# Patient Record
Sex: Male | Born: 1955 | Race: Black or African American | Hispanic: No | Marital: Single | Smoking: Current some day smoker
Health system: Southern US, Community
[De-identification: ages and names within clinical notes are randomized; demographics above are authoritative.]

## PROBLEM LIST (undated history)

## (undated) DIAGNOSIS — R51 Headache: Principal | ICD-10-CM

## (undated) DIAGNOSIS — F1010 Alcohol abuse, uncomplicated: Secondary | ICD-10-CM

## (undated) DIAGNOSIS — R519 Headache, unspecified: Secondary | ICD-10-CM

## (undated) DIAGNOSIS — I10 Essential (primary) hypertension: Secondary | ICD-10-CM

## (undated) DIAGNOSIS — F1410 Cocaine abuse, uncomplicated: Secondary | ICD-10-CM

## (undated) DIAGNOSIS — I639 Cerebral infarction, unspecified: Secondary | ICD-10-CM

## (undated) DIAGNOSIS — Z8679 Personal history of other diseases of the circulatory system: Secondary | ICD-10-CM

## (undated) DIAGNOSIS — Z789 Other specified health status: Secondary | ICD-10-CM

## (undated) HISTORY — DX: Essential (primary) hypertension: I10

## (undated) HISTORY — DX: Cerebral infarction, unspecified: I63.9

## (undated) HISTORY — PX: VENTRICULOPERITONEAL SHUNT: SHX204

## (undated) HISTORY — PX: OTHER SURGICAL HISTORY: SHX169

## (undated) HISTORY — DX: Personal history of other diseases of the circulatory system: Z86.79

## (undated) HISTORY — PX: CRANIOTOMY: SHX93

## (undated) HISTORY — DX: Headache, unspecified: R51.9

## (undated) HISTORY — DX: Headache: R51

## (undated) HISTORY — PX: CAROTID ENDARTERECTOMY: SUR193

---

## 2010-11-05 ENCOUNTER — Emergency Department (HOSPITAL_COMMUNITY): Payer: BC Managed Care – PPO

## 2010-11-05 ENCOUNTER — Emergency Department (HOSPITAL_COMMUNITY)
Admission: EM | Admit: 2010-11-05 | Discharge: 2010-11-06 | Disposition: A | Discharge status: Other Acute Inpatient Hospital | Payer: BC Managed Care – PPO | Attending: Emergency Medicine | Admitting: Emergency Medicine

## 2010-11-05 DIAGNOSIS — IMO0002 Reserved for concepts with insufficient information to code with codable children: Secondary | ICD-10-CM | POA: Insufficient documentation

## 2010-11-05 DIAGNOSIS — F1010 Alcohol abuse, uncomplicated: Secondary | ICD-10-CM | POA: Insufficient documentation

## 2010-11-05 DIAGNOSIS — S02609B Fracture of mandible, unspecified, initial encounter for open fracture: Secondary | ICD-10-CM | POA: Insufficient documentation

## 2010-11-05 DIAGNOSIS — F29 Unspecified psychosis not due to a substance or known physiological condition: Secondary | ICD-10-CM | POA: Insufficient documentation

## 2010-11-05 DIAGNOSIS — I671 Cerebral aneurysm, nonruptured: Secondary | ICD-10-CM | POA: Insufficient documentation

## 2010-11-05 DIAGNOSIS — R4182 Altered mental status, unspecified: Secondary | ICD-10-CM | POA: Insufficient documentation

## 2010-11-05 DIAGNOSIS — I609 Nontraumatic subarachnoid hemorrhage, unspecified: Secondary | ICD-10-CM | POA: Insufficient documentation

## 2010-11-05 DIAGNOSIS — R296 Repeated falls: Secondary | ICD-10-CM | POA: Insufficient documentation

## 2010-11-05 DIAGNOSIS — S0180XA Unspecified open wound of other part of head, initial encounter: Secondary | ICD-10-CM | POA: Insufficient documentation

## 2010-11-05 DIAGNOSIS — R6884 Jaw pain: Secondary | ICD-10-CM | POA: Insufficient documentation

## 2010-11-05 DIAGNOSIS — R51 Headache: Secondary | ICD-10-CM | POA: Insufficient documentation

## 2010-11-05 LAB — URINALYSIS, ROUTINE W REFLEX MICROSCOPIC
Bilirubin Urine: NEGATIVE
Ketones, ur: NEGATIVE mg/dL
Leukocytes, UA: NEGATIVE
Nitrite: NEGATIVE
Protein, ur: NEGATIVE mg/dL
Urobilinogen, UA: 0.2 mg/dL (ref 0.0–1.0)
pH: 5.5 (ref 5.0–8.0)

## 2010-11-05 LAB — RAPID URINE DRUG SCREEN, HOSP PERFORMED
Amphetamines: NOT DETECTED
Barbiturates: NOT DETECTED
Tetrahydrocannabinol: NOT DETECTED

## 2010-11-06 ENCOUNTER — Emergency Department (HOSPITAL_COMMUNITY): Payer: BC Managed Care – PPO

## 2010-11-06 ENCOUNTER — Encounter (HOSPITAL_COMMUNITY): Payer: Self-pay

## 2010-11-06 LAB — DIFFERENTIAL
Basophils Absolute: 0 10*3/uL (ref 0.0–0.1)
Basophils Relative: 0 % (ref 0–1)
Lymphocytes Relative: 13 % (ref 12–46)
Monocytes Absolute: 1.3 10*3/uL — ABNORMAL HIGH (ref 0.1–1.0)
Neutro Abs: 14.3 10*3/uL — ABNORMAL HIGH (ref 1.7–7.7)
Neutrophils Relative %: 80 % — ABNORMAL HIGH (ref 43–77)

## 2010-11-06 LAB — COMPREHENSIVE METABOLIC PANEL
ALT: 20 U/L (ref 0–53)
Albumin: 3.7 g/dL (ref 3.5–5.2)
Alkaline Phosphatase: 93 U/L (ref 39–117)
BUN: 20 mg/dL (ref 6–23)
Chloride: 97 mEq/L (ref 96–112)
GFR calc Af Amer: >60 mL/min (ref >60)
Glucose, Bld: 127 mg/dL — ABNORMAL HIGH (ref 70–99)
Potassium: 4.1 mEq/L (ref 3.5–5.1)
Sodium: 137 mEq/L (ref 135–145)
Total Bilirubin: 0.2 mg/dL — ABNORMAL LOW (ref 0.3–1.2)
Total Protein: 8.4 g/dL — ABNORMAL HIGH (ref 6.0–8.3)

## 2010-11-06 LAB — CBC
HCT: 41.4 % (ref 39.0–52.0)
Hemoglobin: 14.5 g/dL (ref 13.0–17.0)
MCHC: 35 g/dL (ref 30.0–36.0)
WBC: 17.9 10*3/uL — ABNORMAL HIGH (ref 4.0–10.5)

## 2010-11-06 LAB — BLOOD GAS, ARTERIAL
Bicarbonate: 23.1 mEq/L (ref 20.0–24.0)
FIO2: 1 %
Patient temperature: 98.6
TCO2: 20.5 mmol/L (ref 0–100)
pCO2 arterial: 44.4 mmHg (ref 35.0–45.0)
pCO2 arterial: 36.3 mmHg (ref 35.0–45.0)
pH, Arterial: 7.404 (ref 7.350–7.450)
pH, Arterial: 7.42 (ref 7.350–7.450)
pO2, Arterial: 526 mmHg — ABNORMAL HIGH (ref 80.0–100.0)

## 2010-11-06 LAB — APTT: aPTT: 31 seconds (ref 24–37)

## 2010-11-06 LAB — PROTIME-INR: INR: 0.93 (ref 0.00–1.49)

## 2010-12-31 ENCOUNTER — Emergency Department (HOSPITAL_COMMUNITY)
Admission: EM | Admit: 2010-12-31 | Discharge: 2011-01-01 | Disposition: A | Discharge status: Home / Self Care | Payer: BC Managed Care – PPO | Attending: Emergency Medicine | Admitting: Emergency Medicine

## 2010-12-31 DIAGNOSIS — I10 Essential (primary) hypertension: Secondary | ICD-10-CM | POA: Insufficient documentation

## 2010-12-31 DIAGNOSIS — T46905A Adverse effect of unspecified agents primarily affecting the cardiovascular system, initial encounter: Secondary | ICD-10-CM | POA: Insufficient documentation

## 2010-12-31 DIAGNOSIS — T783XXA Angioneurotic edema, initial encounter: Secondary | ICD-10-CM | POA: Insufficient documentation

## 2010-12-31 DIAGNOSIS — Z9889 Other specified postprocedural states: Secondary | ICD-10-CM | POA: Insufficient documentation

## 2013-02-18 ENCOUNTER — Ambulatory Visit: Payer: BC Managed Care – PPO

## 2013-03-23 ENCOUNTER — Ambulatory Visit: Payer: Self-pay

## 2014-06-19 ENCOUNTER — Telehealth (HOSPITAL_COMMUNITY): Payer: Self-pay | Admitting: Internal Medicine

## 2014-06-19 NOTE — Telephone Encounter (Signed)
Received records from [facility] for appointment on 07/07/14 with Dr Rennis GoldenHilty.  [facility] for Dr Blanchie DessertHilty's schedule on 07/07/14.  lp

## 2014-06-21 ENCOUNTER — Encounter: Payer: Self-pay | Admitting: Neurology

## 2014-06-22 ENCOUNTER — Encounter: Payer: Self-pay | Admitting: Neurology

## 2014-06-22 ENCOUNTER — Ambulatory Visit (INDEPENDENT_AMBULATORY_CARE_PROVIDER_SITE_OTHER): Payer: PRIVATE HEALTH INSURANCE | Admitting: Neurology

## 2014-06-22 VITALS — BP 171/101 | HR 61 | Ht 69.0 in | Wt 144.0 lb

## 2014-06-22 DIAGNOSIS — R51 Headache: Secondary | ICD-10-CM

## 2014-06-22 DIAGNOSIS — R519 Headache, unspecified: Secondary | ICD-10-CM

## 2014-06-22 DIAGNOSIS — Z8679 Personal history of other diseases of the circulatory system: Secondary | ICD-10-CM | POA: Diagnosis not present

## 2014-06-22 HISTORY — DX: Personal history of other diseases of the circulatory system: Z86.79

## 2014-06-22 HISTORY — DX: Headache, unspecified: R51.9

## 2014-06-22 MED ORDER — NORTRIPTYLINE HCL 10 MG PO CAPS: ORAL_CAPSULE | ORAL | Status: DC

## 2014-06-22 NOTE — Progress Notes (Signed)
Reason for visit: Headache  Referring physician: Vocational rehabilitation  Leonard Forbes is a 59 y.o. 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 The patient was transferred to [facility] for surgical therapy, he required a craniotomy with an aneurysm clipping procedure. At the time of the intracranial hemorrhage, the patient fell and fractured his left mandible requiring surgery. The patient also eventually required a VP shunt placement for obstructive hydrocephalus. Since the event, the patient apparently sustained a stroke following the aneurysm rupture. The patient has had minimal residual with numbness or weakness on the extremities, but he has had some cognitive impairment following the subarachnoid bleed. The patient also reports headaches that are frequent, he indicates that previously the headaches were occurring 4 or 5 times a week, but more recently over the last 1 or 2 months, the headaches are occurring 3 times a week. The headaches at times may last 4-5 days. He may have 8-10 headache free days a month at this point. He indicates that the headaches are usually in the left occipital area and spreading down to the left shoulder and neck. He describes a dull achy sensation, no sharp shooting pains. He does not have nausea or vomiting, he may have some slight dizziness, and he has some blurred vision. At times, the headaches are severe enough to keep him from functioning well. He takes ibuprofen for the headache. He does not have a primary care physician, and he is referred by vocational rehabilitation. 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 The patient denies any balance issues or difficulty controlling the bowels or the bladder.  Past Medical History  Diagnosis Date  . Headache   . Hypertension   . Stroke   . Headache  disorder 06/22/2014  . History of ruptured arterial aneurysm 06/22/2014    Past Surgical History  Procedure Laterality Date  . Craniotomy      with clipping  . Ventriculoperitoneal shunt    . Carotid endarterectomy    . Orif mandible Left     Family History  Problem Relation Age of Onset  . Multiple sclerosis Mother   . Dementia Father   . Stroke Sister     Social history:  reports that he has been smoking.  He has never used smokeless tobacco. He reports that he drinks alcohol. He reports that he does not use illicit drugs.  Medications:  Prior to Admission medications   Medication Sig Start Date End Date Taking? Authorizing Provider  ibuprofen (ADVIL,MOTRIN) 200 MG tablet Take 200 mg by mouth every 6 (six) hours as needed.    Historical Provider, MD      Allergies  Allergen Reactions  . Aspirin Other (See Comments)    Stomach ulcer    ROS:  Out of a complete 14 system review of symptoms, the patient complains only of the following symptoms, and all other reviewed systems are negative.  Headache Memory problems Shoulder pain  Blood pressure 171/101, pulse 61, height  (1.753 m), weight 144 lb (65.318 kg).  Physical Exam  General: The patient is alert and cooperative at the time of the examination.  Eyes: Pupils are equal, round, and reactive to light. Discs are flat bilaterally.  Neck: The neck is supple, no carotid bruits are noted.  Respiratory: The respiratory examination is clear.  Cardiovascular: The cardiovascular  examination reveals a regular rate and rhythm, no obvious murmurs or rubs are noted.  Neuromuscular: Range of movement of the cervical spine is full.  Skin: Extremities are without significant edema.  Neurologic Exam  Mental status: The patient is alert and oriented x 3 at the time of the examination. The patient has apparent normal recent and remote memory, with an apparently normal attention span and concentration ability.  Mini-Mental Status Examination done today shows a total score of 30/30.  Cranial nerves: Facial symmetry is present. There is good sensation of the face to pinprick and soft touch bilaterally. The strength of the facial muscles and the muscles to head turning and shoulder shrug are normal bilaterally. Speech is well enunciated, no aphasia or dysarthria is noted. Extraocular movements are full. Visual fields are full. The tongue is midline, and the patient has symmetric elevation of the soft palate. No obvious hearing deficits are noted.  Motor: The motor testing reveals 5 over 5 strength of all 4 extremities. Good symmetric motor tone is noted throughout.  Sensory: Sensory testing is intact to pinprick, soft touch, vibration sensation, and position sense on all 4 extremities. No evidence of extinction is noted.  Coordination: Cerebellar testing reveals good finger-nose-finger and heel-to-shin bilaterally.  Gait and station: Gait is normal. Tandem gait is minimally unsteady. Romberg is negative. No drift is seen.  Reflexes: Deep tendon reflexes are symmetric and normal bilaterally. Toes are downgoing bilaterally.   CT head 11/06/10:  IMPRESSION: Large amount of subarachnoid hemorrhage. This is relatively symmetric. There is intraventricular hemorrhage with hydrocephalus.  This pattern could be seen with ruptured cerebral aneurysm. This could also be traumatic however would be somewhat unusual without subdural hemorrhage or external head trauma. Further evaluation with CTA head is suggested to evaluate for aneurysm.  * CT scan images were reviewed online. I agree with the written report.    Assessment/Plan:  1. History of subarachnoid hemorrhage, right PCOM aneurysm  2. Headaches, left shoulder pain  3. Cognitive impairment following subarachnoid average  The patient has minimal physical deficits, but he likely has sustained some cognitive changes following the subarachnoid  hemorrhage. He also reports problems with frequent headaches that are impairing his ability to function. The patient is taking ibuprofen if needed for the headache. He will be placed on nortriptyline in low dose, gradually increasing the dosing. If the headaches do not improve, a repeat CT scan of the brain may be done. The patient will follow-up in 3 months.  Marlan Palau. Keith Willis MD 06/22/2014 9:56 PM  [facility] [address] [place] [id number]  Phone [phone] Fax [phone]

## 2014-06-22 NOTE — Patient Instructions (Signed)
Pamelor (nortriptyline) is an antidepressant medication that has many uses that may include headache, whiplash injuries, or for peripheral neuropathy pain. Side effects may include drowsiness, dry mouth, blurred vision, or constipation. As with any antidepressant medication, worsening depression may occur. If you had any significant side effects, please call our office. The full effects of this medication may take 7-10 days after starting the drug, or going up on the dose.    Headaches, Frequently Asked Questions MIGRAINE HEADACHES Q: What is migraine? What causes it? How can I treat it? A: Generally, migraine headaches begin as a dull ache. Then they develop into a constant, throbbing, and pulsating pain. You may experience pain at the temples. You may experience pain at the front or back of one or both sides of the head. The pain is usually accompanied by a combination of:  Nausea.  Vomiting.  Sensitivity to light and noise. Some people (about 15%) experience an aura (see below) before an attack. The cause of migraine is believed to be chemical reactions in the brain. Treatment for migraine may include over-the-counter or prescription medications. It may also include self-help techniques. These include relaxation training and biofeedback.  Q: What is an aura? A: About 15% of people with migraine get an "aura". This is a sign of neurological symptoms that occur before a migraine headache. You may see wavy or jagged lines, dots, or flashing lights. You might experience tunnel vision or blind spots in one or both eyes. The aura can include visual or auditory hallucinations (something imagined). It may include disruptions in smell (such as strange odors), taste or touch. Other symptoms include:  Numbness.  A "pins and needles" sensation.  Difficulty in recalling or speaking the correct word. These neurological events may last as long as 60 minutes. These symptoms will fade as the headache  begins. Q: What is a trigger? A: Certain physical or environmental factors can lead to or "trigger" a migraine. These include:  Foods.  Hormonal changes.  Weather.  Stress. It is important to remember that triggers are different for everyone. To help prevent migraine attacks, you need to figure out which triggers affect you. Keep a headache diary. This is a good way to track triggers. The diary will help you talk to your healthcare professional about your condition. Q: Does weather affect migraines? A: Bright sunshine, hot, humid conditions, and drastic changes in barometric pressure may lead to, or "trigger," a migraine attack in some people. But studies have shown that weather does not act as a trigger for everyone with migraines. Q: What is the link between migraine and hormones? A: Hormones start and regulate many of your body's functions. Hormones keep your body in balance within a constantly changing environment. The levels of hormones in your body are unbalanced at times. Examples are during menstruation, pregnancy, or menopause. That can lead to a migraine attack. In fact, about three quarters of all women with migraine report that their attacks are related to the menstrual cycle.  Q: Is there an increased risk of stroke for migraine sufferers? A: The likelihood of a migraine attack causing a stroke is very remote. That is not to say that migraine sufferers cannot have a stroke associated with their migraines. In persons under age 40, the most common associated factor for stroke is migraine headache. But over the course of a person's normal life span, the occurrence of migraine headache may actually be associated with a reduced risk of dying from cerebrovascular disease due to   stroke.  Q: What are acute medications for migraine? A: Acute medications are used to treat the pain of the headache after it has started. Examples over-the-counter medications, NSAIDs, ergots, and triptans.  Q:  What are the triptans? A: Triptans are the newest class of abortive medications. They are specifically targeted to treat migraine. Triptans are vasoconstrictors. They moderate some chemical reactions in the brain. The triptans work on receptors in your brain. Triptans help to restore the balance of a neurotransmitter called serotonin. Fluctuations in levels of serotonin are thought to be a main cause of migraine.  Q: Are over-the-counter medications for migraine effective? A: Over-the-counter, or "OTC," medications may be effective in relieving mild to moderate pain and associated symptoms of migraine. But you should see your caregiver before beginning any treatment regimen for migraine.  Q: What are preventive medications for migraine? A: Preventive medications for migraine are sometimes referred to as "prophylactic" treatments. They are used to reduce the frequency, severity, and length of migraine attacks. Examples of preventive medications include antiepileptic medications, antidepressants, beta-blockers, calcium channel blockers, and NSAIDs (nonsteroidal anti-inflammatory drugs). Q: Why are anticonvulsants used to treat migraine? A: During the past few years, there has been an increased interest in antiepileptic drugs for the prevention of migraine. They are sometimes referred to as "anticonvulsants". Both epilepsy and migraine may be caused by similar reactions in the brain.  Q: Why are antidepressants used to treat migraine? A: Antidepressants are typically used to treat people with depression. They may reduce migraine frequency by regulating chemical levels, such as serotonin, in the brain.  Q: What alternative therapies are used to treat migraine? A: The term "alternative therapies" is often used to describe treatments considered outside the scope of conventional Western medicine. Examples of alternative therapy include acupuncture, acupressure, and yoga. Another common alternative treatment is  herbal therapy. Some herbs are believed to relieve headache pain. Always discuss alternative therapies with your caregiver before proceeding. Some herbal products contain arsenic and other toxins. TENSION HEADACHES Q: What is a tension-type headache? What causes it? How can I treat it? A: Tension-type headaches occur randomly. They are often the result of temporary stress, anxiety, fatigue, or anger. Symptoms include soreness in your temples, a tightening band-like sensation around your head (a "vice-like" ache). Symptoms can also include a pulling feeling, pressure sensations, and contracting head and neck muscles. The headache begins in your forehead, temples, or the back of your head and neck. Treatment for tension-type headache may include over-the-counter or prescription medications. Treatment may also include self-help techniques such as relaxation training and biofeedback. CLUSTER HEADACHES Q: What is a cluster headache? What causes it? How can I treat it? A: Cluster headache gets its name because the attacks come in groups. The pain arrives with little, if any, warning. It is usually on one side of the head. A tearing or bloodshot eye and a runny nose on the same side of the headache may also accompany the pain. Cluster headaches are believed to be caused by chemical reactions in the brain. They have been described as the most severe and intense of any headache type. Treatment for cluster headache includes prescription medication and oxygen. SINUS HEADACHES Q: What is a sinus headache? What causes it? How can I treat it? A: When a cavity in the bones of the face and skull (a sinus) becomes inflamed, the inflammation will cause localized pain. This condition is usually the result of an allergic reaction, a tumor, or an infection. If your   headache is caused by a sinus blockage, such as an infection, you will probably have a fever. An x-ray will confirm a sinus blockage. Your caregiver's treatment  might include antibiotics for the infection, as well as antihistamines or decongestants.  REBOUND HEADACHES Q: What is a rebound headache? What causes it? How can I treat it? A: A pattern of taking acute headache medications too often can lead to a condition known as "rebound headache." A pattern of taking too much headache medication includes taking it more than 2 days per week or in excessive amounts. That means more than the label or a caregiver advises. With rebound headaches, your medications not only stop relieving pain, they actually begin to cause headaches. Doctors treat rebound headache by tapering the medication that is being overused. Sometimes your caregiver will gradually substitute a different type of treatment or medication. Stopping may be a challenge. Regularly overusing a medication increases the potential for serious side effects. Consult a caregiver if you regularly use headache medications more than 2 days per week or more than the label advises. ADDITIONAL QUESTIONS AND ANSWERS Q: What is biofeedback? A: Biofeedback is a self-help treatment. Biofeedback uses special equipment to monitor your body's involuntary physical responses. Biofeedback monitors:  Breathing.  Pulse.  Heart rate.  Temperature.  Muscle tension.  Brain activity. Biofeedback helps you refine and perfect your relaxation exercises. You learn to control the physical responses that are related to stress. Once the technique has been mastered, you do not need the equipment any more. Q: Are headaches hereditary? A: Four out of five (80%) of people that suffer report a family history of migraine. Scientists are not sure if this is genetic or a family predisposition. Despite the uncertainty, a child has a 50% chance of having migraine if one parent suffers. The child has a 75% chance if both parents suffer.  Q: Can children get headaches? A: By the time they reach high school, most young people have experienced  some type of headache. Many safe and effective approaches or medications can prevent a headache from occurring or stop it after it has begun.  Q: What type of doctor should I see to diagnose and treat my headache? A: Start with your primary caregiver. Discuss his or her experience and approach to headaches. Discuss methods of classification, diagnosis, and treatment. Your caregiver may decide to recommend you to a headache specialist, depending upon your symptoms or other physical conditions. Having diabetes, allergies, etc., may require a more comprehensive and inclusive approach to your headache. The National Headache Foundation will provide, upon request, a list of NHF physician members in your state. Document Released: 06/14/2003 Document Revised: 06/16/2011 Document Reviewed: 11/22/2007 ExitCare Patient Information 2015 [employer]. This information is not intended to replace advice given to you by your health care provider. Make sure you discuss any questions you have with your health care provider.  

## 2014-06-23 ENCOUNTER — Telehealth: Payer: Self-pay | Admitting: Neurology

## 2014-06-23 NOTE — Telephone Encounter (Signed)
Lupita LeashDonna can you send the new patient office visit to vocational rehabilitation.  You may need to call patient to get their phone and fax numbers.  Thank you.

## 2014-06-23 NOTE — Telephone Encounter (Signed)
-----   Message from York Spanielharles K Willis, MD sent at 06/22/2014  9:59 PM EDT ----- This new patient report needs to be sent to vocational rehabilitation. I do not know the fax number.

## 2014-06-27 NOTE — Telephone Encounter (Signed)
Records faxed to vocational rehab 06-26-14.

## 2014-07-07 ENCOUNTER — Ambulatory Visit (INDEPENDENT_AMBULATORY_CARE_PROVIDER_SITE_OTHER): Payer: Self-pay | Admitting: Internal Medicine

## 2014-07-07 ENCOUNTER — Encounter: Payer: Self-pay | Admitting: Internal Medicine

## 2014-07-07 VITALS — BP 140/96 | HR 66 | Ht 69.75 in | Wt 143.6 lb

## 2014-07-07 DIAGNOSIS — R590 Localized enlarged lymph nodes: Secondary | ICD-10-CM

## 2014-07-07 DIAGNOSIS — I10 Essential (primary) hypertension: Secondary | ICD-10-CM

## 2014-07-07 DIAGNOSIS — R599 Enlarged lymph nodes, unspecified: Secondary | ICD-10-CM

## 2014-07-07 DIAGNOSIS — R591 Generalized enlarged lymph nodes: Secondary | ICD-10-CM

## 2014-07-07 MED ORDER — LISINOPRIL 20 MG PO TABS: 20.0000 mg | ORAL_TABLET | Freq: Every day | ORAL | Status: DC

## 2014-07-07 NOTE — Progress Notes (Signed)
OFFICE NOTE  Chief Complaint:  Headaches  Primary Care Physician: No PCP Per Patient  HPI:  Leonard Forbes is a 59 year old male who was referred from vocational rehabilitation for evaluation. It is not exactly clear why he is here. He does have a history of hypertension and was followed by Dr. Concepcion Elk. He takes several antihypertensives but claims he has no money, no health insurance and had his house taken away from him. He is currently living with a friend and has no income. He says he can't afford to take any medications. He was seen by Dr. Anne Hahn in neurology and prescribed amitriptyline for headaches, but he never got the prescription filled due to cost. He is noted now to have elevated blood pressure, which was previously seen. In addition he has a complaint today about an enlarged mass in the left neck under the angle of the jaw. He denies any weight loss, night sweats, fevers or chills. He's had no dental pain and has had some tooth extractions in the past.  PMHx:  Past Medical History  Diagnosis Date  . Headache   . Hypertension   . Stroke   . Headache disorder 06/22/2014  . History of ruptured arterial aneurysm 06/22/2014    Past Surgical History  Procedure Laterality Date  . Craniotomy      with clipping  . Ventriculoperitoneal shunt    . Carotid endarterectomy    . Orif mandible Left     FAMHx:  Family History  Problem Relation Age of Onset  . Multiple sclerosis Mother   . Dementia Father   . Stroke Sister     SOCHx:   reports that he has been smoking.  He has never used smokeless tobacco. He reports that he drinks alcohol. He reports that he does not use illicit drugs.  ALLERGIES:  Allergies  Allergen Reactions  . Aspirin Other (See Comments)    Stomach ulcer    ROS: A comprehensive review of systems was negative except for: Ears, nose, mouth, throat, and face: positive for Neck mass Neurological: positive for headaches  HOME MEDS: Current  Outpatient Prescriptions  Medication Sig Dispense Refill  . cyclobenzaprine (FLEXERIL) 10 MG tablet Take 10 mg by mouth 3 (three) times daily as needed for muscle spasms.    Marland Kitchen ibuprofen (ADVIL,MOTRIN) 200 MG tablet Take 200 mg by mouth every 6 (six) hours as needed.    Marland Kitchen lisinopril (PRINIVIL,ZESTRIL) 20 MG tablet Take 1 tablet (20 mg total) by mouth daily. 30 tablet 6   No current facility-administered medications for this visit.    LABS/IMAGING: No results found for this or any previous visit (from the past 48 hour(s)). No results found.  WEIGHTS: Wt Readings from Last 3 Encounters:  07/07/14 143 lb 9.6 oz (65.137 kg)  06/22/14 144 lb (65.318 kg)    VITALS: BP 140/96 mmHg  Pulse 66  Ht 5' 9.75" (1.772 m)  Wt 143 lb 9.6 oz (65.137 kg)  BMI 20.74 kg/m2  EXAM: General appearance: alert and no distress Neck: marked anterior cervical adenopathy, no carotid bruit and no JVD Lungs: clear to auscultation bilaterally Heart: regular rate and rhythm, S1, S2 normal, no murmur, click, rub or gallop Abdomen: soft, non-tender; bowel sounds normal; no masses,  no organomegaly and scaphoid Extremities: extremities normal, atraumatic, no cyanosis or edema Pulses: 2+ and symmetric Skin: Skin color, texture, turgor normal. No rashes or lesions Neurologic: Grossly normal Psych: Normal  EKG: Normal sinus rhythm at 66, nonspecific T wave  changes, more inferiorly  ASSESSMENT: 1. Hypertension-uncontrolled 2. Headache secondary to prior ruptured cerebral aneurysm 3. Enlarged left submandibular lymph node  PLAN: 1.   Leonard Forbes presents for what he says is a checkup at the request of vocational rehabilitation. He's previously been referred to neurology before and was prescribed amitriptyline, but has not taken the medicine due to lack of financial resources. He says that he previously applied for Medicaid but was denied. It's difficult to understand how his blood pressure could be treated or his  headaches could be managed without being able to afford the medicines he needs to take it. I'm again recommending that he go back on blood pressure medication, namely lisinopril 20 mg daily. In addition he pointed out an enlarged mass on his left neck. Examination reveals what I believe to be a firm, enlarged lymph node. It also could be an abscess. I would recommend an ultrasound of the head and neck, specifically in this area, to further evaluate. Based on the findings he may need to be referred to an ear nose and throat specialist.  He clearly needs referral to social services to establish some type of health insurance or coverage so that he can take medications. I think he can follow-up with his primary care provider at the [facility] for his hypertension.  Chrystie NoseKenneth C. Antwaun Buth, MD, [facility] Attending Cardiologist [facility]  Madisynn Plair C 07/07/2014, 4:51 PM

## 2014-07-07 NOTE — Patient Instructions (Addendum)
Dr. Rennis GoldenHilty has ordered a ultrasound of your head/neck to be done at [facility].   Please contact [phone] for financial assistance There will be some prompted questions for you to go through/answer.    Please starts lisinopril 20mg  once daily for blood pressure This prescription has been sent to the [facility] on [address] It costs $4 for a 30 day supply  Please follow up with Dr. Rennis GoldenHilty as needed.

## 2014-07-14 ENCOUNTER — Ambulatory Visit (HOSPITAL_COMMUNITY): Payer: Self-pay

## 2014-07-18 ENCOUNTER — Ambulatory Visit (HOSPITAL_COMMUNITY)
Admission: RE | Admit: 2014-07-18 | Discharge: 2014-07-18 | Disposition: A | Discharge status: Home / Self Care | Payer: Self-pay | Source: Ambulatory Visit | Attending: Internal Medicine | Admitting: Internal Medicine

## 2014-07-18 DIAGNOSIS — R599 Enlarged lymph nodes, unspecified: Secondary | ICD-10-CM | POA: Insufficient documentation

## 2014-07-25 ENCOUNTER — Other Ambulatory Visit: Payer: Self-pay | Admitting: *Deleted

## 2014-07-25 ENCOUNTER — Telehealth: Payer: Self-pay | Admitting: Internal Medicine

## 2014-07-25 ENCOUNTER — Telehealth: Payer: Self-pay | Admitting: *Deleted

## 2014-07-25 DIAGNOSIS — R221 Localized swelling, mass and lump, neck: Secondary | ICD-10-CM

## 2014-07-25 DIAGNOSIS — Z01812 Encounter for preprocedural laboratory examination: Secondary | ICD-10-CM

## 2014-07-25 NOTE — Telephone Encounter (Signed)
Pt. Informed about his most recent ultrasound and that Dr. Rennis GoldenHilty wants him to have a CT of his neck to evaluate the mass that was identified , pt. Also informed that he will need to get blood work before the CT is done. Pt. Stated understanding of these instructions.  I will put the order in for the CT and the bun/creat

## 2014-07-25 NOTE — Telephone Encounter (Signed)
Pt. Called , [facility]

## 2014-07-25 NOTE — Telephone Encounter (Signed)
[name] is calling to get his results from his ultasound he had 2wks ago . Please call    Thanks

## 2014-07-31 ENCOUNTER — Encounter (HOSPITAL_COMMUNITY): Payer: Self-pay

## 2014-07-31 ENCOUNTER — Ambulatory Visit (HOSPITAL_COMMUNITY)
Admission: RE | Admit: 2014-07-31 | Discharge: 2014-07-31 | Disposition: A | Discharge status: Home / Self Care | Payer: Self-pay | Source: Ambulatory Visit | Attending: Internal Medicine | Admitting: Internal Medicine

## 2014-07-31 DIAGNOSIS — M5032 Other cervical disc degeneration, mid-cervical region: Secondary | ICD-10-CM | POA: Insufficient documentation

## 2014-07-31 DIAGNOSIS — Z982 Presence of cerebrospinal fluid drainage device: Secondary | ICD-10-CM | POA: Insufficient documentation

## 2014-07-31 DIAGNOSIS — R221 Localized swelling, mass and lump, neck: Secondary | ICD-10-CM | POA: Insufficient documentation

## 2014-07-31 LAB — POCT ACTIVATED CLOTTING TIME: ACTIVATED CLOTTING TIME: 122 s

## 2014-07-31 LAB — POCT I-STAT CREATININE: CREATININE: 1.4 mg/dL — AB (ref 0.50–1.35)

## 2014-07-31 MED ORDER — IOHEXOL 300 MG/ML  SOLN
75.0000 mL | Freq: Once | INTRAMUSCULAR | Status: AC | PRN
  Administered 2014-07-31: 75 mL via INTRAVENOUS

## 2014-08-02 ENCOUNTER — Other Ambulatory Visit: Payer: Self-pay | Admitting: *Deleted

## 2014-08-02 DIAGNOSIS — R221 Localized swelling, mass and lump, neck: Secondary | ICD-10-CM

## 2014-09-22 ENCOUNTER — Ambulatory Visit: Payer: PRIVATE HEALTH INSURANCE | Admitting: Nurse Practitioner

## 2014-09-25 ENCOUNTER — Encounter: Payer: Self-pay | Admitting: Nurse Practitioner

## 2014-09-27 ENCOUNTER — Telehealth: Payer: Self-pay | Admitting: *Deleted

## 2014-09-27 NOTE — Telephone Encounter (Signed)
LMVM for pt or wife to return call to reschedule missed appt (09-22-14).

## 2016-03-25 ENCOUNTER — Encounter (HOSPITAL_COMMUNITY): Payer: Self-pay | Admitting: *Deleted

## 2016-03-25 ENCOUNTER — Emergency Department (HOSPITAL_COMMUNITY): Payer: Medicare Other

## 2016-03-25 ENCOUNTER — Ambulatory Visit (INDEPENDENT_AMBULATORY_CARE_PROVIDER_SITE_OTHER)
Admission: EM | Admit: 2016-03-25 | Discharge: 2016-03-25 | Disposition: A | Discharge status: Nursing Home (Includes ICF) | Payer: Medicare Other | Source: Home / Self Care | Attending: Family Medicine | Admitting: Family Medicine

## 2016-03-25 ENCOUNTER — Encounter (HOSPITAL_COMMUNITY): Payer: Self-pay | Admitting: Emergency Medicine

## 2016-03-25 ENCOUNTER — Inpatient Hospital Stay (HOSPITAL_COMMUNITY)
Admission: EM | Admit: 2016-03-25 | Discharge: 2016-04-09 | DRG: 896 | Disposition: A | Discharge status: Skilled Nursing Facility | Payer: Medicare Other | Attending: Internal Medicine | Admitting: Internal Medicine

## 2016-03-25 DIAGNOSIS — K0889 Other specified disorders of teeth and supporting structures: Secondary | ICD-10-CM | POA: Diagnosis present

## 2016-03-25 DIAGNOSIS — G9341 Metabolic encephalopathy: Secondary | ICD-10-CM | POA: Diagnosis present

## 2016-03-25 DIAGNOSIS — I959 Hypotension, unspecified: Secondary | ICD-10-CM | POA: Diagnosis present

## 2016-03-25 DIAGNOSIS — F17200 Nicotine dependence, unspecified, uncomplicated: Secondary | ICD-10-CM | POA: Diagnosis present

## 2016-03-25 DIAGNOSIS — K1379 Other lesions of oral mucosa: Secondary | ICD-10-CM | POA: Diagnosis not present

## 2016-03-25 DIAGNOSIS — E876 Hypokalemia: Secondary | ICD-10-CM | POA: Diagnosis not present

## 2016-03-25 DIAGNOSIS — F1423 Cocaine dependence with withdrawal: Secondary | ICD-10-CM | POA: Diagnosis present

## 2016-03-25 DIAGNOSIS — Y92230 Patient room in hospital as the place of occurrence of the external cause: Secondary | ICD-10-CM | POA: Diagnosis not present

## 2016-03-25 DIAGNOSIS — E440 Moderate protein-calorie malnutrition: Secondary | ICD-10-CM | POA: Insufficient documentation

## 2016-03-25 DIAGNOSIS — F10239 Alcohol dependence with withdrawal, unspecified: Secondary | ICD-10-CM | POA: Diagnosis not present

## 2016-03-25 DIAGNOSIS — R55 Syncope and collapse: Secondary | ICD-10-CM

## 2016-03-25 DIAGNOSIS — F10939 Alcohol use, unspecified with withdrawal, unspecified: Secondary | ICD-10-CM | POA: Diagnosis present

## 2016-03-25 DIAGNOSIS — R4182 Altered mental status, unspecified: Secondary | ICD-10-CM | POA: Diagnosis not present

## 2016-03-25 DIAGNOSIS — Z8673 Personal history of transient ischemic attack (TIA), and cerebral infarction without residual deficits: Secondary | ICD-10-CM

## 2016-03-25 DIAGNOSIS — E860 Dehydration: Secondary | ICD-10-CM | POA: Diagnosis present

## 2016-03-25 DIAGNOSIS — Z820 Family history of epilepsy and other diseases of the nervous system: Secondary | ICD-10-CM

## 2016-03-25 DIAGNOSIS — F10231 Alcohol dependence with withdrawal delirium: Secondary | ICD-10-CM | POA: Diagnosis present

## 2016-03-25 DIAGNOSIS — R64 Cachexia: Secondary | ICD-10-CM | POA: Diagnosis present

## 2016-03-25 DIAGNOSIS — N179 Acute kidney failure, unspecified: Secondary | ICD-10-CM | POA: Diagnosis present

## 2016-03-25 DIAGNOSIS — F10230 Alcohol dependence with withdrawal, uncomplicated: Secondary | ICD-10-CM | POA: Diagnosis not present

## 2016-03-25 DIAGNOSIS — G9340 Encephalopathy, unspecified: Secondary | ICD-10-CM | POA: Diagnosis not present

## 2016-03-25 DIAGNOSIS — R609 Edema, unspecified: Secondary | ICD-10-CM | POA: Diagnosis not present

## 2016-03-25 DIAGNOSIS — Z982 Presence of cerebrospinal fluid drainage device: Secondary | ICD-10-CM | POA: Diagnosis not present

## 2016-03-25 DIAGNOSIS — I493 Ventricular premature depolarization: Secondary | ICD-10-CM | POA: Diagnosis present

## 2016-03-25 DIAGNOSIS — Y900 Blood alcohol level of less than 20 mg/100 ml: Secondary | ICD-10-CM | POA: Diagnosis present

## 2016-03-25 DIAGNOSIS — K1370 Unspecified lesions of oral mucosa: Secondary | ICD-10-CM

## 2016-03-25 DIAGNOSIS — Z886 Allergy status to analgesic agent status: Secondary | ICD-10-CM

## 2016-03-25 DIAGNOSIS — E872 Acidosis, unspecified: Secondary | ICD-10-CM | POA: Diagnosis present

## 2016-03-25 DIAGNOSIS — I472 Ventricular tachycardia: Secondary | ICD-10-CM | POA: Diagnosis present

## 2016-03-25 DIAGNOSIS — I10 Essential (primary) hypertension: Secondary | ICD-10-CM | POA: Diagnosis not present

## 2016-03-25 DIAGNOSIS — R7989 Other specified abnormal findings of blood chemistry: Secondary | ICD-10-CM | POA: Diagnosis present

## 2016-03-25 DIAGNOSIS — Z8679 Personal history of other diseases of the circulatory system: Secondary | ICD-10-CM

## 2016-03-25 DIAGNOSIS — I480 Paroxysmal atrial fibrillation: Secondary | ICD-10-CM | POA: Diagnosis not present

## 2016-03-25 DIAGNOSIS — W06XXXA Fall from bed, initial encounter: Secondary | ICD-10-CM | POA: Diagnosis not present

## 2016-03-25 DIAGNOSIS — T50901A Poisoning by unspecified drugs, medicaments and biological substances, accidental (unintentional), initial encounter: Secondary | ICD-10-CM | POA: Diagnosis not present

## 2016-03-25 DIAGNOSIS — I6203 Nontraumatic chronic subdural hemorrhage: Secondary | ICD-10-CM | POA: Diagnosis present

## 2016-03-25 DIAGNOSIS — R0600 Dyspnea, unspecified: Secondary | ICD-10-CM

## 2016-03-25 DIAGNOSIS — Z681 Body mass index (BMI) 19 or less, adult: Secondary | ICD-10-CM

## 2016-03-25 DIAGNOSIS — F1410 Cocaine abuse, uncomplicated: Secondary | ICD-10-CM

## 2016-03-25 DIAGNOSIS — R05 Cough: Secondary | ICD-10-CM

## 2016-03-25 DIAGNOSIS — G3184 Mild cognitive impairment, so stated: Secondary | ICD-10-CM | POA: Diagnosis present

## 2016-03-25 DIAGNOSIS — Z823 Family history of stroke: Secondary | ICD-10-CM

## 2016-03-25 DIAGNOSIS — R059 Cough, unspecified: Secondary | ICD-10-CM

## 2016-03-25 DIAGNOSIS — F1490 Cocaine use, unspecified, uncomplicated: Secondary | ICD-10-CM | POA: Diagnosis present

## 2016-03-25 DIAGNOSIS — I6200 Nontraumatic subdural hemorrhage, unspecified: Secondary | ICD-10-CM | POA: Diagnosis not present

## 2016-03-25 DIAGNOSIS — T39311A Poisoning by propionic acid derivatives, accidental (unintentional), initial encounter: Secondary | ICD-10-CM | POA: Diagnosis present

## 2016-03-25 DIAGNOSIS — E875 Hyperkalemia: Secondary | ICD-10-CM | POA: Diagnosis present

## 2016-03-25 LAB — MAGNESIUM: Magnesium: 1.9 mg/dL (ref 1.7–2.4)

## 2016-03-25 LAB — I-STAT TROPONIN, ED: TROPONIN I, POC: 0 ng/mL (ref 0.00–0.08)

## 2016-03-25 LAB — URINALYSIS, ROUTINE W REFLEX MICROSCOPIC
BILIRUBIN URINE: NEGATIVE
Bacteria, UA: NONE SEEN
Glucose, UA: NEGATIVE mg/dL
Hgb urine dipstick: NEGATIVE
Ketones, ur: 20 mg/dL — AB
LEUKOCYTES UA: NEGATIVE
NITRITE: NEGATIVE
PH: 5 (ref 5.0–8.0)
Protein, ur: 30 mg/dL — AB
SPECIFIC GRAVITY, URINE: 1.014 (ref 1.005–1.030)
Squamous Epithelial / LPF: NONE SEEN

## 2016-03-25 LAB — COMPREHENSIVE METABOLIC PANEL
ALT: 13 U/L — AB (ref 17–63)
AST: 19 U/L (ref 15–41)
Albumin: 4.3 g/dL (ref 3.5–5.0)
Alkaline Phosphatase: 95 U/L (ref 38–126)
Anion gap: 12 (ref 5–15)
BUN: 12 mg/dL (ref 6–20)
CALCIUM: 9.9 mg/dL (ref 8.9–10.3)
CO2: 23 mmol/L (ref 22–32)
CREATININE: 1.47 mg/dL — AB (ref 0.61–1.24)
Chloride: 108 mmol/L (ref 101–111)
GFR calc Af Amer: 58 mL/min — ABNORMAL LOW (ref >60)
GFR, EST NON AFRICAN AMERICAN: 50 mL/min — AB (ref >60)
Glucose, Bld: 112 mg/dL — ABNORMAL HIGH (ref 65–99)
POTASSIUM: 5.5 mmol/L — AB (ref 3.5–5.1)
Sodium: 143 mmol/L (ref 135–145)
Total Bilirubin: 1 mg/dL (ref 0.3–1.2)
Total Protein: 8.5 g/dL — ABNORMAL HIGH (ref 6.5–8.1)

## 2016-03-25 LAB — CBC WITH DIFFERENTIAL/PLATELET
BASOS ABS: 0 10*3/uL (ref 0.0–0.1)
Basophils Relative: 0 %
EOS ABS: 0 10*3/uL (ref 0.0–0.7)
Eosinophils Relative: 0 %
HCT: 48.6 % (ref 39.0–52.0)
Hemoglobin: 16.3 g/dL (ref 13.0–17.0)
LYMPHS ABS: 1.4 10*3/uL (ref 0.7–4.0)
Lymphocytes Relative: 9 %
MCH: 30 pg (ref 26.0–34.0)
MCHC: 33.5 g/dL (ref 30.0–36.0)
MCV: 89.5 fL (ref 78.0–100.0)
MONO ABS: 0.8 10*3/uL (ref 0.1–1.0)
Monocytes Relative: 5 %
Neutro Abs: 13 10*3/uL — ABNORMAL HIGH (ref 1.7–7.7)
Neutrophils Relative %: 86 %
PLATELETS: 355 10*3/uL (ref 150–400)
RBC: 5.43 MIL/uL (ref 4.22–5.81)
RDW: 16.2 % — AB (ref 11.5–15.5)
WBC: 15.2 10*3/uL — AB (ref 4.0–10.5)

## 2016-03-25 LAB — I-STAT CG4 LACTIC ACID, ED: LACTIC ACID, VENOUS: 3.26 mmol/L — AB (ref 0.5–1.9)

## 2016-03-25 LAB — ETHANOL

## 2016-03-25 LAB — ACETAMINOPHEN LEVEL: Acetaminophen (Tylenol), Serum: <10 ug/mL — ABNORMAL LOW (ref 10–30)

## 2016-03-25 LAB — RAPID URINE DRUG SCREEN, HOSP PERFORMED
AMPHETAMINES: NOT DETECTED
BENZODIAZEPINES: NOT DETECTED
Barbiturates: NOT DETECTED
COCAINE: POSITIVE — AB
Opiates: NOT DETECTED
Tetrahydrocannabinol: NOT DETECTED

## 2016-03-25 LAB — POTASSIUM: POTASSIUM: 4.7 mmol/L (ref 3.5–5.1)

## 2016-03-25 LAB — SALICYLATE LEVEL: Salicylate Lvl: <7 mg/dL (ref 2.8–30.0)

## 2016-03-25 MED ORDER — ONDANSETRON HCL 4 MG/2ML IJ SOLN: 4.0000 mg | Freq: Four times a day (QID) | INTRAMUSCULAR | Status: DC | PRN

## 2016-03-25 MED ORDER — SODIUM CHLORIDE 0.9 % IV BOLUS (SEPSIS)
1000.0000 mL | Freq: Once | INTRAVENOUS | Status: AC
  Administered 2016-03-25: 1000 mL via INTRAVENOUS

## 2016-03-25 MED ORDER — LORAZEPAM 2 MG/ML IJ SOLN
1.0000 mg | Freq: Once | INTRAMUSCULAR | Status: AC
  Administered 2016-03-25: 1 mg via INTRAVENOUS
  Filled 2016-03-25: qty 1

## 2016-03-25 MED ORDER — SODIUM CHLORIDE 0.9% FLUSH
3.0000 mL | Freq: Two times a day (BID) | INTRAVENOUS | Status: DC
  Administered 2016-03-26 – 2016-03-29 (×4): 3 mL via INTRAVENOUS

## 2016-03-25 MED ORDER — HALOPERIDOL LACTATE 5 MG/ML IJ SOLN
5.0000 mg | Freq: Once | INTRAMUSCULAR | Status: AC
  Administered 2016-03-25: 5 mg via INTRAVENOUS
  Filled 2016-03-25: qty 1

## 2016-03-25 MED ORDER — SODIUM CHLORIDE 0.9% FLUSH
3.0000 mL | Freq: Two times a day (BID) | INTRAVENOUS | Status: DC
  Administered 2016-03-26 – 2016-04-04 (×15): 3 mL via INTRAVENOUS

## 2016-03-25 MED ORDER — DIPHENHYDRAMINE HCL 50 MG/ML IJ SOLN
25.0000 mg | Freq: Once | INTRAMUSCULAR | Status: AC
  Administered 2016-03-25: 25 mg via INTRAVENOUS
  Filled 2016-03-25: qty 1

## 2016-03-25 MED ORDER — ENOXAPARIN SODIUM 40 MG/0.4ML ~~LOC~~ SOLN
40.0000 mg | SUBCUTANEOUS | Status: DC
  Administered 2016-03-26 – 2016-03-27 (×2): 40 mg via SUBCUTANEOUS
  Filled 2016-03-25 (×2): qty 0.4

## 2016-03-25 MED ORDER — LABETALOL HCL 5 MG/ML IV SOLN
10.0000 mg | Freq: Once | INTRAVENOUS | Status: AC
  Administered 2016-03-25: 10 mg via INTRAVENOUS
  Filled 2016-03-25: qty 4

## 2016-03-25 MED ORDER — LORAZEPAM 2 MG/ML IJ SOLN
INTRAMUSCULAR | Status: AC
  Filled 2016-03-25: qty 1

## 2016-03-25 MED ORDER — LORAZEPAM 2 MG/ML IJ SOLN: 1.0000 mg | Freq: Once | INTRAMUSCULAR | Status: DC

## 2016-03-25 MED ORDER — ONDANSETRON HCL 4 MG PO TABS: 4.0000 mg | ORAL_TABLET | Freq: Four times a day (QID) | ORAL | Status: DC | PRN

## 2016-03-25 NOTE — ED Notes (Signed)
Nurse will get labs 

## 2016-03-25 NOTE — ED Notes (Signed)
Upon shift change, this RN entered room to find pt agitated with staff members at bedside. Pt was kicking and throwing arms around, making so sense, disoriented, seems to be actively hallucinating responding to internal stimuli. Dansie PA and Schlossman MD also at bedside.

## 2016-03-25 NOTE — ED Notes (Signed)
Robb Matarrtiz MD (admitting) at bedside

## 2016-03-25 NOTE — ED Provider Notes (Signed)
CSN: [id number]     Arrival date & time 03/25/16  1351 History   First MD Initiated Contact with Patient 03/25/16 1405     Chief Complaint  Patient presents with  . Dental Pain   (Consider location/radiation/quality/duration/timing/severity/associated sxs/prior Treatment) 60 year old male presented to the urgent care complaining of mouth pain. He states in the last 3 hours or so he had taken 16 ibuprofen tablets. He had only been here a few minutes just long enough to get vital signs and as he stood up he dropped to the floor in a stuporous condition. He was placed on the exam table. Vital signs retaken. Vital signs stable. He quickly regained consciousness. IV started, O2 and monitor.      Past Medical History:  Diagnosis Date  . Headache   . Headache disorder 06/22/2014  . 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 Hypertension   . Stroke    Past Surgical History:  Procedure Laterality Date  . CAROTID ENDARTERECTOMY    . CRANIOTOMY     with clipping  . orif mandible Left   . VENTRICULOPERITONEAL SHUNT     Family History  Problem Relation Age of Onset  . Multiple sclerosis Mother   . Dementia Father   . Stroke Sister    Social History  Substance Use Topics  . Smoking status: Current Every Day Smoker  . Smokeless tobacco: Never Used  . Alcohol use 0.0 oz/week     Comment: drinks 3-4 oz of whiskey 6x a week and 3 12oz beers a week    Review of Systems  Unable to perform ROS: Acuity of condition  Constitutional: Positive for activity change and fatigue.  HENT:       Complaints of oral pain.  Respiratory: Negative.   Gastrointestinal: Negative.   Neurological: Negative.     Allergies  Aspirin  Home Medications   Prior to Admission medications   Medication Sig Start Date End Date Taking? Authorizing Provider  cyclobenzaprine (FLEXERIL) 10 MG tablet Take 10 mg by mouth 3 (three) times daily as needed for muscle spasms.    Historical Provider, MD   ibuprofen (ADVIL,MOTRIN) 200 MG tablet Take 200 mg by mouth every 6 (six) hours as needed.    Historical Provider, MD  lisinopril (PRINIVIL,ZESTRIL) 20 MG tablet Take 1 tablet (20 mg total) by mouth daily. 07/07/14   Chrystie NoseKenneth C Hilty, MD   Meds Ordered and Administered this Visit  Medications - No data to display  BP 144/96 Comment: post syncope  Pulse 66   Temp 97.9 F (36.6 C) (Oral)   Resp 16   SpO2 100%  No data found.   Physical Exam  Constitutional: He appears well-developed and well-nourished.  Eyes: EOM are normal.  Neck: Neck supple.  Cardiovascular: Normal rate.   Pulmonary/Chest: Effort normal.  Musculoskeletal: He exhibits no edema.  Neurological:  After initial apparent vasovagal reaction and temporary stupor the patient regain consciousness was able to answer questions appropriately and become oriented.  Skin: Skin is warm and dry.  Vitals reviewed.   Urgent Care Course   Clinical Course     Procedures (including critical care time)  Labs Review Labs Reviewed - No data to display  Imaging Review No results found.   Visual Acuity Review  Right Eye Distance:   Left Eye Distance:   Bilateral Distance:    Right Eye Near:   Left Eye Near:    Bilateral Near:         MDM  1. Mouth pain   2. Syncope and collapse   3. Drug overdose, accidental or unintentional, initial encounter    Patient being discharged via care  to  for overdose of ibuprofen and syncope and collapse in the urgent care. IV of normal saline, monitor and oxygen via nasal cannula.    Hayden Rasmussenavid Kieley Akter, NP 03/25/16 1429

## 2016-03-25 NOTE — ED Notes (Signed)
Poison control called to get update, informed them of recent VS and labs. No new orders at this time. They suggested we continue to monitor pt.

## 2016-03-25 NOTE — ED Notes (Signed)
Robb Matarrtiz MD has been paged about pt BP

## 2016-03-25 NOTE — ED Provider Notes (Signed)
[facility]Y DEPT Provider Note   CSN: [id number] Arrival date & time: 03/25/16  1455     History   Chief Complaint Chief Complaint  Patient presents with  . Mouth Injury    HPI Leonard Forbes is a 60 y.o. male.  Leonard Forbes is a 60 y.o. Male who presents to the ED via EMS from urgent care complaining of mouth pain for the past three days. Patient reports he was eating at all so hard shell when he feels like a piece of it hit the roof of his mouth causing lots of pain. He then reports last night he took 12-16 ibuprofen between 5pm-5am due to pain. He went to urgent care to get his mouth looked at and while there he passed out according to records. The patient reports he does not remember passing out. According to [facility] records the patient briefly lost consciousness after standing up. According to note patient stood up and became lightheaded and passed out.  Patient's only current complaint is mouth pain. He denies headache. He denies abdominal pain, nausea or vomiting. No hematemesis or hematochezia. Patient does have a history of hypertension and reports he has not taken his blood pressure medicine since 2013. He also tells me he is a smoker. He also drinks alcohol regularly. He denies SI.  Patient denies fevers, headache, numbness, tingling, weakness, abdominal pain, nausea, vomiting, diarrhea, chest pain, shortness of breath, hematemesis, hematochezia, or urinary symptoms.   The history is provided by the patient. No language interpreter was used.  Mouth Injury  Pertinent negatives include no chest pain, no abdominal pain, no headaches and no shortness of breath.    Past Medical History:  Diagnosis Date  . Headache   . Headache disorder 06/22/2014  . 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 Hypertension   . Stroke [facility])     Patient Active Problem List   Diagnosis Date Noted  . Alcohol withdrawal (HCC) 03/26/2016  . Cocaine use 03/26/2016  . Lactic acidosis 03/26/2016   . Hyperkalemia 03/26/2016  . Altered mental status 03/25/2016  . HTN (hypertension) 07/07/2014  . Localized enlarged lymph nodes 07/07/2014  . Headache disorder 06/22/2014  . 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 CAROTID ENDARTERECTOMY    . CRANIOTOMY     with clipping  . orif mandible Left   . VENTRICULOPERITONEAL SHUNT         Home Medications    Prior to Admission medications   Medication Sig Start Date End Date Taking? Authorizing Provider  lisinopril (PRINIVIL,ZESTRIL) 20 MG tablet Take 1 tablet (20 mg total) by mouth daily. Patient not taking: Reported on 03/25/2016 07/07/14   Chrystie Nose, MD    Family History Family History  Problem Relation Age of Onset  . Multiple sclerosis Mother   . Dementia Father   . Stroke Sister     Social History Social History  Substance Use Topics  . Smoking status: Current Every Day Smoker  . Smokeless tobacco: Never Used  . Alcohol use 0.0 oz/week     Comment: drinks 3-4 oz of whiskey 6x a week and 3 12oz beers a week     Allergies   Aspirin   Review of Systems Review of Systems  Constitutional: Negative for chills and fever.  HENT: Positive for dental problem and mouth sores. Negative for congestion, sore throat and trouble swallowing.   Eyes: Negative for pain and visual disturbance.  Respiratory: Negative for cough and shortness of breath.   Cardiovascular: Negative for chest pain and palpitations.  Gastrointestinal: Negative for abdominal pain, diarrhea, nausea and vomiting.  Genitourinary: Negative for dysuria.  Musculoskeletal: Negative for back pain and neck pain.  Skin: Negative for rash.  Neurological: Positive for syncope and light-headedness. Negative for dizziness, speech difficulty, weakness, numbness and headaches.     Physical Exam Updated Vital Signs BP 143/98 (BP Location: Left Arm)   Pulse 78   Temp 97.5 F (36.4 C) (Oral)    Resp 15   Ht 5\' 9"  (1.753 m)   Wt 63.5 kg   SpO2 99%   BMI 20.67 kg/m   Physical Exam  Constitutional: He is oriented to person, place, and time. He appears well-developed and well-nourished. No distress.  Nontoxic appearing.  HENT:  Head: Normocephalic and atraumatic.  Right Ear: External ear normal.  Left Ear: External ear normal.  Mouth/Throat: Oropharynx is clear and moist.  Patient has a large quarter sized lesion noted to his right upper roof of his mouth that is hard and slightly tender to palpation. It is the same color as the rest of his mouth. No fluctuance. No discharge. No other lesions noted. Throat is clear.  Bilateral tympanic membranes are pearly-gray without erythema or loss of landmarks.    Eyes: Conjunctivae and EOM are normal. Pupils are equal, round, and reactive to light. Right eye exhibits no discharge. Left eye exhibits no discharge.  Neck: Normal range of motion. Neck supple. No JVD present. No tracheal deviation present.  Cardiovascular: Normal rate, regular rhythm, normal heart sounds and intact distal pulses.  Exam reveals no gallop and no friction rub.   No murmur heard. Bilateral radial pulses are intact.  Pulmonary/Chest: Effort normal and breath sounds normal. No stridor. No respiratory distress. He has no wheezes. He has no rales.  Lungs are clear to auscultation bilaterally.  Abdominal: Soft. He exhibits no distension. There is no tenderness. There is no guarding.  Abdomen is soft and nontender to palpation.  Musculoskeletal: Normal range of motion. He exhibits no edema or tenderness.  No lower extremity edema or tenderness. Patient is spontaneously moving all extremities in a coordinated fashion exhibiting good strength.   Lymphadenopathy:    He has no cervical adenopathy.  Neurological: He is alert and oriented to person, place, and time. No cranial nerve deficit or sensory deficit. Coordination normal.  Patient is alert and oriented 3. Cranial  nerves are intact. Speech is clear and coherent. Finger to nose intact. EOMs are intact. Vision is grossly intact. Sensation is intact in his bilateral upper and lower extremities.  Skin: Skin is warm and dry. Capillary refill takes less than 2 seconds. No rash noted. He is not diaphoretic. No erythema. No pallor.  Psychiatric: He has a normal mood and affect.  Irritable.   Nursing note and vitals reviewed.    ED Treatments / Results  Labs (all labs ordered are listed, but only abnormal results are displayed) Labs Reviewed  COMPREHENSIVE METABOLIC PANEL - Abnormal; Notable for the following:       Result Value   Potassium 5.5 (*)    Glucose, Bld 112 (*)    Creatinine, Ser 1.47 (*)    Total Protein 8.5 (*)    ALT 13 (*)    GFR calc non Af Amer 50 (*)    GFR calc Af Amer 58 (*)    All other components within normal limits  CBC WITH DIFFERENTIAL/PLATELET - Abnormal;  Notable for the following:    WBC 15.2 (*)    RDW 16.2 (*)    Neutro Abs 13.0 (*)    All other components within normal limits  URINALYSIS, ROUTINE W REFLEX MICROSCOPIC - Abnormal; Notable for the following:    Ketones, ur 20 (*)    Protein, ur 30 (*)    All other components within normal limits  RAPID URINE DRUG SCREEN, HOSP PERFORMED - Abnormal; Notable for the following:    Cocaine POSITIVE (*)    All other components within normal limits  ACETAMINOPHEN LEVEL - Abnormal; Notable for the following:    Acetaminophen (Tylenol), Serum <10 (*)    All other components within normal limits  CBC - Abnormal; Notable for the following:    WBC 15.6 (*)    RDW 15.6 (*)    All other components within normal limits  CREATININE, SERUM - Abnormal; Notable for the following:    Creatinine, Ser 1.44 (*)    GFR calc non Af Amer 51 (*)    GFR calc Af Amer 59 (*)    All other components within normal limits  I-STAT CG4 LACTIC ACID, ED - Abnormal; Notable for the following:    Lactic Acid, Venous 3.26 (*)    All other components  within normal limits  ETHANOL  SALICYLATE LEVEL  POTASSIUM  MAGNESIUM  AMMONIA  CBC WITH DIFFERENTIAL/PLATELET  COMPREHENSIVE METABOLIC PANEL  LACTIC ACID, PLASMA  I-STAT TROPOININ, ED    EKG  EKG Interpretation  Date/Time:  Tuesday March 25 2016 15:03:16 EST Ventricular Rate:  59 PR Interval:    QRS Duration: 76 QT Interval:  449 QTC Calculation: 445 R Axis:   -34 Text Interpretation:  Sinus rhythm Inferior infarct, old Probable anteroseptal infarct, old Artifact limits interpretation Possible Uwaves versus artifact Otherwise no significant change Confirmed by Geisinger Community Medical CenterCHLOSSMAN MD, ERIN ([id number]) on 03/25/2016 3:14:23 PM       Radiology Dg Chest 2 View  Result Date: 03/25/2016 CLINICAL DATA:  Syncope.  Ibuprofen overdose. EXAM: CHEST  2 VIEW COMPARISON:  11/06/2010. FINDINGS: Normal sized heart. Tortuous aorta. Clear lungs with normal vascularity. Right ventriculoperitoneal shunt tube. Unremarkable bones. IMPRESSION: No acute abnormality. Electronically Signed   By: Beckie SaltsSteven  Reid M.D.   On: 03/25/2016 16:28   Ct Head Wo Contrast  Result Date: 03/25/2016 CLINICAL DATA:  60 year old male with a history of altered mental status EXAM: CT HEAD WITHOUT CONTRAST TECHNIQUE: Contiguous axial images were obtained from the base of the skull through the vertex without intravenous contrast. COMPARISON:  07/31/2014, 11/06/2010 FINDINGS: Brain: No acute intracranial hemorrhage. No midline shift or mass effect. Unremarkable appearance of the ventricular system. 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 Skull: Surgical changes of right pterional craniotomy for surgical clipping of aneurysm. No acute bony abnormality. Right posterior parietal approach ventriculostomy terminates to the left of midline. Sinuses/Orbits: No significant sinus disease. Other: None IMPRESSION: No CT evidence of acute intracranial abnormality. 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 Signed, Yvone NeuJaime S. Loreta AveWagner, DO [facility] Electronically Signed   By: Gilmer MorJaime  Wagner D.O.   On: 03/25/2016 20:30    Procedures Procedures (including critical care time)  Medications Ordered in ED Medications  sodium chloride flush (NS) 0.9 % injection 3 mL (3 mLs Intravenous Not Given 03/25/16 2220)  sodium chloride flush (NS) 0.9 %  injection 3 mL (3 mLs Intravenous Not Given 03/25/16 2221)  enoxaparin (LOVENOX) injection 40 mg (not administered)  ondansetron (ZOFRAN) tablet 4 mg (not administered)    Or  ondansetron (ZOFRAN) injection 4 mg (not administered)  LORazepam (ATIVAN) 2 MG/ML injection (1 mg  Not Given 03/26/16 0046)  magnesium sulfate IVPB 2 g 50 mL (not administered)  LORazepam (ATIVAN) injection 1 mg (1 mg Intravenous Given 03/25/16 1659)  sodium chloride 0.9 % bolus 1,000 mL (0 mLs Intravenous Stopped 03/25/16 2123)  labetalol (NORMODYNE,TRANDATE) injection 10 mg (10 mg Intravenous Given 03/25/16 1936)  LORazepam (ATIVAN) injection 1 mg (1 mg Intravenous Given 03/25/16 1826)  haloperidol lactate (HALDOL) injection 5 mg (5 mg Intravenous Given 03/25/16 1925)  diphenhydrAMINE (BENADRYL) injection 25 mg (25 mg Intravenous Given 03/25/16 1936)  labetalol (NORMODYNE,TRANDATE) injection 10 mg (10 mg Intravenous Given 03/25/16 2125)  LORazepam (ATIVAN) injection 1 mg (1 mg Intravenous Given 03/25/16 2124)  sodium chloride 0.9 % bolus 1,000 mL (0 mLs Intravenous Stopped 03/26/16 0048)  haloperidol lactate (HALDOL) 5 MG/ML injection (5 mg  Given 03/26/16 0054)     Initial Impression / Assessment and Plan / ED Course  I have reviewed the triage vital signs and the nursing notes.  Pertinent labs & imaging results that were available during my care of the patient were  reviewed by me and considered in my medical decision making (see chart for details).  Clinical Course    This  is a 60 y.o. Male who presents to the ED via EMS from urgent care complaining of mouth pain for the past three days. Patient reports he was eating at all so hard shell when he feels like a piece of it hit the roof of his mouth causing lots of pain. He then reports last night he took 12-16 ibuprofen between 5pm-5am due to pain. He went to urgent care to get his mouth looked at and while there he passed out according to records. The patient reports he does not remember passing out. [facility] briefly lost consciousness after standing up. According to note patient stood up and became lightheaded and passed out.  Patient's only current complaint is mouth pain. He denies headache. He denies abdominal pain, nausea or vomiting. No hematemesis or hematochezia. Patient does have a history of hypertension and reports he has not taken his blood pressure medicine since 2013. He also tells me he is a smoker. He also drinks alcohol regularly. He denies SI. Later, he tells me he tries to drink alcohol daily, but does not.   On initial exam patient is afebrile nontoxic appearing. He is alert and oriented. He is irritable and seems frustrated. He is one point refuses to have any blood drawn. Today going to explain to him again why this was important. The lesion is on the roof of his mouth and is flesh-colored. It appears to be chronic and not new. It is not fluctuant. It is hard. It seems to be part of his soft palate. No other mouth lesions noted.  CMP returned with a potassium of 5.5. No evidence of hyperkalemia on EKG. Will repeat potassium. Liver enzymes are unremarkable. Creatinine is elevated 1.47. Normal anion gap. CBC is remarkable for a white count of 15,000. No fevers. Urinalysis without evidence of infection. Troponin is not elevated. Chest x-ray is unremarkable. Urine drug  screen is positive for cocaine. Salicylate and acetaminophen levels are unremarkable. Alcohol level is undetectable.  Patient still hypertensive and there is  some concern for alcohol withdrawal. Patient provided with a milligram of Ativan and patient became more irritable and agitated.  Girlfriend tells me she thinks he last used cocaine three days ago, but she is not sure. She left soon after I spoke with her briefly. She does agree that he is not acting himself.   A few minutes later I found him standing up in the room where he had pulled out his IV and urinated on the floor. He tells me he doesn't feel like we are doing anything and does not seem to be acting himself. No tremors. No tachycardia. He is swinging at the nurses and makes statements that do not make since. Like he wants a knife to cut the vines. Patient still needs CT scan of his head. Will give haldol so we can get head CT.  CT head without evidence of acute intracranial abnormality. Patient still altered. He is possibly having visual hallucinations. Question if this is from his hypertension related to cocaine use or from alcohol withdrawal. Will provide with additional ativan. Will admit to medicine.   I consulted with Dr. Robb Matar who accepted the patient for admission for step down.   After admission lactic acid return elevated at 3.26. I contacted Dr. Robb Matar to let him know and he is ordering more fluids. I doubt this is related to infection and more likely from the patient being combative and fighting staff. Additional fluids ordered. Repeat potassium is normal.   This patient was discussed with and evaluated by Dr. Dalene Seltzer who agrees with assessment and plan.    CRITICAL CARE Performed by: Lawana Chambers   Total critical care time: 55 minutes  Critical care time was exclusive of separately billable procedures and treating other patients.  Critical care was necessary to treat or prevent imminent or life-threatening  deterioration.  Critical care was time spent personally by me on the following activities: development of treatment plan with patient and/or surrogate as well as nursing, discussions with consultants, evaluation of patient's response to treatment, examination of patient, obtaining history from patient or surrogate, ordering and performing treatments and interventions, ordering and review of laboratory studies, ordering and review of radiographic studies, pulse oximetry and re-evaluation of patient's condition.  Final Clinical Impressions(s) / ED Diagnoses   Final diagnoses:  Altered mental status, unspecified altered mental status type  Cocaine abuse  Lesion of mouth  Syncope and collapse    New Prescriptions New Prescriptions   No medications on file     Everlene Farrier, PA-C 03/26/16 0123    Alvira Monday, MD 03/27/16 [phone]

## 2016-03-25 NOTE — ED Notes (Signed)
Called carelink °

## 2016-03-25 NOTE — ED Notes (Addendum)
Notified poison control. Since the patient is not have n/v/d or abdominal pain, poison control states other than poss kidney injury, patient is not a concern.

## 2016-03-25 NOTE — ED Notes (Signed)
02 at 2 lpm via N/C

## 2016-03-25 NOTE — ED Notes (Signed)
Patient report called to KulmJessica.

## 2016-03-25 NOTE — ED Triage Notes (Signed)
[facility] a [facility] mouth pain x 3 days. The patient reported that he cut the roof of his mouth with a taco shell. During the assessment the patient became nauseous and vomited. The patient stated that he had taken 16 PO Ibuprofen between 2 am and 5 am today in an attempt to reduce the pain. The patient was being assisted to the lobby for diversion to the ED when he became light headed and had a syncope episode. The patient was placed supine on the table. Physician at bedside advised IV, 02 and cardiac bedside monitor. [facility]  [facility]

## 2016-03-25 NOTE — ED Notes (Signed)
Pt had episode of incontinence. Sheets changed, pt cleaned. Condom cath applied.

## 2016-03-25 NOTE — ED Notes (Signed)
Pt transported to CT with this RN 

## 2016-03-25 NOTE — ED Notes (Signed)
EDP aware of pt BP still being elevated, verbal order for another 10mg  Labetalol

## 2016-03-25 NOTE — ED Notes (Signed)
Family at bedside. 

## 2016-03-25 NOTE — ED Notes (Signed)
2 RN's attempted to start IV.  

## 2016-03-25 NOTE — ED Triage Notes (Addendum)
Patient comes in from Urgent Care after biting down on hard taco shell and hurting the roof of his mouth. Patient then took 16 ibuprofen tablets for the pain from 5pm-5am. Patient was transferring to here when he stood up and had a syncopal episode. EMS vital signs 194/130, 62, 99% RA. Hx of HTN. Allergy to ASA. EMS got 18 LH. 1 liter IV fluid started.

## 2016-03-26 ENCOUNTER — Inpatient Hospital Stay (HOSPITAL_COMMUNITY): Payer: Medicare Other

## 2016-03-26 ENCOUNTER — Encounter (HOSPITAL_COMMUNITY): Payer: Medicare Other

## 2016-03-26 ENCOUNTER — Encounter (HOSPITAL_COMMUNITY): Payer: Self-pay | Admitting: Internal Medicine

## 2016-03-26 DIAGNOSIS — R55 Syncope and collapse: Secondary | ICD-10-CM

## 2016-03-26 DIAGNOSIS — F1490 Cocaine use, unspecified, uncomplicated: Secondary | ICD-10-CM | POA: Diagnosis present

## 2016-03-26 DIAGNOSIS — K1379 Other lesions of oral mucosa: Secondary | ICD-10-CM | POA: Diagnosis present

## 2016-03-26 DIAGNOSIS — F10239 Alcohol dependence with withdrawal, unspecified: Secondary | ICD-10-CM

## 2016-03-26 DIAGNOSIS — E872 Acidosis, unspecified: Secondary | ICD-10-CM | POA: Diagnosis present

## 2016-03-26 DIAGNOSIS — E875 Hyperkalemia: Secondary | ICD-10-CM | POA: Diagnosis present

## 2016-03-26 DIAGNOSIS — F10939 Alcohol use, unspecified with withdrawal, unspecified: Secondary | ICD-10-CM | POA: Diagnosis present

## 2016-03-26 LAB — VAS US CAROTID
LCCADDIAS: -14 cm/s
LCCAPDIAS: 24 cm/s
LCCAPSYS: 119 cm/s
LEFT ECA DIAS: -23 cm/s
LEFT VERTEBRAL DIAS: 14 cm/s
LICADDIAS: 21 cm/s
LICADSYS: 109 cm/s
LICAPSYS: -95 cm/s
Left CCA dist sys: -81 cm/s
Left ICA prox dias: -27 cm/s
RIGHT ECA DIAS: -17 cm/s
RIGHT VERTEBRAL DIAS: -16 cm/s
Right CCA prox dias: 16 cm/s
Right CCA prox sys: 92 cm/s
Right cca dist sys: -73 cm/s

## 2016-03-26 LAB — CBC WITH DIFFERENTIAL/PLATELET
Basophils Absolute: 0 10*3/uL (ref 0.0–0.1)
Basophils Relative: 0 %
Eosinophils Absolute: 0 10*3/uL (ref 0.0–0.7)
Eosinophils Relative: 0 %
HEMATOCRIT: 47 % (ref 39.0–52.0)
HEMOGLOBIN: 15.7 g/dL (ref 13.0–17.0)
LYMPHS ABS: 2.3 10*3/uL (ref 0.7–4.0)
Lymphocytes Relative: 15 %
MCH: 29.7 pg (ref 26.0–34.0)
MCHC: 33.4 g/dL (ref 30.0–36.0)
MCV: 88.8 fL (ref 78.0–100.0)
MONO ABS: 1.2 10*3/uL — AB (ref 0.1–1.0)
MONOS PCT: 8 %
NEUTROS ABS: 11.9 10*3/uL — AB (ref 1.7–7.7)
Neutrophils Relative %: 77 %
Platelets: 326 10*3/uL (ref 150–400)
RBC: 5.29 MIL/uL (ref 4.22–5.81)
RDW: 15.8 % — AB (ref 11.5–15.5)
WBC: 15.5 10*3/uL — ABNORMAL HIGH (ref 4.0–10.5)

## 2016-03-26 LAB — LACTIC ACID, PLASMA
LACTIC ACID, VENOUS: 2.3 mmol/L — AB (ref 0.5–1.9)
Lactic Acid, Venous: 3.6 mmol/L (ref 0.5–1.9)
Lactic Acid, Venous: 3.6 mmol/L (ref 0.5–1.9)
Lactic Acid, Venous: 1.9 mmol/L (ref 0.5–1.9)

## 2016-03-26 LAB — CBC
HCT: 48.8 % (ref 39.0–52.0)
HEMOGLOBIN: 16.7 g/dL (ref 13.0–17.0)
MCH: 30.3 pg (ref 26.0–34.0)
MCHC: 34.2 g/dL (ref 30.0–36.0)
MCV: 88.6 fL (ref 78.0–100.0)
Platelets: 293 10*3/uL (ref 150–400)
RBC: 5.51 MIL/uL (ref 4.22–5.81)
RDW: 15.6 % — ABNORMAL HIGH (ref 11.5–15.5)
WBC: 15.6 10*3/uL — AB (ref 4.0–10.5)

## 2016-03-26 LAB — CREATININE, SERUM
CREATININE: 1.44 mg/dL — AB (ref 0.61–1.24)
GFR, EST AFRICAN AMERICAN: 59 mL/min — AB (ref >60)
GFR, EST NON AFRICAN AMERICAN: 51 mL/min — AB (ref >60)

## 2016-03-26 LAB — ECHOCARDIOGRAM COMPLETE
Height: 69 in
Weight: 1992 oz

## 2016-03-26 LAB — COMPREHENSIVE METABOLIC PANEL
ALK PHOS: 95 U/L (ref 38–126)
ALT: 14 U/L — ABNORMAL LOW (ref 17–63)
ANION GAP: 8 (ref 5–15)
AST: 28 U/L (ref 15–41)
Albumin: 3.8 g/dL (ref 3.5–5.0)
BILIRUBIN TOTAL: 1.2 mg/dL (ref 0.3–1.2)
BUN: 10 mg/dL (ref 6–20)
CALCIUM: 9 mg/dL (ref 8.9–10.3)
CO2: 25 mmol/L (ref 22–32)
Chloride: 107 mmol/L (ref 101–111)
Creatinine, Ser: 1.28 mg/dL — ABNORMAL HIGH (ref 0.61–1.24)
GFR, EST NON AFRICAN AMERICAN: 59 mL/min — AB (ref >60)
GLUCOSE: 98 mg/dL (ref 65–99)
POTASSIUM: 3.9 mmol/L (ref 3.5–5.1)
Sodium: 140 mmol/L (ref 135–145)
TOTAL PROTEIN: 7.8 g/dL (ref 6.5–8.1)

## 2016-03-26 LAB — AMMONIA: AMMONIA: 31 umol/L (ref 9–35)

## 2016-03-26 LAB — PROCALCITONIN: Procalcitonin: <0.1 ng/mL

## 2016-03-26 LAB — MRSA PCR SCREENING: MRSA BY PCR: NEGATIVE

## 2016-03-26 LAB — TROPONIN I: Troponin I: <0.03 ng/mL (ref <0.03)

## 2016-03-26 MED ORDER — MAGNESIUM SULFATE 2 GM/50ML IV SOLN
2.0000 g | Freq: Once | INTRAVENOUS | Status: AC
  Administered 2016-03-26: 2 g via INTRAVENOUS
  Filled 2016-03-26: qty 50

## 2016-03-26 MED ORDER — LORAZEPAM 2 MG/ML IJ SOLN
2.0000 mg | INTRAMUSCULAR | Status: DC | PRN
  Administered 2016-03-26 (×2): 3 mg via INTRAVENOUS
  Administered 2016-03-26: 2 mg via INTRAVENOUS
  Administered 2016-03-26 (×2): 3 mg via INTRAVENOUS
  Administered 2016-03-27: 2 mg via INTRAVENOUS
  Administered 2016-03-27: 3 mg via INTRAVENOUS
  Administered 2016-03-27 (×3): 2 mg via INTRAVENOUS
  Administered 2016-03-28: 3 mg via INTRAVENOUS
  Administered 2016-03-28: 2 mg via INTRAVENOUS
  Administered 2016-03-28 (×6): 3 mg via INTRAVENOUS
  Administered 2016-03-29 – 2016-03-31 (×5): 2 mg via INTRAVENOUS
  Filled 2016-03-26 (×3): qty 2
  Filled 2016-03-26 (×3): qty 1
  Filled 2016-03-26 (×3): qty 2
  Filled 2016-03-26 (×2): qty 1
  Filled 2016-03-26: qty 2
  Filled 2016-03-26: qty 1
  Filled 2016-03-26: qty 2
  Filled 2016-03-26: qty 1
  Filled 2016-03-26: qty 2
  Filled 2016-03-26 (×3): qty 1
  Filled 2016-03-26 (×3): qty 2
  Filled 2016-03-26: qty 1
  Filled 2016-03-26: qty 2

## 2016-03-26 MED ORDER — LISINOPRIL 20 MG PO TABS
20.0000 mg | ORAL_TABLET | Freq: Every day | ORAL | Status: DC
  Administered 2016-03-27 – 2016-04-03 (×7): 20 mg via ORAL
  Filled 2016-03-26 (×9): qty 1

## 2016-03-26 MED ORDER — HALOPERIDOL LACTATE 5 MG/ML IJ SOLN
INTRAMUSCULAR | Status: AC
  Administered 2016-03-26: 5 mg
  Filled 2016-03-26: qty 1

## 2016-03-26 MED ORDER — SODIUM CHLORIDE 0.9 % IV SOLN
INTRAVENOUS | Status: DC
  Administered 2016-03-26 – 2016-03-27 (×4): via INTRAVENOUS
  Administered 2016-03-28: 100 mL via INTRAVENOUS
  Administered 2016-03-29 – 2016-03-30 (×3): via INTRAVENOUS

## 2016-03-26 MED ORDER — HYDRALAZINE HCL 20 MG/ML IJ SOLN
10.0000 mg | Freq: Four times a day (QID) | INTRAMUSCULAR | Status: DC | PRN
  Administered 2016-03-26 – 2016-03-28 (×6): 10 mg via INTRAVENOUS
  Filled 2016-03-26 (×6): qty 1

## 2016-03-26 MED ORDER — SODIUM CHLORIDE 0.9 % IV BOLUS (SEPSIS)
1000.0000 mL | Freq: Once | INTRAVENOUS | Status: AC
  Administered 2016-03-26: 1000 mL via INTRAVENOUS

## 2016-03-26 MED ORDER — ENSURE ENLIVE PO LIQD
237.0000 mL | Freq: Two times a day (BID) | ORAL | Status: DC
  Administered 2016-03-27 – 2016-04-09 (×15): 237 mL via ORAL

## 2016-03-26 NOTE — ED Notes (Addendum)
Pt was given 1mg  ativan, after admin noted that IV was infiltrated. IV was removed

## 2016-03-26 NOTE — Plan of Care (Signed)
Problem: Safety: Goal: Ability to remain free from injury will improve Outcome: Progressing Patient unable to respond to anything but pain at this time; bed alarm on for safety with no teach back displayed.

## 2016-03-26 NOTE — Progress Notes (Signed)
VASCULAR LAB PRELIMINARY  PRELIMINARY  PRELIMINARY  PRELIMINARY  Carotid duplex completed.    Preliminary report:  Right - No obvious evidence of extracranial carodtid stenosis. Left - 1% to 39% ICA stenosis.  Nira Visscher, RVS 03/26/2016, 3:14 PM

## 2016-03-26 NOTE — Plan of Care (Signed)
Problem: Safety: Goal: Ability to remain free from injury will improve Outcome: Not Progressing Patient remains under CIWA protocol and not using rational judgement regarding his safety

## 2016-03-26 NOTE — Progress Notes (Signed)
Initial Nutrition Assessment  DOCUMENTATION CODES:   Underweight  INTERVENTION:    Ensure Enlive po BID, each supplement provides 350 kcal and 20 grams of protein  NUTRITION DIAGNOSIS:   Increased nutrient needs related to chronic illness as evidenced by estimated needs  GOAL:   Patient will meet greater than or equal to 90% of their needs  MONITOR:   PO intake, Supplement acceptance, Labs, Weight trends, I & O's  REASON FOR ASSESSMENT:   Consult Assessment of nutrition requirement/status  ASSESSMENT:   60 y.o. Male with known hx of hypertension, stroke, alcohol abuse who was brought to the emergency department after having a syncopal episode at the urgent care center earlier in the day prior to this admission. [facility] sided hard palate lesion. Pt reported taking large amount of Ibuprofen. Girlfriend who [facility], reported pt used cocaine 3 days piror to this admission.   Pt currently not in room >> in VASCULAR LAB. Dx with acute metabolic encephalopathy, ? sepsis. Nutrient needs increased >> will order nutrition supplements. Labs and medications reviewed. On CIWA Protocol.  RD unable to complete Nutrition Focused Physical Exam at this time.  Diet Order:  Diet Heart Room service appropriate? Yes; Fluid consistency: Thin  Skin:  Reviewed, no issues  Last BM:  12/20  Height:   Ht Readings from Last 1 Encounters:  03/26/16 5\' 9"  (1.753 m)    Weight:   Wt Readings from Last 1 Encounters:  03/26/16 124 lb 8 oz (56.5 kg)    Ideal Body Weight:  73 kg  BMI:  Body mass index is 18.39 kg/m.  Estimated Nutritional Needs:   Kcal:  1700-1900  Protein:  80-90 gm  Fluid:  1.7-1.9 L  EDUCATION NEEDS:   No education needs identified at this time  Maureen ChattersKatie Jobie Popp, RD, LDN Pager #: [phone] After-Hours Pager #: [phone]

## 2016-03-26 NOTE — Progress Notes (Signed)
Patient had a critical lactic acid 3.6 (previous 3.6) on call md paged

## 2016-03-26 NOTE — Progress Notes (Signed)
CRITICAL VALUE ALERT  Critical value received:  Lactic Acic 2.3  Date of notification:  03/26/2016  Time of notification:  1443  Critical value read back:Yes.    Nurse who received alert:  Beckey Downingedelda Chyler Creely  MD notified (1st page):  Izola PriceMyers, I  Time of first page:  1445  MD notified (2nd page):  Time of second page:  Responding MD:  Denna HaggardMyers, I  Time MD responded:  [phone]

## 2016-03-26 NOTE — ED Notes (Signed)
Repeat E[facility] obtained and [facility]

## 2016-03-26 NOTE — Progress Notes (Signed)
  Echocardiogram 2D Echocardiogram has been performed.  Leonard Forbes 03/26/2016, 9:50 AM

## 2016-03-26 NOTE — H&P (Signed)
History and Physical    Leonard Forbes ZOX:[id number] DOB: 1955-11-10 DOA: 03/25/2016  PCP: No PCP Per Patient   Patient coming from: Urgent care center.  Chief Complaint: Syncope.  HPI: Leonard Forbes is a 60 y.o. male with medical history significant of headaches, Richard L Tyrrell aneurysm, hypertension, stroke, alcohol abuse who was brought to the emergency department after having a syncopal episode at the [facility] care area.  Initially the patient went to the urgent care center to get evaluated a right-sided hard palate lesion. He is stated that he took a large amount of ibuprofen yesterday in 12 hours due to pain. While in the [facility] area, he had a syncopal episode. He did not remember what happened, did not provide much history earlier and his girlfriend added that he used cocaine 3 days ago.   ED Course: The patient was given Ativan due to suspicion of alcohol withdrawal. However, this seemed to make the patient restless and he was given Haldol. He is currently sedated. He was also given 2 doses of labetalol 10 mg IVP.  His workup showed WBC of 15.2, hemoglobin is 16.3, platelets 355. Alcohol was less than 5, acetaminophen less than 10, salicylate less than 7, BUN 12, creatinine 1.47 and glucose 112 mg/dL. First troponin was negative. His UDS was positive for cocaine.  Imaging: Two-view chest radiograph and CT scan of the brain did not show any acute abnormalities.  Review of Systems: As per HPI otherwise 10 point review of systems negative.    Past Medical History:  Diagnosis Date  . Headache   . Headache disorder 06/22/2014  . 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 Hypertension   . Stroke [facility])     Past Surgical History:  Procedure Laterality Date  . CAROTID ENDARTERECTOMY    . CRANIOTOMY     with clipping  . orif mandible Left   . VENTRICULOPERITONEAL SHUNT       reports that he has been smoking.  He has never used smokeless tobacco. He reports that he  drinks alcohol. He reports that he does not use drugs.  Allergies  Allergen Reactions  . Aspirin Other (See Comments)    Stomach ulcer    Family History  Problem Relation Age of Onset  . Multiple sclerosis Mother   . Dementia Father   . Stroke Sister     Prior to Admission medications   Medication Sig Start Date End Date Taking? Authorizing Provider  lisinopril (PRINIVIL,ZESTRIL) 20 MG tablet Take 1 tablet (20 mg total) by mouth daily. Patient not taking: Reported on 03/25/2016 07/07/14   Chrystie Nose, MD    Physical Exam:  Constitutional: NAD, calm, comfortable Vitals:   03/25/16 2207 03/25/16 2230 03/25/16 2246 03/25/16 2312  BP: 156/87 (!) 171/109 (!) 168/107 (!) 185/116  Pulse: 69 71 78 71  Resp: 25 17 14 16   Temp:      TempSrc:      SpO2: 97% 100% 99% 99%  Weight:      Height:       Eyes: PERRL, lids and conjunctivae normal ENMT: Mucous membranes are moist. Right-sided 2.5 x 1.5 cm right-sided hard palate protruding lesion. Posterior pharynx clear of any exudate or lesions. Neck: normal, supple, no masses, no thyromegaly Respiratory: clear to auscultation bilaterally, no wheezing, no crackles. Normal respiratory effort. No accessory muscle use.  Cardiovascular: Regular rate and rhythm, no murmurs / rubs / gallops. No extremity edema. 2+ pedal pulses. No carotid bruits.  Abdomen:  no tenderness, no masses palpated. No hepatosplenomegaly. Bowel sounds positive.  Musculoskeletal: no clubbing / cyanosis. Good ROM, no contractures. Relaxed muscle tone.  Skin: Skin looks dry with area of hyperkeratosis on lower extremities. Neurologic: Currently sedated and unable to provide history. Psychiatric: Currently sedated.   Labs on Admission: I have personally reviewed following labs and imaging studies  CBC:  Recent Labs Lab 03/25/16 1639  WBC 15.2*  NEUTROABS 13.0*  HGB 16.3  HCT 48.6  MCV 89.5  PLT 355   Basic Metabolic Panel:  Recent Labs Lab 03/25/16 1639  03/25/16 2059 03/25/16 2104  NA 143  --   --   K 5.5* 4.7  --   CL 108  --   --   CO2 23  --   --   GLUCOSE 112*  --   --   BUN 12  --   --   CREATININE 1.47*  --   --   CALCIUM 9.9  --   --   MG  --   --  1.9   GFR: Estimated Creatinine Clearance: 47.4 mL/min (by C-G formula based on SCr of 1.47 mg/dL (H)). Liver Function Tests:  Recent Labs Lab 03/25/16 1639  AST 19  ALT 13*  ALKPHOS 95  BILITOT 1.0  PROT 8.5*  ALBUMIN 4.3   No results for input(s): LIPASE, AMYLASE in the last 168 hours. No results for input(s): AMMONIA in the last 168 hours. Coagulation Profile: No results for input(s): INR, PROTIME in the last 168 hours. Cardiac Enzymes: No results for input(s): CKTOTAL, CKMB, CKMBINDEX, TROPONINI in the last 168 hours. BNP (last 3 results) No results for input(s): PROBNP in the last 8760 hours. HbA1C: No results for input(s): HGBA1C in the last 72 hours. CBG: No results for input(s): GLUCAP in the last 168 hours. Lipid Profile: No results for input(s): CHOL, HDL, LDLCALC, TRIG, CHOLHDL, LDLDIRECT in the last 72 hours. Thyroid Function Tests: No results for input(s): TSH, T4TOTAL, FREET4, T3FREE, THYROIDAB in the last 72 hours. Anemia Panel: No results for input(s): VITAMINB12, FOLATE, FERRITIN, TIBC, IRON, RETICCTPCT in the last 72 hours. Urine analysis:    Component Value Date/Time   COLORURINE YELLOW 03/25/2016 1555   APPEARANCEUR CLEAR 03/25/2016 1555   LABSPEC 1.014 03/25/2016 1555   PHURINE 5.0 03/25/2016 1555   GLUCOSEU NEGATIVE 03/25/2016 1555   HGBUR NEGATIVE 03/25/2016 1555   BILIRUBINUR NEGATIVE 03/25/2016 1555   KETONESUR 20 (A) 03/25/2016 1555   PROTEINUR 30 (A) 03/25/2016 1555   UROBILINOGEN 0.2 11/05/2010 2248   NITRITE NEGATIVE 03/25/2016 1555   LEUKOCYTESUR NEGATIVE 03/25/2016 1555    Radiological Exams on Admission: Dg Chest 2 View  Result Date: 03/25/2016 CLINICAL DATA:  Syncope.  Ibuprofen overdose. EXAM: CHEST  2 VIEW  COMPARISON:  11/06/2010. FINDINGS: Normal sized heart. Tortuous aorta. Clear lungs with normal vascularity. Right ventriculoperitoneal shunt tube. Unremarkable bones. IMPRESSION: No acute abnormality. Electronically Signed   By: Beckie SaltsSteven  Reid M.D.   On: 03/25/2016 16:28   Ct Head Wo Contrast  Result Date: 03/25/2016 CLINICAL DATA:  60 year old male with a history of altered mental status EXAM: CT HEAD WITHOUT CONTRAST TECHNIQUE: Contiguous axial images were obtained from the base of the skull through the vertex without intravenous contrast. COMPARISON:  07/31/2014, 11/06/2010 FINDINGS: Brain: No acute intracranial hemorrhage. No midline shift or mass effect. Unremarkable appearance of the ventricular system. 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 Skull: Surgical changes of right pterional craniotomy for surgical clipping of  aneurysm. No acute bony abnormality. Right posterior parietal approach ventriculostomy terminates to the left of midline. Sinuses/Orbits: No significant sinus disease. Other: None IMPRESSION: No CT evidence of acute intracranial abnormality. 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 Signed, Yvone NeuJaime S. Loreta AveWagner, DO [facility] Electronically Signed   By: Gilmer MorJaime  Wagner D.O.   On: 03/25/2016 20:30    EKG: Independently reviewed. Vent. rate 59 BPM PR interval * ms QRS duration 76 ms QT/QTc 449/445 ms P-R-T axes 75 -34 -26 Sinus rhythm Inferior infarct, old Probable anteroseptal infarct, old Artifact limits interpretation Possible Uwaves versus artifact No significant changes from previous tracing.  Assessment/Plan Principal Problem:   Altered mental status Secondary to withdrawal or medications or both? Admit to  stepdown/inpatient. Continue alcohol withdrawal protocol. Continue IV hydration. Ammonia level was not drawn yesterday evening, will recheck with next draw.  Active Problems:   Syncope Continue cardiac telemetry. Trend troponin level. Check echocardiogram in a.m. Check carotid Doppler in a.m.    HTN (hypertension) Continue lisinopril. We will start when necessary hydralazine IVP    Alcohol withdrawal (HCC) Start CiWA protocol.  Thiamine, folic acid, MVI and magnesium supplementation.    Leukocytosis No fever or obvious source of infection. Continue IV hydration. Follow 5 AM labs WBC results..    Cocaine use Per records, this was 3 days ago according to his girlfriend.    Lactic acidosis Continue IV hydration.    Hyperkalemia Resolved with IV fluids.    Lesion of hard palate Would recommend oral surgeon evaluation as an outpatient.   DVT prophylaxis: Lovenox SQ. Code Status: Full. Family Communication:  Disposition Plan: For syncopal workup. Consults called:  Admission status: Inpatient/stepdown.   Bobette Moavid Manuel Ortiz MD [facility] Pager [phone].  If 7PM-7AM, please contact night-coverage [email or web address] Password [email or web address]  03/26/2016, 12:28 AM

## 2016-03-26 NOTE — Progress Notes (Signed)
Patient ID: Leonard Forbes, male   DOB: January 12, 1956, 60 y.o.   MRN: [id number]    PROGRESS NOTE    Leonard Forbes  Leonard Forbes DOB: [birth year] DOA: 03/25/2016  PCP: unknown who is PCP and pt unable to verbalize at this time due to altered mental status   Brief Narrative:  60 y.o. male with known hx of hypertension, stroke, alcohol abuse who was brought to the emergency department after having a syncopal episode at the urgent care center earlier in the day prior to this admission. Pt was apparently at the [facility] for evaluation of right sided hard palate lesion. Pt reported taking large amount of Ibuprofen. Girlfriend who was present with pt at the [facility], reported pt used cocaine 3 days piror to this admission.   Assessment & Plan:   Principal Problem:   Acute metabolic encephalopathy   - this appears to be multifactorial and secondary to cocaine ingestion, ? Alcohol and ibuprofen intoxication - also now with ? Alcohol withdrawal this AM based on clinical presentation of agitation and tachycardia, restlessness - pt must remain in SDU for close monitoring - currently on CIWA protocol but with low threshold for initiating Precedx drip  - will continue IVF for now  - I am also worried for possible sepsis given elevated HR, lactic acid and WBC, ? Aspiration (please see discussion below)  Active Problems:   ? Sepsis - worried about underlying sepsis with tachycardia, elevated WBC up to 15 K, elevated lactic acid - urine with no evidence of UTI - CXR also fairly clear, I am worried about underlying aspiration however, given intoxication  - will ask for CT chest for clearer evaluation  - will check procalcitonin level, repeat lactic acid - blood cultures requested as well  - aspiration precaution, SLP eval once pt more alert  - hold ABX for now pending results of the studies above    Acute intoxication - ibuprofen per report, cocaine positive, also ? Alcohol use - keep on CIWA - avoid  nephrotoxins  - IVF  - once more medically stable, consider psych eval     Acute kidney injury - appears to be pre renal in etiology - continue with IVF, repeat BMP in AM    HTN (hypertension), essential - keep on home regimen Lisinopril for now    Alcohol withdrawal (HCC) - keep on CIWA protocol for now - low threshold for Precedex drip     Hyperkalemia - caution as pt on lisinopril, K is now WNL but if become high again, will need to hold ACEI    Lesion of hard palate - SLP eval for now - will likely need outpatient work up  DVT prophylaxis: Lovenox  Code Status: Full  Family Communication: No family at bedside, left my facecard at the bedside with my contact information for family to reach me  Disposition Plan: To be determined, currently not medically stable, requires SDU monitoring   Consultants:   None   Procedures:   None   Antimicrobials:   None   Subjective: Pt still restless, agitated, unable to follow commands appropriately.   Objective: Vitals:   03/26/16 0430 03/26/16 0500 03/26/16 0600 03/26/16 0942  BP: (!) 171/108 (!) 174/108 (!) 151/93 (!) 147/92  Pulse: 79 67 (!) 120   Resp: 19 13 (!) 62   Temp:    98.5 F (36.9 C)  TempSrc:    Oral  SpO2: 93% 95% 94%   Weight:      Height:  Intake/Output Summary (Last 24 hours) at 03/26/16 0950 Last data filed at 03/26/16 0810  Gross per 24 hour  Intake          2393.74 ml  Output             1200 ml  Net          1193.74 ml   Filed Weights   03/25/16 1510 03/26/16 0215  Weight: 63.5 kg (140 lb) 56.5 kg (124 lb 8 oz)    Examination:  General exam: Appears restless, has mittens on Respiratory system: diminished breath sounds at bases  Cardiovascular system: S1 & S2 heard, RRR. No JVD, rubs, gallops or clicks. No pedal edema. Gastrointestinal system: Abdomen is nondistended, soft and nontender. No organomegaly or masses felt. Central nervous system: restless, agitated, unable to answer  questions or follow commands appropriately   Data Reviewed: I have personally reviewed following labs and imaging studies  CBC:  Recent Labs Lab 03/25/16 0023 03/25/16 1639 03/26/16 0533  WBC 15.6* 15.2* 15.5*  NEUTROABS  --  13.0* 11.9*  HGB 16.7 16.3 15.7  HCT 48.8 48.6 47.0  MCV 88.6 89.5 88.8  PLT 293 355 326   Basic Metabolic Panel:  Recent Labs Lab 03/25/16 0023 03/25/16 1639 03/25/16 2059 03/25/16 2104 03/26/16 0533  NA  --  143  --   --  140  K  --  5.5* 4.7  --  3.9  CL  --  108  --   --  107  CO2  --  23  --   --  25  GLUCOSE  --  112*  --   --  98  BUN  --  12  --   --  10  CREATININE 1.44* 1.47*  --   --  1.28*  CALCIUM  --  9.9  --   --  9.0  MG  --   --   --  1.9  --    Liver Function Tests:  Recent Labs Lab 03/25/16 1639 03/26/16 0533  AST 19 28  ALT 13* 14*  ALKPHOS 95 95  BILITOT 1.0 1.2  PROT 8.5* 7.8  ALBUMIN 4.3 3.8    Recent Labs Lab 03/26/16 0533  AMMONIA 31   Cardiac Enzymes:  Recent Labs Lab 03/26/16 0533  TROPONINI <0.03   Urine analysis:    Component Value Date/Time   COLORURINE YELLOW 03/25/2016 1555   APPEARANCEUR CLEAR 03/25/2016 1555   LABSPEC 1.014 03/25/2016 1555   PHURINE 5.0 03/25/2016 1555   GLUCOSEU NEGATIVE 03/25/2016 1555   HGBUR NEGATIVE 03/25/2016 1555   BILIRUBINUR NEGATIVE 03/25/2016 1555   KETONESUR 20 (A) 03/25/2016 1555   PROTEINUR 30 (A) 03/25/2016 1555   UROBILINOGEN 0.2 11/05/2010 2248   NITRITE NEGATIVE 03/25/2016 1555   LEUKOCYTESUR NEGATIVE 03/25/2016 1555   Recent Results (from the past 240 hour(s))  MRSA PCR Screening     Status: None   Collection Time: 03/26/16  2:04 AM  Result Value Ref Range Status   MRSA by PCR NEGATIVE NEGATIVE Final    Radiology Studies: Dg Chest 2 View Result Date: 03/25/2016 No acute abnormality.   Ct Head Wo Contrast Result Date: 03/25/2016 No CT evidence of acute intracranial abnormality. 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   Scheduled Meds: . enoxaparin (LOVENOX) injection  40 mg Subcutaneous Q24H  . lisinopril  20  mg Oral Daily  . LORazepam      . sodium chloride flush  3 mL Intravenous Q12H  . sodium chloride flush  3 mL Intravenous Q12H   Continuous Infusions: . sodium chloride 125 mL/hr at 03/26/16 0153     LOS: 1 day   Time spent: 20 minutes   Debbora PrestoMAGICK-Ashleyann Shoun, MD [facility] Pager [phone]  If 7PM-7AM, please contact night-coverage [email or web address] Password [email or web address] 03/26/2016, 9:51 AM

## 2016-03-27 LAB — CBC WITH DIFFERENTIAL/PLATELET
BASOS ABS: 0 10*3/uL (ref 0.0–0.1)
BASOS PCT: 0 %
EOS ABS: 0.3 10*3/uL (ref 0.0–0.7)
EOS PCT: 3 %
HCT: 40.5 % (ref 39.0–52.0)
HEMOGLOBIN: 13.5 g/dL (ref 13.0–17.0)
Lymphocytes Relative: 13 %
Lymphs Abs: 1.7 10*3/uL (ref 0.7–4.0)
MCH: 28.9 pg (ref 26.0–34.0)
MCHC: 33.3 g/dL (ref 30.0–36.0)
MCV: 86.7 fL (ref 78.0–100.0)
Monocytes Absolute: 1.2 10*3/uL — ABNORMAL HIGH (ref 0.1–1.0)
Monocytes Relative: 9 %
NEUTROS PCT: 75 %
Neutro Abs: 9.7 10*3/uL — ABNORMAL HIGH (ref 1.7–7.7)
PLATELETS: 334 10*3/uL (ref 150–400)
RBC: 4.67 MIL/uL (ref 4.22–5.81)
RDW: 15.5 % (ref 11.5–15.5)
WBC: 13 10*3/uL — ABNORMAL HIGH (ref 4.0–10.5)

## 2016-03-27 LAB — GLUCOSE, CAPILLARY: Glucose-Capillary: 87 mg/dL (ref 65–99)

## 2016-03-27 LAB — COMPREHENSIVE METABOLIC PANEL
ALBUMIN: 3 g/dL — AB (ref 3.5–5.0)
ALK PHOS: 68 U/L (ref 38–126)
ALT: 11 U/L — AB (ref 17–63)
ANION GAP: 9 (ref 5–15)
AST: 18 U/L (ref 15–41)
BUN: 11 mg/dL (ref 6–20)
CALCIUM: 8.4 mg/dL — AB (ref 8.9–10.3)
CHLORIDE: 111 mmol/L (ref 101–111)
CO2: 20 mmol/L — AB (ref 22–32)
CREATININE: 1.18 mg/dL (ref 0.61–1.24)
GFR calc Af Amer: >60 mL/min (ref >60)
GFR calc non Af Amer: >60 mL/min (ref >60)
GLUCOSE: 85 mg/dL (ref 65–99)
Potassium: 3.7 mmol/L (ref 3.5–5.1)
SODIUM: 140 mmol/L (ref 135–145)
Total Bilirubin: 1.4 mg/dL — ABNORMAL HIGH (ref 0.3–1.2)
Total Protein: 6.4 g/dL — ABNORMAL LOW (ref 6.5–8.1)

## 2016-03-27 LAB — LACTIC ACID, PLASMA
LACTIC ACID, VENOUS: 1 mmol/L (ref 0.5–1.9)
LACTIC ACID, VENOUS: 1.1 mmol/L (ref 0.5–1.9)

## 2016-03-27 NOTE — Progress Notes (Signed)
Patient ID: Leonard HartshornRonald Forbes, male   DOB: 1955/10/03, 60 y.o.   MRN: [id number]    PROGRESS NOTE    Leonard HartshornRonald Forbes  WUJ:[id number] DOB: 1955/10/03 DOA: 03/25/2016  PCP: unknown who is PCP and pt unable to verbalize at this time due to altered mental status   Brief Narrative:  60 y.o. male with known hx of hypertension, stroke, alcohol abuse who was brought to the emergency department after having a syncopal episode at the urgent care center earlier in the day prior to this admission. [facility] sided hard [facility] lesion. Pt reported taking large amount of Ibuprofen. [facility], reported pt used cocaine 3 days piror to this admission.   Assessment & Plan:   Principal Problem:   Acute metabolic encephalopathy   - this appears to be multifactorial and secondary to cocaine ingestion, ? Alcohol and ibuprofen intoxication - also now with ? Alcohol withdrawal this AM based on clinical presentation of agitation and tachycardia, restlessness - appears more clear this AM but still confused and not able to answer all the questions appropriately  - currently on CIWA protocol but with low threshold for initiating Precedx drip  - will continue IVF for now  - no aspiration PNA noted on CT chest   Active Problems:   ? Sepsis - worried about underlying sepsis with tachycardia, elevated WBC up to 15 K, elevated lactic acid - urine with no evidence of UTI - CXR also fairly clear, I am worried about underlying aspiration however, CT chest ruled out aspiration PNA  - procalcitonin < 0.10 reassuring so sepsis unlikely  - blood cultures requested just per protocol, will follow up - SLP requested     Acute intoxication - ibuprofen per report, cocaine positive, also ? Alcohol use - keep on CIWA - avoid nephrotoxins  - IVF  - once more medically stable, consider psych eval      Acute kidney injury - appears to be pre renal in  etiology - IVF provided and Cr is now WNL  - BMP in AM    HTN (hypertension), essential - keep on home regimen Lisinopril for now    Alcohol withdrawal (HCC) - keep on CIWA protocol for now - low threshold for Precedex drip     Hyperkalemia - caution as pt on lisinopril, K is now WNL but if become high again, will need to hold ACEI    Lesion of hard palate - SLP eval for now - will likely need outpatient work up  DVT prophylaxis: Lovenox  Code Status: Full  Family Communication: No family at bedside, left my facecard at the bedside with my contact information for family to reach me  Disposition Plan: To be determined, currently not medically stable, requires SDU monitoring   Consultants:   None   Procedures:   None   Antimicrobials:   None   Subjective: Pt more calm this AM but still confused at times.   Objective: Vitals:   03/27/16 0313 03/27/16 0338 03/27/16 0700 03/27/16 1300  BP:  (!) 157/101    Pulse:  90    Resp:  19    Temp:  99.1 F (37.3 C) 98.8 F (37.1 C) 97 F (36.1 C)  TempSrc:  Axillary Axillary Axillary  SpO2:  98%    Weight: 57.2 kg (126 lb 3 oz)     Height:        Intake/Output Summary (Last  24 hours) at 03/27/16 1445 Last data filed at 03/27/16 1006  Gross per 24 hour  Intake          2860.17 ml  Output              400 ml  Net          2460.17 ml   Filed Weights   03/25/16 1510 03/26/16 0215 03/27/16 0313  Weight: 63.5 kg (140 lb) 56.5 kg (124 lb 8 oz) 57.2 kg (126 lb 3 oz)    Examination:  General exam: Appears calm, NAD Respiratory system: diminished breath sounds at bases  Cardiovascular system: S1 & S2 heard, RRR. No JVD, rubs, gallops or clicks. No pedal edema. Gastrointestinal system: Abdomen is nondistended, soft and nontender. No organomegaly or masses felt. Central nervous system: able to answer some questions and follows some commands appropriately   Data Reviewed: I have personally reviewed following labs and  imaging studies  CBC:  Recent Labs Lab 03/25/16 0023 03/25/16 1639 03/26/16 0533 03/27/16 0306  WBC 15.6* 15.2* 15.5* 13.0*  NEUTROABS  --  13.0* 11.9* 9.7*  HGB 16.7 16.3 15.7 13.5  HCT 48.8 48.6 47.0 40.5  MCV 88.6 89.5 88.8 86.7  PLT 293 355 326 334   Basic Metabolic Panel:  Recent Labs Lab 03/25/16 0023 03/25/16 1639 03/25/16 2059 03/25/16 2104 03/26/16 0533 03/27/16 0306  NA  --  143  --   --  140 140  K  --  5.5* 4.7  --  3.9 3.7  CL  --  108  --   --  107 111  CO2  --  23  --   --  25 20*  GLUCOSE  --  112*  --   --  98 85  BUN  --  12  --   --  10 11  CREATININE 1.44* 1.47*  --   --  1.28* 1.18  CALCIUM  --  9.9  --   --  9.0 8.4*  MG  --   --   --  1.9  --   --    Liver Function Tests:  Recent Labs Lab 03/25/16 1639 03/26/16 0533 03/27/16 0306  AST 19 28 18   ALT 13* 14* 11*  ALKPHOS 95 95 68  BILITOT 1.0 1.2 1.4*  PROT 8.5* 7.8 6.4*  ALBUMIN 4.3 3.8 3.0*    Recent Labs Lab 03/26/16 0533  AMMONIA 31   Cardiac Enzymes:  Recent Labs Lab 03/26/16 0533  TROPONINI <0.03   Urine analysis:    Component Value Date/Time   COLORURINE YELLOW 03/25/2016 1555   APPEARANCEUR CLEAR 03/25/2016 1555   LABSPEC 1.014 03/25/2016 1555   PHURINE 5.0 03/25/2016 1555   GLUCOSEU NEGATIVE 03/25/2016 1555   HGBUR NEGATIVE 03/25/2016 1555   BILIRUBINUR NEGATIVE 03/25/2016 1555   KETONESUR 20 (A) 03/25/2016 1555   PROTEINUR 30 (A) 03/25/2016 1555   UROBILINOGEN 0.2 11/05/2010 2248   NITRITE NEGATIVE 03/25/2016 1555   LEUKOCYTESUR NEGATIVE 03/25/2016 1555   Recent Results (from the past 240 hour(s))  MRSA PCR Screening     Status: None   Collection Time: 03/26/16  2:04 AM  Result Value Ref Range Status   MRSA by PCR NEGATIVE NEGATIVE Final    Radiology Studies: Dg Chest 2 View Result Date: 03/25/2016 No acute abnormality.   Ct Head Wo Contrast Result Date: 03/25/2016 No CT evidence of acute intracranial abnormality. 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   Scheduled Meds: . enoxaparin (LOVENOX) injection  40 mg Subcutaneous Q24H  . feeding supplement (ENSURE ENLIVE)  237 mL Oral BID BM  . lisinopril  20 mg Oral Daily  . sodium chloride flush  3 mL Intravenous Q12H  . sodium chloride flush  3 mL Intravenous Q12H   Continuous Infusions: . sodium chloride 125 mL/hr at 03/27/16 1037     LOS: 2 days   Time spent: 20 minutes   Debbora Presto, MD [facility] Pager [phone]  If 7PM-7AM, please contact night-coverage [email or web address] Password [email or web address] 03/27/2016, 2:45 PM

## 2016-03-27 NOTE — Evaluation (Signed)
Clinical/Bedside Swallow Evaluation Patient Details  Name: Leonard HartshornRonald Forbes MRN: [id number] Date of Birth: 1955/07/10  Today's Date: 03/27/2016 Time: SLP Start Time (ACUTE ONLY): 1347 SLP Stop Time (ACUTE ONLY): 1355 SLP Time Calculation (min) (ACUTE ONLY): 8 min  Past Medical History:  Past Medical History:  Diagnosis Date  . Headache   . Headache disorder 06/22/2014  . 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 Hypertension   . Stroke [facility](HCC)    Past Surgical History:  Past Surgical History:  Procedure Laterality Date  . CAROTID ENDARTERECTOMY    . CRANIOTOMY     with clipping  . orif mandible Left   . VENTRICULOPERITONEAL SHUNT     HPI:  60 y.o.malewith known hx ofhypertension, stroke, alcohol abuse who was brought to the emergency department after having a syncopal episode at the urgent care center earlier in the day prior to this admission. [facility]ided [facility]. Pt reported taking large amount of Ibuprofen. [facility] reported pt used cocaine 3 days piror to this admission. Dx with acute metabolic encephalopathy, multifactorial secondary to cocaine ingenstion, ? ETOH.    Assessment / Plan / Recommendation Clinical Impression  Evaluation limited this pm as patient very lethargic (received Ativan prior to evaluation per RN) but with intermittent periods of agitation (arm in air, feet hanging over bedrails). Patient unable to follow directions, non-verbal, but did open mouth to orally accept a bolus with max tactile cueing. Patient with poor sustained attention resulting in little to no oral manipulation of bolus and no swallow initiation. Patient inappropriate for further po trials at this time. Per CNA and RN this am, patient consumed thin liquids well without overt indication of aspiration, pocketed solid foods. Mentation is largest contributing factor to aspiration at this time with  risk being high if patient is not alert. Based on performance this am, recommend po intake only when fully alert. SLP will continue to f/u for diagnostic treatment at bedside. Please hold pos with any s/s of aspiration.     Aspiration Risk  Moderate aspiration risk    Diet Recommendation Regular;Thin liquid   Liquid Administration via: Cup;Straw Medication Administration: Whole meds with puree Supervision: Staff to assist with self feeding;Full supervision/cueing for compensatory strategies Compensations: Slow rate;Small sips/bites Postural Changes: Seated upright at 90 degrees    Other  Recommendations Oral Care Recommendations: Oral care BID   Follow up Recommendations None      Frequency and Duration min 1 x/week  1 week       Prognosis        Swallow Study   General HPI: 60 y.o.malewith known hx ofhypertension, stroke, alcohol abuse who was brought to the emergency department after having a syncopal episode at the urgent care center earlier in the day prior to this admission. [facility]ided [facility]. Pt reported taking large amount of Ibuprofen. [facility] reported pt used cocaine 3 days piror to this admission. Dx with acute metabolic encephalopathy, multifactorial secondary to cocaine ingenstion, ? ETOH.  Type of Study: Bedside Swallow Evaluation Previous Swallow Assessment: none Diet Prior to this Study: Regular;Thin liquids Temperature Spikes Noted: No Respiratory Status: Room air History of Recent Intubation: No Behavior/Cognition: Lethargic/Drowsy;Agitated Oral Cavity Assessment: Within Functional Limits Oral Care Completed by SLP: Recent completion by staff Vision:  (unknown) Self-Feeding  Abilities: Total assist Patient Positioning: Upright in bed Baseline Vocal Quality: Not observed (non-verbal) Volitional Cough: Cognitively unable to elicit Volitional Swallow: Unable to  elicit    Oral/Motor/Sensory Function Overall Oral Motor/Sensory Function: Within functional limits ([facility] although unable to formally assess)   Ice Chips Ice chips: Impaired Presentation: Spoon Oral Phase Impairments: Reduced labial seal;Impaired mastication;Poor awareness of bolus;Reduced lingual movement/coordination Pharyngeal Phase Impairments: Other (comments) (no swallow initiated)   Thin Liquid Thin Liquid: Not tested    Nectar Thick Nectar Thick Liquid: Not tested   Honey Thick Honey Thick Liquid: Not tested   Puree Puree: Not tested   Solid   Leonard  Dae Highley MA, CCC-SLP [phone]  Solid: Not tested        Leonard Forbes 03/27/2016,2:53 PM

## 2016-03-28 ENCOUNTER — Inpatient Hospital Stay (HOSPITAL_COMMUNITY): Payer: Medicare Other

## 2016-03-28 DIAGNOSIS — I472 Ventricular tachycardia: Secondary | ICD-10-CM

## 2016-03-28 DIAGNOSIS — I6200 Nontraumatic subdural hemorrhage, unspecified: Secondary | ICD-10-CM

## 2016-03-28 DIAGNOSIS — G9341 Metabolic encephalopathy: Secondary | ICD-10-CM

## 2016-03-28 LAB — PROCALCITONIN: Procalcitonin: <0.1 ng/mL

## 2016-03-28 LAB — CBC
HEMATOCRIT: 38.7 % — AB (ref 39.0–52.0)
Hemoglobin: 13 g/dL (ref 13.0–17.0)
MCH: 29.4 pg (ref 26.0–34.0)
MCHC: 33.6 g/dL (ref 30.0–36.0)
MCV: 87.6 fL (ref 78.0–100.0)
PLATELETS: 349 10*3/uL (ref 150–400)
RBC: 4.42 MIL/uL (ref 4.22–5.81)
RDW: 15.5 % (ref 11.5–15.5)
WBC: 9.2 10*3/uL (ref 4.0–10.5)

## 2016-03-28 LAB — BASIC METABOLIC PANEL
ANION GAP: 10 (ref 5–15)
BUN: 9 mg/dL (ref 6–20)
CO2: 20 mmol/L — ABNORMAL LOW (ref 22–32)
Calcium: 9 mg/dL (ref 8.9–10.3)
Chloride: 110 mmol/L (ref 101–111)
Creatinine, Ser: 1.09 mg/dL (ref 0.61–1.24)
GFR calc Af Amer: >60 mL/min (ref >60)
GLUCOSE: 89 mg/dL (ref 65–99)
POTASSIUM: 3.8 mmol/L (ref 3.5–5.1)
Sodium: 140 mmol/L (ref 135–145)

## 2016-03-28 LAB — GLUCOSE, CAPILLARY
GLUCOSE-CAPILLARY: 80 mg/dL (ref 65–99)
GLUCOSE-CAPILLARY: 87 mg/dL (ref 65–99)

## 2016-03-28 LAB — MAGNESIUM: MAGNESIUM: 1.7 mg/dL (ref 1.7–2.4)

## 2016-03-28 MED ORDER — MAGNESIUM SULFATE 4 GM/100ML IV SOLN
4.0000 g | Freq: Once | INTRAVENOUS | Status: AC
  Administered 2016-03-28: 4 g via INTRAVENOUS
  Filled 2016-03-28: qty 100

## 2016-03-28 MED ORDER — POTASSIUM CHLORIDE CRYS ER 20 MEQ PO TBCR
20.0000 meq | EXTENDED_RELEASE_TABLET | Freq: Once | ORAL | Status: DC
  Filled 2016-03-28: qty 1

## 2016-03-28 MED ORDER — DILTIAZEM HCL 25 MG/5ML IV SOLN
10.0000 mg | INTRAVENOUS | Status: DC | PRN
  Administered 2016-03-28 – 2016-03-29 (×2): 10 mg via INTRAVENOUS
  Filled 2016-03-28 (×4): qty 5

## 2016-03-28 MED ORDER — CHLORDIAZEPOXIDE HCL 5 MG PO CAPS
25.0000 mg | ORAL_CAPSULE | Freq: Three times a day (TID) | ORAL | Status: DC
  Administered 2016-03-29 – 2016-04-01 (×11): 25 mg via ORAL
  Filled 2016-03-28 (×3): qty 1
  Filled 2016-03-28 (×2): qty 5
  Filled 2016-03-28: qty 1
  Filled 2016-03-28 (×5): qty 5
  Filled 2016-03-28: qty 1

## 2016-03-28 MED ORDER — HYDRALAZINE HCL 20 MG/ML IJ SOLN
10.0000 mg | Freq: Four times a day (QID) | INTRAMUSCULAR | Status: DC | PRN
Start: 2016-03-28 — End: 2016-04-10
  Administered 2016-03-28 – 2016-04-04 (×5): 10 mg via INTRAVENOUS
  Filled 2016-03-28 (×5): qty 1

## 2016-03-28 MED ORDER — HYDRALAZINE HCL 20 MG/ML IJ SOLN
10.0000 mg | Freq: Once | INTRAMUSCULAR | Status: AC
  Administered 2016-03-28: 10 mg via INTRAVENOUS
  Filled 2016-03-28: qty 1

## 2016-03-28 NOTE — Progress Notes (Signed)
PROGRESS NOTE                                                                                                                                                                                                             Patient Demographics:    Leonard Forbes, is a 60 y.o. male, DOB - 1955-09-14, GNF:[id number]  Admit date - 03/25/2016   Admitting Physician Bobette Moavid Manuel Ortiz, MD  Outpatient Primary MD for the patient is No PCP Per Patient  LOS - 3  Outpatient Specialists:None  Chief Complaint  Patient presents with  . Mouth Injury       Brief Narrative   60 year old male wit active alcohol and cocaine abuse, history of stroke and hypertension brought to the ED after having a syncopal episode at the urgent care on the day prior to admission. Patient was at the urgent care center for evaluation of right-sided hard palate lesion. He reportedly took a large amount of ibuprofen and use cocaine 3 days prior to admission. Patient brought to the ED and was found to be encephalopathic concerning for alcohol intoxication. Hospital course prolonged with alcohol withdrawal, fall with finding of small subacute subdural hematoma.   Subjective:    Patient somnolent and poorly arousable after receiving Ativan this morning. Tachycardic with multiple PVCs and NSVT on telemetry.   Assessment  & Plan :   Principal problem Acute metabolic encephalopathy Likely combination of alcohol intoxication and withdrawal, cocaine and seen and? subacute subdural hematoma. Patient is an active withdrawal with CIWA of 13-14. Monitor with when necessary Ativan. Added scheduled Librium. Continue IV fluids. Continue thiamine, folate and multivitamin.  Acute kidney injury Prerenal secondary to dehydration. Improved with fluids.  Uncontrolled hypertension Secondary to alcohol withdrawal. Continue lisinopril. Added when necessary hydralazine.  PVCs and  NSVT Replenish potassium and magnesium.   Fall on 12/21 Patient fell from bed when attempting to get up during the night. A head CT showed small subdural collection measuring 4 mm (subdural to chronic). Suggests this is likely present on admission head CT. Shows a trace 2-3 mm left-to-right shift. Mitral clinically for now. Avoid heparin products. Follow-up head CT in 48 hours. If neurological changes noted will need early follow-up and neurosurgical consultation.  Hard palate lesion Seen by SLP and cleared for regular diet. Needs outpatient follow-up.  ? Sepsis Patient tachycardic,  mild leukocytosis and elevated lactic acid. No clinical signs of infection. Received IV fluid bolus. UA unremarkable. CT chest negative for aspiration.   Prominent ascending thoracic aorta Measures 44 cm. Recommend annual imaging with CT angiogram or MRI.    Code Status : Full code  Family Communication  : None at bedside  Disposition Plan  : Pending hospital course  Barriers For Discharge : Active alcohol withdrawal/encephalopathy  Consults  :  None  Procedures  : Head CT x2  DVT Prophylaxis  :  Cities  Lab Results  Component Value Date   PLT 349 03/28/2016    Antibiotics  :    Anti-infectives    None        Objective:   Vitals:   03/28/16 0411 03/28/16 0800 03/28/16 1104 03/28/16 1210  BP:  (!) 151/124 (!) 145/100 (!) 147/96  Pulse:  (!) 131  (!) 130  Resp:  (!) 21  (!) 31  Temp:  98.2 F (36.8 C)  97.8 F (36.6 C)  TempSrc:  Oral  Axillary  SpO2:  99%  98%  Weight: 56.5 kg (124 lb 9.6 oz)  56.9 kg (125 lb 8 oz)   Height:        Wt Readings from Last 3 Encounters:  03/28/16 56.9 kg (125 lb 8 oz)  07/07/14 65.1 kg (143 lb 9.6 oz)  06/22/14 65.3 kg (144 lb)     Intake/Output Summary (Last 24 hours) at 03/28/16 1507 Last data filed at 03/28/16 1101  Gross per 24 hour  Intake           1548.5 ml  Output             1300 ml  Net            248.5 ml     Physical  Exam  Gen: Somnolent, poorly arousable HEENT: Pupils reactive bilaterally, moist mucosa, supple neck Chest: clear b/l, no added sounds CVS: None S2 tachycardic, no murmurs rubs or gallop GI: soft, NT, ND, BS+ Musculoskeletal: warm, no edema CNS: Somnolent, poorly arousable, moves all extremities.    Data Review:    CBC  Recent Labs Lab 03/25/16 0023 03/25/16 1639 03/26/16 0533 03/27/16 0306 03/28/16 0531  WBC 15.6* 15.2* 15.5* 13.0* 9.2  HGB 16.7 16.3 15.7 13.5 13.0  HCT 48.8 48.6 47.0 40.5 38.7*  PLT 293 355 326 334 349  MCV 88.6 89.5 88.8 86.7 87.6  MCH 30.3 30.0 29.7 28.9 29.4  MCHC 34.2 33.5 33.4 33.3 33.6  RDW 15.6* 16.2* 15.8* 15.5 15.5  LYMPHSABS  --  1.4 2.3 1.7  --   MONOABS  --  0.8 1.2* 1.2*  --   EOSABS  --  0.0 0.0 0.3  --   BASOSABS  --  0.0 0.0 0.0  --     Chemistries   Recent Labs Lab 03/25/16 0023 03/25/16 1639 03/25/16 2059 03/25/16 2104 03/26/16 0533 03/27/16 0306 03/28/16 0531 03/28/16 1032  NA  --  143  --   --  140 140 140  --   K  --  5.5* 4.7  --  3.9 3.7 3.8  --   CL  --  108  --   --  107 111 110  --   CO2  --  23  --   --  25 20* 20*  --   GLUCOSE  --  112*  --   --  98 85 89  --   BUN  --  12  --   --  10 11 9   --   CREATININE 1.44* 1.47*  --   --  1.28* 1.18 1.09  --   CALCIUM  --  9.9  --   --  9.0 8.4* 9.0  --   MG  --   --   --  1.9  --   --   --  1.7  AST  --  19  --   --  28 18  --   --   ALT  --  13*  --   --  14* 11*  --   --   ALKPHOS  --  95  --   --  95 68  --   --   BILITOT  --  1.0  --   --  1.2 1.4*  --   --    ------------------------------------------------------------------------------------------------------------------ No results for input(s): CHOL, HDL, LDLCALC, TRIG, CHOLHDL, LDLDIRECT in the last 72 hours.  No results found for: HGBA1C ------------------------------------------------------------------------------------------------------------------ No results for input(s): TSH, T4TOTAL, T3FREE,  THYROIDAB in the last 72 hours.  Invalid input(s): FREET3 ------------------------------------------------------------------------------------------------------------------ No results for input(s): VITAMINB12, FOLATE, FERRITIN, TIBC, IRON, RETICCTPCT in the last 72 hours.  Coagulation profile No results for input(s): INR, PROTIME in the last 168 hours.  No results for input(s): DDIMER in the last 72 hours.  Cardiac Enzymes  Recent Labs Lab 03/26/16 0533  TROPONINI <0.03   ------------------------------------------------------------------------------------------------------------------ No results found for: BNP  Inpatient Medications  Scheduled Meds: . feeding supplement (ENSURE ENLIVE)  237 mL Oral BID BM  . lisinopril  20 mg Oral Daily  . potassium chloride  20 mEq Oral Once  . sodium chloride flush  3 mL Intravenous Q12H  . sodium chloride flush  3 mL Intravenous Q12H   Continuous Infusions: . sodium chloride 100 mL (03/28/16 1220)   PRN Meds:.hydrALAZINE, LORazepam, ondansetron **OR** ondansetron (ZOFRAN) IV  Micro Results Recent Results (from the past 240 hour(s))  MRSA PCR Screening     Status: None   Collection Time: 03/26/16  2:04 AM  Result Value Ref Range Status   MRSA by PCR NEGATIVE NEGATIVE Final    Comment:        The GeneXpert MRSA Assay (FDA approved for NASAL specimens only), is one component of a comprehensive MRSA colonization surveillance program. It is not intended to diagnose MRSA infection nor to guide or monitor treatment for MRSA infections.   Culture, blood (routine x 2)     Status: None (Preliminary result)   Collection Time: 03/26/16 10:20 AM  Result Value Ref Range Status   Specimen Description BLOOD BLOOD LEFT ARM  Final   Special Requests IN PEDIATRIC BOTTLE 2CC  Final   Culture NO GROWTH 2 DAYS  Final   Report Status PENDING  Incomplete  Culture, blood (routine x 2)     Status: None (Preliminary result)   Collection Time:  03/26/16  1:30 PM  Result Value Ref Range Status   Specimen Description BLOOD BLOOD LEFT ARM  Final   Special Requests IN PEDIATRIC BOTTLE 1.0 CC  Final   Culture NO GROWTH 2 DAYS  Final   Report Status PENDING  Incomplete    Radiology Reports Dg Chest 2 View  Result Date: 03/25/2016 CLINICAL DATA:  Syncope.  Ibuprofen overdose. EXAM: CHEST  2 VIEW COMPARISON:  11/06/2010. FINDINGS: Normal sized heart. Tortuous aorta. Clear lungs with normal vascularity. Right ventriculoperitoneal shunt tube. Unremarkable bones. IMPRESSION: No acute abnormality. Electronically Signed  By: Beckie Salts M.D.   On: 03/25/2016 16:28   Ct Head Wo Contrast  Result Date: 03/28/2016 CLINICAL DATA:  Initial evaluation for acute altered mental status, fall out of that. EXAM: CT HEAD WITHOUT CONTRAST TECHNIQUE: Contiguous axial images were obtained from the base of the skull through the vertex without intravenous contrast. COMPARISON:  Comparison made with prior CT from 03/25/2016. FINDINGS: Brain: Cerebral atrophy with chronic microvascular ischemic disease noted, stable. Right parietal approach VP shunt catheter remains in place with tip at the left basal ganglia, stable. Ventricular size is unchanged without evidence for hydrocephalus. Streak artifact from surgical clips from prior aneurysm repair again seen. No evidence for acute intracranial hemorrhage. No findings to suggest acute large vessel territory infarct. No mass lesion, midline shift, or mass effect. There is a left holo hemispheric subdural collection overlying the left cerebral hemisphere. This measures up to 4 mm in maximal thickness. Collection is largely I so to low-density as compared to the adjacent brain, compatible with a sub acute collection. No appreciable there is trace 3 mm left-to-right shift. Upon review of prior CT of from 03/25/2016, this collection appears to have been present on previous exam, although extremely difficult to visualize on that  study, as this collection was only trace in size on that exam. Trace left-to-right shift is not significantly changed. No hyperdense component within this collection to suggest that this has acutely bled with recent fall. This collection is suspected to at least in part be related to the patient's shunt. No other extra-axial fluid collection. Vascular: No hyperdense vessel, although evaluation limited by streak artifact from aneurysm clip. Skull: Scalp soft tissues demonstrate no acute abnormality. Calvarium unchanged. Postoperative changes from prior right pterional craniotomy again seen. Sinuses/Orbits: Globes and orbital soft tissues within normal limits. Scattered mucosal thickening within the ethmoidal air cells and right maxillary sinus. Paranasal sinuses are otherwise clear. No mastoid effusion. IMPRESSION: 1. Left holo hemispheric subdural collection measuring up to 4 mm as above, subacute to chronic in appearance. Although not mentioned previously, this collection was likely present on previous exam from 03/25/2016, although extremely difficult to visualize on that study as this was only trace in size at that time. The apparent interval increase in size suspected to be related to shunting. No internal hyperdensity to suggest that this has acutely bled with recent fall. 2. Trace 2-3 mm of left-to-right shift, relatively stable. 3. No other acute intracranial process. 4. 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 5. Right parietal approach VP shunt in stable position. No hydrocephalus. Electronically Signed   By: Rise Mu M.D.   On: 03/28/2016 01:59   Ct Head Wo Contrast  Result Date: 03/25/2016 CLINICAL DATA:  60 year old male with a history of altered mental status EXAM: CT HEAD WITHOUT CONTRAST TECHNIQUE: Contiguous axial images were obtained from the base of the skull through the vertex without intravenous  contrast. COMPARISON:  07/31/2014, 11/06/2010 FINDINGS: Brain: No acute intracranial hemorrhage. No midline shift or mass effect. Unremarkable appearance of the ventricular system. 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 Skull: Surgical changes of right pterional craniotomy for surgical clipping of aneurysm. No acute bony abnormality. Right posterior parietal approach ventriculostomy terminates to the left of midline. Sinuses/Orbits: No significant sinus disease. Other: None IMPRESSION: No CT evidence of acute intracranial abnormality. 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 Signed, Yvone Neu. Loreta Ave, DO [facility] Radiology Electronically Signed   By: Gilmer Mor D.O.   On: 03/25/2016 20:30   Ct Chest Wo Contrast  Result Date: 03/26/2016 CLINICAL DATA:  Shortness of breath. Concern for aspiration pneumonitis EXAM: CT CHEST WITHOUT CONTRAST TECHNIQUE: Multidetector CT imaging of the chest was performed following the standard protocol without IV contrast. COMPARISON:  Chest radiograph March 25, 2016 FINDINGS: Cardiovascular: The ascending thoracic aortic diameter is measured at 4.0 x 4.0 cm, measured on axial slice 59 series 201. The thoracic aorta is somewhat ectatic. There are scattered foci of atherosclerotic calcification in the aorta. The visualized great vessels appear unremarkable on this noncontrast enhanced study. There is slight pericardial thickening without pericardial effusion evident. Mediastinum/Nodes: Thyroid appears unremarkable. There is no evident thoracic adenopathy. Lungs/Pleura: There is mild scarring in the lung apices with slight bullous disease in the extreme apices bilaterally. There are scattered small bullae throughout the lungs  bilaterally. There is bibasilar atelectatic change, slightly more on the right than on the left. There is no frank edema or consolidation. There is no pleural effusion or pleural thickening evident. Upper Abdomen: Visualized upper abdominal structures appear unremarkable. Musculoskeletal: There are no blastic or lytic bone lesions. IMPRESSION: Bibasilar atelectatic change, slightly more on the right than on the left, likely due to dependent positioning. No edema or consolidation. No findings felt to represent aspiration pneumonitis. No adenopathy. Prominence of the ascending thoracic aorta with a measured diameter 4.0 x 4.0 cm. Recommend annual imaging followup by CTA or MRA. This recommendation follows 2010 ACCF/AHA/AATS/ACR/ASA/SCA/SCAI/SIR/STS/SVM Guidelines for the Diagnosis and Management of Patients with Thoracic Aortic Disease. Circulation. 2010; 121: R604-V409. Thoracic aorta somewhat ectatic with scattered foci of atherosclerotic calcification. Borderline pericardial thickening without pericardial effusion. Electronically Signed   By: Bretta Bang III M.D.   On: 03/26/2016 10:48    Time Spent in minutes  35   Eddie North M.D on 03/28/2016 at 3:07 PM  Between 7am to 7pm - Pager - [phone]  After 7pm go to [email or web address] - password [facility]  [facility] -  Office  [phone]

## 2016-03-28 NOTE — Progress Notes (Signed)
SLP Cancellation Note  Patient Details Name: Leonard Forbes MRN: [id number] DOB: 1955/08/11   Cancelled treatment:       Reason Eval/Treat Not Completed: Other (comment)- pt has had Ativan; not sufficiently alert for participation.  He is currently on a PO diet.  Please call SLP over w/e if having difficulty tolerating POs.  Pager [phone].   Blenda MountsCouture, Taneeka Curtner [name] 03/28/2016, 10:21 AM

## 2016-03-28 NOTE — Plan of Care (Signed)
Problem: Education: Goal: Understanding of discharge needs will improve Outcome: Not Progressing Pt is still experiencing withdrawal symptoms per CIWA protocol.

## 2016-03-28 NOTE — Progress Notes (Signed)
Speech Language Pathology Treatment: Dysphagia  Patient Details Name: Leonard Forbes MRN: [id number] DOB: 10-08-1955 Today's Date: 03/28/2016 Time: 1220-1230 SLP Time Calculation (min) (ACUTE ONLY): 10 min  Assessment / Plan / Recommendation Clinical Impression  Returned while pt was working with PT at EOB during brief state of wakefulness.  When alert, pt with adequate mastication, bolus attention, palpable swallow with no overt s/s of aspiration.  He had one episode of coughing with a cracker, but it was not repeated, and pt appeared to tolerate POs without concerns for dysphagia.  Recommend continuing to send regular tray, thin liquids, meds whole with puree.  Clearly pt should not be given tray when he is somnolent; no true dysphagia.  SLP services will sign off.    HPI HPI: 60 y.o.malewith known hx ofhypertension, stroke, alcohol abuse who was brought to the emergency department after having a syncopal episode at the urgent care center earlier in the day prior to this admission. Pt was apparently at the [facility] for evaluation of right sided hard palate lesion. Pt reported taking large amount of Ibuprofen. Girlfriend who was present with pt at the [facility], reported pt used cocaine 3 days piror to this admission. Dx with acute metabolic encephalopathy, multifactorial secondary to cocaine ingenstion, ? ETOH.       SLP Plan  All goals met     Recommendations  Diet recommendations: Regular;Thin liquid Medication Administration: Whole meds with puree Supervision: Staff to assist with self feeding Compensations: Slow rate;Small sips/bites                Oral Care Recommendations: Oral care BID Plan: All goals met       GO              Leonard Forbes, [place] CCC/SLP Pager [phone]   Leonard Forbes 03/28/2016, 12:41 PM

## 2016-03-28 NOTE — Progress Notes (Signed)
Was told during report that patient suffered a fall 12/21. Head CT was completed. Craige CottaKirby, NP notified. Will continue to monitor.

## 2016-03-28 NOTE — Progress Notes (Signed)
PT Cancellation Note  Patient Details Name: Leonard HartshornRonald Forbes MRN: [id number] DOB: [birth year]   Cancelled Treatment:    Reason Eval/Treat Not Completed: Fatigue/lethargy limiting ability to participate. Pt received ativan earlier. Will reattempt later.   Angelina OkCary W Maycok 03/28/2016, 11:17 AM Skip Mayerary Xandra Laramee PT [phone]

## 2016-03-28 NOTE — Evaluation (Signed)
Physical Therapy Evaluation Patient Details Name: Leonard HartshornRonald Forbes MRN: [id number] DOB: 1956-02-21 Today's Date: 03/28/2016   History of Present Illness  Pt adm with syncope. Pt with ?sepsis and now with ETOH withdrawal. Pt with fall on 12/21 with CT showing small subacute SDH present on admission. PMH - polysubstance abuse. aneurysm, craniotomy, vp shunt, HTN, CVA  Clinical Impression  Pt admitted with above diagnosis and presents to PT with functional limitations due to deficits listed below (See PT problem list). Pt needs skilled PT to maximize independence and safety to allow discharge to prior living environment. Expect as pt's withdrawal symptoms improve his mobility and cognition will improve.      Follow Up Recommendations Supervision/Assistance - 24 hour;Other (comment) (Venue to be determined when home environment and plof known)    Equipment Recommendations  Other (comment) (To be determined)    Recommendations for Other Services       Precautions / Restrictions Precautions Precautions: Fall Precaution Comments: etoh withdrawal Restrictions Weight Bearing Restrictions: No      Mobility  Bed Mobility Overal bed mobility: Needs Assistance Bed Mobility: Supine to Sit;Sit to Supine     Supine to sit: Mod assist Sit to supine: Mod assist   General bed mobility comments: Assist to elevate trunk into sitting and bring hips to EOB. Assist to lower trunk and bring legs back up into bed  Transfers Overall transfer level: Needs assistance Equipment used: 2 person hand held assist Transfers: Sit to/from Stand Sit to Stand: +2 physical assistance;Mod assist         General transfer comment: Assist to bring hips up and for balance  Ambulation/Gait             General Gait Details: Unable to safely attempt  Stairs            Wheelchair Mobility    Modified Rankin (Stroke Patients Only)       Balance Overall balance assessment: Needs  assistance Sitting-balance support: Feet supported;No upper extremity supported Sitting balance-Leahy Scale: Poor Sitting balance - Comments: Sat EOB x 15 minutes with min to min guard assist. At times pt with anterior or posterior. lean Postural control: Posterior lean;Other (comment) (anterior lean)                                   Pertinent Vitals/Pain Pain Assessment: Faces Faces Pain Scale: No hurt    Home Living Family/patient expects to be discharged to:: Unsure                 Additional Comments: Pt unable to state and no family present    Prior Function           Comments: Pt unable to state and no family present. Likely independent.     Hand Dominance        Extremity/Trunk Assessment   Upper Extremity Assessment Upper Extremity Assessment: Generalized weakness    Lower Extremity Assessment Lower Extremity Assessment: Generalized weakness       Communication      Cognition Arousal/Alertness: Lethargic Behavior During Therapy: Flat affect Overall Cognitive Status: Impaired/Different from baseline Area of Impairment: Orientation;Attention;Memory;Following commands;Safety/judgement;Problem solving Orientation Level: Disoriented to;Place;Time;Situation Current Attention Level: Sustained Memory: Decreased short-term memory Following Commands: Follows one step commands inconsistently Safety/Judgement: Decreased awareness of safety;Decreased awareness of deficits   Problem Solving: Slow processing;Decreased initiation;Difficulty sequencing;Requires verbal cues;Requires tactile cues      General Comments  Exercises     Assessment/Plan    PT Assessment Patient needs continued PT services  PT Problem List Decreased strength;Decreased activity tolerance;Decreased balance;Decreased mobility;Decreased cognition;Decreased safety awareness          PT Treatment Interventions DME instruction;Gait training;Stair  training;Functional mobility training;Therapeutic activities;Therapeutic exercise;Balance training;Cognitive remediation;Patient/family education    PT Goals (Current goals can be found in the Care Plan section)  Acute Rehab PT Goals Patient Stated Goal: Pt unable to stat PT Goal Formulation: Patient unable to participate in goal setting Time For Goal Achievement: 04/04/16 Potential to Achieve Goals: Good    Frequency Min 3X/week   Barriers to discharge        Co-evaluation PT/OT/SLP Co-Evaluation/Treatment: Yes Reason for Co-Treatment: Necessary to address cognition/behavior during functional activity;For patient/therapist safety PT goals addressed during session: Mobility/safety with mobility;Balance         End of Session   Activity Tolerance: Patient limited by lethargy Patient left: in bed;with call bell/phone within reach;with bed alarm set;Other (comment) Public affairs consultant(virtual sitter) Nurse Communication: Mobility status         Time: [phone] PT Time Calculation (min) (ACUTE ONLY): 24 min   Charges:   PT Evaluation $PT Eval Moderate Complexity: 1 Procedure     PT G CodesAngelina Ok:        Zahari Fazzino W Maycok 03/28/2016, 4:24 PM Fluor CorporationCary Cannen Dupras PT [phone]

## 2016-03-28 NOTE — Progress Notes (Signed)
LATE ENTRY:  Patient had several attempts to get out of bed.  Bed alarm is on and bed is placed on low position.  Non-skid socks is on.  Encouraged patient to stay in bed and if he needed to get out of bed, he needs to use the call bell.  Patient exhibits no signs or symptoms of understanding.  Telesitter monitoring ordered from MD.  Family is aware of patient's status.  Spoke with Leonard Forbes, Leonard Forbes  And he stated that, "he fell again?  He always fall."

## 2016-03-29 LAB — BASIC METABOLIC PANEL
Anion gap: 9 (ref 5–15)
BUN: 10 mg/dL (ref 6–20)
CALCIUM: 8.7 mg/dL — AB (ref 8.9–10.3)
CO2: 19 mmol/L — ABNORMAL LOW (ref 22–32)
CREATININE: 1.16 mg/dL (ref 0.61–1.24)
Chloride: 111 mmol/L (ref 101–111)
GFR calc non Af Amer: >60 mL/min (ref >60)
GLUCOSE: 82 mg/dL (ref 65–99)
Potassium: 4 mmol/L (ref 3.5–5.1)
Sodium: 139 mmol/L (ref 135–145)

## 2016-03-29 LAB — CBC
HEMATOCRIT: 41.5 % (ref 39.0–52.0)
Hemoglobin: 14.4 g/dL (ref 13.0–17.0)
MCH: 29.4 pg (ref 26.0–34.0)
MCHC: 34.7 g/dL (ref 30.0–36.0)
MCV: 84.7 fL (ref 78.0–100.0)
Platelets: DECREASED 10*3/uL (ref 150–400)
RBC: 4.9 MIL/uL (ref 4.22–5.81)
RDW: 14.8 % (ref 11.5–15.5)
WBC: 7.8 10*3/uL (ref 4.0–10.5)

## 2016-03-29 LAB — GLUCOSE, CAPILLARY: Glucose-Capillary: 75 mg/dL (ref 65–99)

## 2016-03-29 MED ORDER — DILTIAZEM HCL 100 MG IV SOLR
5.0000 mg/h | INTRAVENOUS | Status: DC
  Administered 2016-03-29: 5 mg/h via INTRAVENOUS
  Administered 2016-03-29: 10 mg/h via INTRAVENOUS
  Filled 2016-03-29 (×3): qty 100

## 2016-03-29 NOTE — Progress Notes (Addendum)
PROGRESS NOTE                                                                                                                                                                                                             Patient Demographics:    Leonard Forbes, is a 60 y.o. male, DOB - 05/14/1955, KGM:[id number]  Admit date - 03/25/2016   Admitting Physician Bobette Mo, MD  Outpatient Primary MD for the patient is No PCP Per Patient  LOS - 4  Outpatient Specialists:None  Chief Complaint  Patient presents with  . Mouth Injury      Brief Narrative   60 year old male wit active alcohol and cocaine abuse, history of stroke and hypertension brought to the ED after having a syncopal episode at the urgent care on the day prior to admission. Patient was at the urgent care center for evaluation of right-sided hard palate lesion. He reportedly took a large amount of ibuprofen and use cocaine 3 days prior to admission.  Patient brought to the ED and was found to be encephalopathic concerning for alcohol intoxication.  Hospital course prolonged with alcohol withdrawal, fall with finding of small subacute subdural hematoma.   Subjective:    Patient somnolent and poorly arousable after receiving Ativan this morning. Tachycardic with multiple PVCs and NSVT on telemetry.   Assessment  & Plan :   Principal problem Acute metabolic encephalopathy Likely combination of alcohol intoxication and withdrawal, cocaine and seen and? subacute subdural hematoma. Patient is an active withdrawal with CIWA of 13-14. Monitor with when necessary Ativan. Added scheduled Librium. Continue IV fluids. Continue thiamine, folate and multivitamin Pt rather somnolent this AM but can awake, answers some questions.   Acute kidney injury Prerenal secondary to dehydration. Improved with fluids. Cr WNL this AM  Uncontrolled hypertension Secondary to  alcohol withdrawal. Continue lisinopril. Added when necessary hydralazine. SBP in 120's this AM.   PVCs and NSVT, A-fib overnight  Supplement Mg and K as needed.  K is WNL this AM. HR stable and in NSR this AM, pt was placed on Cardizem drip, taper down and plan to change to PO Not AC candidate, high risk fall, hematoma on CT head, pt also with substance abuse   Fall on 12/21 Patient fell from bed when attempting to get up during the night. A head CT  showed small subdural collection measuring 4 mm (subdural to chronic). Suggests this is likely present on admission head CT. Shows a trace 2-3 mm left-to-right shift. Monitor clinically for now. Avoid heparin products. Follow-up head CT in 48 hours.  If neurological changes noted will need early follow-up and neurosurgical consultation.  Hard palate lesion Seen by SLP and cleared for regular diet. Needs outpatient follow-up.  ? Sepsis Patient tachycardic, mild leukocytosis and elevated lactic acid. No clinical signs of infection. Received IV fluid bolus. UA unremarkable. CT chest negative for aspiration. No fevers overnight.   Prominent ascending thoracic aorta Measures 44 cm. Recommend annual imaging with CT angiogram or MRI.   Code Status : Full code  Family Communication  : None at bedside  Disposition Plan  : Pending hospital course  Barriers For Discharge : Active alcohol withdrawal/encephalopathy  Consults  :  None  Procedures  : Head CT x2  DVT Prophylaxis  :  SCD's  Lab Results  Component Value Date   PLT  03/29/2016    PLATELET CLUMPS NOTED ON SMEAR, COUNT APPEARS DECREASED    Antibiotics  :    Anti-infectives    None        Objective:   Vitals:   03/28/16 2333 03/29/16 0330 03/29/16 0353 03/29/16 0753  BP: (!) 156/89 (!) 151/127  (!) 121/94  Pulse: 62 (!) 128  85  Resp: 18 (!) 26    Temp: 97.7 F (36.5 C) 98.3 F (36.8 C)  97 F (36.1 C)  TempSrc: Oral Oral  Axillary  SpO2: 98% 98%    Weight:    57.3 kg (126 lb 5.2 oz)   Height:        Wt Readings from Last 3 Encounters:  03/29/16 57.3 kg (126 lb 5.2 oz)  07/07/14 65.1 kg (143 lb 9.6 oz)  06/22/14 65.3 kg (144 lb)     Intake/Output Summary (Last 24 hours) at 03/29/16 1002 Last data filed at 03/29/16 0600  Gross per 24 hour  Intake          2314.17 ml  Output             1250 ml  Net          1064.17 ml   Physical Exam  Gen: Somnolent, poorly arousable HEENT: Pupils reactive bilaterally, moist mucosa, supple neck Chest: clear b/l, no added sounds CVS: None S2 tachycardic, no murmurs rubs or gallop GI: soft, NT, ND, BS+ Musculoskeletal: warm, no edema CNS: Somnolent, poorly arousable, moves all extremities.   Data Review:    CBC  Recent Labs Lab 03/25/16 1639 03/26/16 0533 03/27/16 0306 03/28/16 0531 03/29/16 0148  WBC 15.2* 15.5* 13.0* 9.2 7.8  HGB 16.3 15.7 13.5 13.0 14.4  HCT 48.6 47.0 40.5 38.7* 41.5  PLT 355 326 334 349 PLATELET CLUMPS NOTED ON SMEAR, COUNT APPEARS DECREASED  MCV 89.5 88.8 86.7 87.6 84.7  MCH 30.0 29.7 28.9 29.4 29.4  MCHC 33.5 33.4 33.3 33.6 34.7  RDW 16.2* 15.8* 15.5 15.5 14.8  LYMPHSABS 1.4 2.3 1.7  --   --   MONOABS 0.8 1.2* 1.2*  --   --   EOSABS 0.0 0.0 0.3  --   --   BASOSABS 0.0 0.0 0.0  --   --     Chemistries   Recent Labs Lab 03/25/16 1639 03/25/16 2059 03/25/16 2104 03/26/16 0533 03/27/16 0306 03/28/16 0531 03/28/16 1032 03/29/16 0148  NA 143  --   --  140 140 140  --  139  K 5.5* 4.7  --  3.9 3.7 3.8  --  4.0  CL 108  --   --  107 111 110  --  111  CO2 23  --   --  25 20* 20*  --  19*  GLUCOSE 112*  --   --  98 85 89  --  82  BUN 12  --   --  10 11 9   --  10  CREATININE 1.47*  --   --  1.28* 1.18 1.09  --  1.16  CALCIUM 9.9  --   --  9.0 8.4* 9.0  --  8.7*  MG  --   --  1.9  --   --   --  1.7  --   AST 19  --   --  28 18  --   --   --   ALT 13*  --   --  14* 11*  --   --   --   ALKPHOS 95  --   --  95 68  --   --   --   BILITOT 1.0  --   --  1.2  1.4*  --   --   --    ------------------------------------------------------------------------------------------------------------------  Cardiac Enzymes  Recent Labs Lab 03/26/16 0533  TROPONINI <0.03   Inpatient Medications  Scheduled Meds: . chlordiazePOXIDE  25 mg Oral TID  . feeding supplement (ENSURE ENLIVE)  237 mL Oral BID BM  . lisinopril  20 mg Oral Daily  . potassium chloride  20 mEq Oral Once  . sodium chloride flush  3 mL Intravenous Q12H  . sodium chloride flush  3 mL Intravenous Q12H   Continuous Infusions: . sodium chloride 100 mL/hr at 03/29/16 0104  . diltiazem (CARDIZEM) infusion 10 mg/hr (03/29/16 0442)   PRN Meds:.diltiazem, hydrALAZINE, LORazepam, ondansetron **OR** ondansetron (ZOFRAN) IV  Micro Results Recent Results (from the past 240 hour(s))  MRSA PCR Screening     Status: None   Collection Time: 03/26/16  2:04 AM  Result Value Ref Range Status   MRSA by PCR NEGATIVE NEGATIVE Final    Comment:        The GeneXpert MRSA Assay (FDA approved for NASAL specimens only), is one component of a comprehensive MRSA colonization surveillance program. It is not intended to diagnose MRSA infection nor to guide or monitor treatment for MRSA infections.   Culture, blood (routine x 2)     Status: None (Preliminary result)   Collection Time: 03/26/16 10:20 AM  Result Value Ref Range Status   Specimen Description BLOOD BLOOD LEFT ARM  Final   Special Requests IN PEDIATRIC BOTTLE 2CC  Final   Culture NO GROWTH 2 DAYS  Final   Report Status PENDING  Incomplete  Culture, blood (routine x 2)     Status: None (Preliminary result)   Collection Time: 03/26/16  1:30 PM  Result Value Ref Range Status   Specimen Description BLOOD BLOOD LEFT ARM  Final   Special Requests IN PEDIATRIC BOTTLE 1.0 CC  Final   Culture NO GROWTH 2 DAYS  Final   Report Status PENDING  Incomplete    Radiology Reports Dg Chest 2 View  Result Date: 03/25/2016 CLINICAL DATA:   Syncope.  Ibuprofen overdose. EXAM: CHEST  2 VIEW COMPARISON:  11/06/2010. FINDINGS: Normal sized heart. Tortuous aorta. Clear lungs with normal vascularity. Right ventriculoperitoneal shunt tube. Unremarkable bones. IMPRESSION: No acute abnormality. Electronically Signed   By: Viviann SpareSteven  Azucena Kuba M.D.   On: 03/25/2016 16:28   Ct Head Wo Contrast  Result Date: 03/28/2016 CLINICAL DATA:  Initial evaluation for acute altered mental status, fall out of that. EXAM: CT HEAD WITHOUT CONTRAST TECHNIQUE: Contiguous axial images were obtained from the base of the skull through the vertex without intravenous contrast. COMPARISON:  Comparison made with prior CT from 03/25/2016. FINDINGS: Brain: Cerebral atrophy with chronic microvascular ischemic disease noted, stable. Right parietal approach VP shunt catheter remains in place with tip at the left basal ganglia, stable. Ventricular size is unchanged without evidence for hydrocephalus. Streak artifact from surgical clips from prior aneurysm repair again seen. No evidence for acute intracranial hemorrhage. No findings to suggest acute large vessel territory infarct. No mass lesion, midline shift, or mass effect. There is a left holo hemispheric subdural collection overlying the left cerebral hemisphere. This measures up to 4 mm in maximal thickness. Collection is largely I so to low-density as compared to the adjacent brain, compatible with a sub acute collection. No appreciable there is trace 3 mm left-to-right shift. Upon review of prior CT of from 03/25/2016, this collection appears to have been present on previous exam, although extremely difficult to visualize on that study, as this collection was only trace in size on that exam. Trace left-to-right shift is not significantly changed. No hyperdense component within this collection to suggest that this has acutely bled with recent fall. This collection is suspected to at least in part be related to the patient's shunt. No  other extra-axial fluid collection. Vascular: No hyperdense vessel, although evaluation limited by streak artifact from aneurysm clip. Skull: Scalp soft tissues demonstrate no acute abnormality. Calvarium unchanged. Postoperative changes from prior right pterional craniotomy again seen. Sinuses/Orbits: Globes and orbital soft tissues within normal limits. Scattered mucosal thickening within the ethmoidal air cells and right maxillary sinus. Paranasal sinuses are otherwise clear. No mastoid effusion. IMPRESSION: 1. Left holo hemispheric subdural collection measuring up to 4 mm as above, subacute to chronic in appearance. Although not mentioned previously, this collection was likely present on previous exam from 03/25/2016, although extremely difficult to visualize on that study as this was only trace in size at that time. The apparent interval increase in size suspected to be related to shunting. No internal hyperdensity to suggest that this has acutely bled with recent fall. 2. Trace 2-3 mm of left-to-right shift, relatively stable. 3. No other acute intracranial process. 4. 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 5. Right parietal approach VP shunt in stable position. No hydrocephalus. Electronically Signed   By: Rise Mu M.D.   On: 03/28/2016 01:59   Ct Head Wo Contrast  Result Date: 03/25/2016 CLINICAL DATA:  60 year old male with a history of altered mental status EXAM: CT HEAD WITHOUT CONTRAST TECHNIQUE: Contiguous axial images were obtained from the base of the skull through the vertex without intravenous contrast. COMPARISON:  07/31/2014, 11/06/2010 FINDINGS: Brain: No acute intracranial hemorrhage. No midline shift or mass effect. Unremarkable appearance of the ventricular system. 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 Skull: Surgical changes of right pterional  craniotomy for surgical clipping of aneurysm. No acute bony abnormality. Right posterior parietal approach ventriculostomy terminates to the left of midline. Sinuses/Orbits: No significant sinus disease. Other: None IMPRESSION: No CT evidence of acute intracranial abnormality. 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 Signed, Yvone Neu. Loreta Ave, DO [facility] Electronically Signed   By: Gilmer Mor D.O.   On: 03/25/2016 20:30   Ct Chest Wo Contrast  Result Date: 03/26/2016 CLINICAL DATA:  Shortness of breath. Concern for aspiration pneumonitis EXAM: CT CHEST WITHOUT CONTRAST TECHNIQUE: Multidetector CT imaging of the chest was performed following the standard protocol without IV contrast. COMPARISON:  Chest radiograph March 25, 2016 FINDINGS: Cardiovascular: The ascending thoracic aortic diameter is measured at 4.0 x 4.0 cm, measured on axial slice 59 series 201. The thoracic aorta is somewhat ectatic. There are scattered foci of atherosclerotic calcification in the aorta. The visualized great vessels appear unremarkable on this noncontrast enhanced study. There is slight pericardial thickening without pericardial effusion evident. Mediastinum/Nodes: Thyroid appears unremarkable. There is no evident thoracic adenopathy. Lungs/Pleura: There is mild scarring in the lung apices with slight bullous disease in the extreme apices bilaterally. There are scattered small bullae throughout the lungs bilaterally. There is bibasilar atelectatic change, slightly more on the right than on the left. There is no frank edema or consolidation. There is no pleural effusion or pleural thickening evident. Upper Abdomen: Visualized upper abdominal structures appear unremarkable. Musculoskeletal: There are no blastic  or lytic bone lesions. IMPRESSION: Bibasilar atelectatic change, slightly more on the right than on the left, likely due to dependent positioning. No edema or consolidation. No findings felt to represent aspiration pneumonitis. No adenopathy. Prominence of the ascending thoracic aorta with a measured diameter 4.0 x 4.0 cm. Recommend annual imaging followup by CTA or MRA. This recommendation follows 2010 ACCF/AHA/AATS/ACR/ASA/SCA/SCAI/SIR/STS/SVM Guidelines for the Diagnosis and Management of Patients with Thoracic Aortic Disease. Circulation. 2010; 121: W119-J478. Thoracic aorta somewhat ectatic with scattered foci of atherosclerotic calcification. Borderline pericardial thickening without pericardial effusion. Electronically Signed   By: Bretta Bang III M.D.   On: 03/26/2016 10:48    Time Spent in minutes  35  Debbora Presto M.D on 03/29/2016 at 10:02 AM  Between 7am to 7pm - Pager - [phone]  After 7pm go to [email or web address] - password [facility]  [facility] -  Office  [phone]

## 2016-03-29 NOTE — Plan of Care (Signed)
Problem: Education: Goal: Knowledge of [facility] Education information/materials will improve Outcome: Progressing Discussed plan of care with patient and family with some teach back displayed

## 2016-03-29 NOTE — Plan of Care (Signed)
Patient sustaining HR 110-130 even after Cardizem bolus. Patient does not appear to be in distress or to be having pain although he cannot answer questions as to his status -- appears comfortable. Starting Cardizem gtt. BP stable. Will continue to monitor.

## 2016-03-29 NOTE — Progress Notes (Signed)
Pt beginning to wake up more. Pt able to answer most questions. Upon giving water noticed that pt was having a hard time using straw (getting liquid to come to top of straw) and was unable to swallow medication well. Paged MD to ask for SLP eval. Awaiting answer, will continue to monitor.

## 2016-03-30 LAB — COMPREHENSIVE METABOLIC PANEL
ALBUMIN: 3 g/dL — AB (ref 3.5–5.0)
ALK PHOS: 61 U/L (ref 38–126)
ALT: 11 U/L — AB (ref 17–63)
ANION GAP: 9 (ref 5–15)
AST: 14 U/L — ABNORMAL LOW (ref 15–41)
BILIRUBIN TOTAL: 0.9 mg/dL (ref 0.3–1.2)
BUN: 9 mg/dL (ref 6–20)
CALCIUM: 8.8 mg/dL — AB (ref 8.9–10.3)
CO2: 20 mmol/L — AB (ref 22–32)
CREATININE: 1 mg/dL (ref 0.61–1.24)
Chloride: 111 mmol/L (ref 101–111)
GFR calc non Af Amer: >60 mL/min (ref >60)
GLUCOSE: 89 mg/dL (ref 65–99)
Potassium: 3.4 mmol/L — ABNORMAL LOW (ref 3.5–5.1)
Sodium: 140 mmol/L (ref 135–145)
TOTAL PROTEIN: 6.4 g/dL — AB (ref 6.5–8.1)

## 2016-03-30 LAB — CBC
HCT: 39.5 % (ref 39.0–52.0)
HEMOGLOBIN: 13.7 g/dL (ref 13.0–17.0)
MCH: 29.7 pg (ref 26.0–34.0)
MCHC: 34.7 g/dL (ref 30.0–36.0)
MCV: 85.5 fL (ref 78.0–100.0)
PLATELETS: 368 10*3/uL (ref 150–400)
RBC: 4.62 MIL/uL (ref 4.22–5.81)
RDW: 15 % (ref 11.5–15.5)
WBC: 7.5 10*3/uL (ref 4.0–10.5)

## 2016-03-30 LAB — PROCALCITONIN: Procalcitonin: <0.1 ng/mL

## 2016-03-30 LAB — MAGNESIUM: MAGNESIUM: 1.6 mg/dL — AB (ref 1.7–2.4)

## 2016-03-30 LAB — GLUCOSE, CAPILLARY
GLUCOSE-CAPILLARY: 91 mg/dL (ref 65–99)
Glucose-Capillary: 118 mg/dL — ABNORMAL HIGH (ref 65–99)

## 2016-03-30 MED ORDER — POTASSIUM CHLORIDE IN NACL 40-0.9 MEQ/L-% IV SOLN
INTRAVENOUS | Status: DC
  Administered 2016-03-30 – 2016-03-31 (×2): 75 mL/h via INTRAVENOUS
  Filled 2016-03-30 (×2): qty 1000

## 2016-03-30 MED ORDER — DILTIAZEM HCL ER COATED BEADS 120 MG PO CP24
120.0000 mg | ORAL_CAPSULE | Freq: Every day | ORAL | Status: DC
  Administered 2016-03-30 – 2016-04-04 (×6): 120 mg via ORAL
  Filled 2016-03-30 (×6): qty 1

## 2016-03-30 MED ORDER — MAGNESIUM SULFATE 2 GM/50ML IV SOLN
2.0000 g | Freq: Once | INTRAVENOUS | Status: AC
  Administered 2016-03-30: 2 g via INTRAVENOUS
  Filled 2016-03-30: qty 50

## 2016-03-30 MED ORDER — MAGNESIUM SULFATE 2 GM/50ML IV SOLN: 2.0000 g | Freq: Once | INTRAVENOUS | Status: DC

## 2016-03-30 NOTE — Progress Notes (Signed)
PROGRESS NOTE                                                                                                                                                                                                             Patient Demographics:    Leonard Forbes, is a 60 y.o. male, DOB - 02/01/1956, ZOX:[id number]  Admit date - 03/25/2016   Admitting Physician Bobette Mo, MD  Outpatient Primary MD for the patient is No PCP Per Patient  LOS - 5  Outpatient Specialists:None  Chief Complaint  Patient presents with  . Mouth Injury      Brief Narrative   60 year old male wit active alcohol and cocaine abuse, history of stroke and hypertension brought to the ED after having a syncopal episode at the urgent care on the day prior to admission. Patient was at the urgent care center for evaluation of right-sided hard palate lesion. He reportedly took a large amount of ibuprofen and use cocaine 3 days prior to admission.  Patient brought to the ED and was found to be encephalopathic concerning for alcohol intoxication.  Hospital course prolonged with alcohol withdrawal, fall with finding of small subacute subdural hematoma.   Subjective:    Patient somnolent and poorly arousable after receiving Ativan this morning. Tachycardic with multiple PVCs and NSVT on telemetry.   Assessment  & Plan :   Principal problem Acute metabolic encephalopathy Likely combination of alcohol intoxication and withdrawal, cocaine and seen and? subacute subdural hematoma. Patient is an active withdrawal with CIWA of 8-10 Monitor with when necessary Ativan. Added scheduled Librium. Continue IV fluids. Continue thiamine, folate and multivitamin Pt rather somnolent this AM but can awake, answers some questions.  OK to transfer to tele today   Acute kidney injury Prerenal secondary to dehydration. Improved with fluids. Cr WNL this AM BMP in  AM  Hypokalemia and Hypomagnesemia - both electrolytes low - supplement and repeat BMP and Mg in AM  Uncontrolled hypertension Secondary to alcohol withdrawal. Continue lisinopril, when necessary hydralazine. SBP in 150's this AM.   PVCs and NSVT, A-fib overnight  Supplement Mg and K as needed.  K is WNL this AM. HR stable and in NSR this AM, pt was placed on Cardizem drip, change to PO Cardizem  Not AC candidate, high risk fall, hematoma on CT head, pt also  with substance abuse   Fall on 12/21 Patient fell from bed when attempting to get up during the night. A head CT showed small subdural collection measuring 4 mm (subdural to chronic). Suggests this is likely present on admission head CT. Shows a trace 2-3 mm left-to-right shift. Monitor clinically for now. Avoid heparin products. Follow-up head CT in 48 hours.  If neurological changes noted will need early follow-up and neurosurgical consultation.  Hard palate lesion Seen by SLP and cleared for regular diet. More difficulty again 12/23, SLP re evaluation requested  Will see with IR if we can biopsy.   ? Sepsis Patient tachycardic, mild leukocytosis and elevated lactic acid. No clinical signs of infection. Received IV fluid bolus. UA unremarkable. CT chest negative for aspiration. No fevers overnight.   Prominent ascending thoracic aorta Measures 44 cm. Recommend annual imaging with CT angiogram or MRI.  Code Status : Full code  Family Communication  : None at bedside  Disposition Plan  : Pending hospital course  Barriers For Discharge : Active alcohol withdrawal/encephalopathy  Consults  :  None  Procedures  : Head CT x2  DVT Prophylaxis  :  SCD's  Lab Results  Component Value Date   PLT 368 03/30/2016    Antibiotics  :    Anti-infectives    None        Objective:   Vitals:   03/30/16 0700 03/30/16 0834 03/30/16 1002 03/30/16 1003  BP: (!) 146/105 (!) 159/77 (!) 158/109 (!) 158/109  Pulse: 95      Resp: (!) 24     Temp:  98.6 F (37 C)    TempSrc:  Axillary    SpO2: 96%     Weight:      Height:        Wt Readings from Last 3 Encounters:  03/30/16 60.2 kg (132 lb 11.5 oz)  07/07/14 65.1 kg (143 lb 9.6 oz)  06/22/14 65.3 kg (144 lb)     Intake/Output Summary (Last 24 hours) at 03/30/16 1128 Last data filed at 03/30/16 1003  Gross per 24 hour  Intake          2567.17 ml  Output             1300 ml  Net          1267.17 ml   Physical Exam  Gen: Somnolent, can awake and answers some questions, able to follow some commands  HEENT: Pupils reactive bilaterally, moist mucosa, supple neck Chest: clear b/l, no added sounds CVS: None S2 tachycardic, no murmurs rubs or gallop GI: soft, NT, ND, BS+ Musculoskeletal: warm, no edema CNS: moves all extremities.   Data Review:    CBC  Recent Labs Lab 03/25/16 1639 03/26/16 0533 03/27/16 0306 03/28/16 0531 03/29/16 0148 03/30/16 0232  WBC 15.2* 15.5* 13.0* 9.2 7.8 7.5  HGB 16.3 15.7 13.5 13.0 14.4 13.7  HCT 48.6 47.0 40.5 38.7* 41.5 39.5  PLT 355 326 334 349 PLATELET CLUMPS NOTED ON SMEAR, COUNT APPEARS DECREASED 368  MCV 89.5 88.8 86.7 87.6 84.7 85.5  MCH 30.0 29.7 28.9 29.4 29.4 29.7  MCHC 33.5 33.4 33.3 33.6 34.7 34.7  RDW 16.2* 15.8* 15.5 15.5 14.8 15.0  LYMPHSABS 1.4 2.3 1.7  --   --   --   MONOABS 0.8 1.2* 1.2*  --   --   --   EOSABS 0.0 0.0 0.3  --   --   --   BASOSABS 0.0 0.0 0.0  --   --   --  Chemistries   Recent Labs Lab 03/25/16 1639  03/25/16 2104 03/26/16 0533 03/27/16 0306 03/28/16 0531 03/28/16 1032 03/29/16 0148 03/30/16 0232  NA 143  --   --  140 140 140  --  139 140  K 5.5*  < >  --  3.9 3.7 3.8  --  4.0 3.4*  CL 108  --   --  107 111 110  --  111 111  CO2 23  --   --  25 20* 20*  --  19* 20*  GLUCOSE 112*  --   --  98 85 89  --  82 89  BUN 12  --   --  10 11 9   --  10 9  CREATININE 1.47*  --   --  1.28* 1.18 1.09  --  1.16 1.00  CALCIUM 9.9  --   --  9.0 8.4* 9.0  --  8.7*  8.8*  MG  --   --  1.9  --   --   --  1.7  --  1.6*  AST 19  --   --  28 18  --   --   --  14*  ALT 13*  --   --  14* 11*  --   --   --  11*  ALKPHOS 95  --   --  95 68  --   --   --  61  BILITOT 1.0  --   --  1.2 1.4*  --   --   --  0.9  < > = values in this interval not displayed. ------------------------------------------------------------------------------------------------------------------  Cardiac Enzymes  Recent Labs Lab 03/26/16 0533  TROPONINI <0.03   Inpatient Medications  Scheduled Meds: . chlordiazePOXIDE  25 mg Oral TID  . diltiazem  120 mg Oral Daily  . feeding supplement (ENSURE ENLIVE)  237 mL Oral BID BM  . lisinopril  20 mg Oral Daily  . potassium chloride  20 mEq Oral Once  . sodium chloride flush  3 mL Intravenous Q12H   Continuous Infusions: . sodium chloride 75 mL/hr at 03/30/16 0017   PRN Meds:.diltiazem, hydrALAZINE, LORazepam, ondansetron **OR** ondansetron (ZOFRAN) IV  Micro Results Recent Results (from the past 240 hour(s))  MRSA PCR Screening     Status: None   Collection Time: 03/26/16  2:04 AM  Result Value Ref Range Status   MRSA by PCR NEGATIVE NEGATIVE Final    Comment:        The GeneXpert MRSA Assay (FDA approved for NASAL specimens only), is one component of a comprehensive MRSA colonization surveillance program. It is not intended to diagnose MRSA infection nor to guide or monitor treatment for MRSA infections.   Culture, blood (routine x 2)     Status: None (Preliminary result)   Collection Time: 03/26/16 10:20 AM  Result Value Ref Range Status   Specimen Description BLOOD BLOOD LEFT ARM  Final   Special Requests IN PEDIATRIC BOTTLE 2CC  Final   Culture NO GROWTH 4 DAYS  Final   Report Status PENDING  Incomplete  Culture, blood (routine x 2)     Status: None (Preliminary result)   Collection Time: 03/26/16  1:30 PM  Result Value Ref Range Status   Specimen Description BLOOD BLOOD LEFT ARM  Final   Special Requests IN  PEDIATRIC BOTTLE 1.0 CC  Final   Culture NO GROWTH 4 DAYS  Final   Report Status PENDING  Incomplete    Radiology Reports Dg  Chest 2 View  Result Date: 03/25/2016 CLINICAL DATA:  Syncope.  Ibuprofen overdose. EXAM: CHEST  2 VIEW COMPARISON:  11/06/2010. FINDINGS: Normal sized heart. Tortuous aorta. Clear lungs with normal vascularity. Right ventriculoperitoneal shunt tube. Unremarkable bones. IMPRESSION: No acute abnormality. Electronically Signed   By: Beckie SaltsSteven  Reid M.D.   On: 03/25/2016 16:28   Ct Head Wo Contrast  Result Date: 03/28/2016 CLINICAL DATA:  Initial evaluation for acute altered mental status, fall out of that. EXAM: CT HEAD WITHOUT CONTRAST TECHNIQUE: Contiguous axial images were obtained from the base of the skull through the vertex without intravenous contrast. COMPARISON:  Comparison made with prior CT from 03/25/2016. FINDINGS: Brain: Cerebral atrophy with chronic microvascular ischemic disease noted, stable. Right parietal approach VP shunt catheter remains in place with tip at the left basal ganglia, stable. Ventricular size is unchanged without evidence for hydrocephalus. Streak artifact from surgical clips from prior aneurysm repair again seen. No evidence for acute intracranial hemorrhage. No findings to suggest acute large vessel territory infarct. No mass lesion, midline shift, or mass effect. There is a left holo hemispheric subdural collection overlying the left cerebral hemisphere. This measures up to 4 mm in maximal thickness. Collection is largely I so to low-density as compared to the adjacent brain, compatible with a sub acute collection. No appreciable there is trace 3 mm left-to-right shift. Upon review of prior CT of from 03/25/2016, this collection appears to have been present on previous exam, although extremely difficult to visualize on that study, as this collection was only trace in size on that exam. Trace left-to-right shift is not significantly changed. No  hyperdense component within this collection to suggest that this has acutely bled with recent fall. This collection is suspected to at least in part be related to the patient's shunt. No other extra-axial fluid collection. Vascular: No hyperdense vessel, although evaluation limited by streak artifact from aneurysm clip. Skull: Scalp soft tissues demonstrate no acute abnormality. Calvarium unchanged. Postoperative changes from prior right pterional craniotomy again seen. Sinuses/Orbits: Globes and orbital soft tissues within normal limits. Scattered mucosal thickening within the ethmoidal air cells and right maxillary sinus. Paranasal sinuses are otherwise clear. No mastoid effusion. IMPRESSION: 1. Left holo hemispheric subdural collection measuring up to 4 mm as above, subacute to chronic in appearance. Although not mentioned previously, this collection was likely present on previous exam from 03/25/2016, although extremely difficult to visualize on that study as this was only trace in size at that time. The apparent interval increase in size suspected to be related to shunting. No internal hyperdensity to suggest that this has acutely bled with recent fall. 2. Trace 2-3 mm of left-to-right shift, relatively stable. 3. No other acute intracranial process. 4. 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 5. Right parietal approach VP shunt in stable position. No hydrocephalus. Electronically Signed   By: Rise MuBenjamin  McClintock M.D.   On: 03/28/2016 01:59   Ct Head Wo Contrast  Result Date: 03/25/2016 CLINICAL DATA:  60 year old male with a history of altered mental status EXAM: CT HEAD WITHOUT CONTRAST TECHNIQUE: Contiguous axial images were obtained from the base of the skull through the vertex without intravenous contrast. COMPARISON:  07/31/2014, 11/06/2010 FINDINGS: Brain: No acute intracranial hemorrhage. No midline shift or mass  effect. Unremarkable appearance of the ventricular system. 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 Skull: Surgical changes of right pterional craniotomy for surgical clipping of aneurysm. No  acute bony abnormality. Right posterior parietal approach ventriculostomy terminates to the left of midline. Sinuses/Orbits: No significant sinus disease. Other: None IMPRESSION: No CT evidence of acute intracranial abnormality. 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 Signed, Yvone Neu. Loreta Ave, DO [facility] Electronically Signed   By: Gilmer Mor D.O.   On: 03/25/2016 20:30   Ct Chest Wo Contrast  Result Date: 03/26/2016 CLINICAL DATA:  Shortness of breath. Concern for aspiration pneumonitis EXAM: CT CHEST WITHOUT CONTRAST TECHNIQUE: Multidetector CT imaging of the chest was performed following the standard protocol without IV contrast. COMPARISON:  Chest radiograph March 25, 2016 FINDINGS: Cardiovascular: The ascending thoracic aortic diameter is measured at 4.0 x 4.0 cm, measured on axial slice 59 series 201. The thoracic aorta is somewhat ectatic. There are scattered foci of atherosclerotic calcification in the aorta. The visualized great vessels appear unremarkable on this noncontrast enhanced study. There is slight pericardial thickening without pericardial effusion evident. Mediastinum/Nodes: Thyroid appears unremarkable. There is no evident thoracic adenopathy. Lungs/Pleura: There is mild scarring in the lung apices with slight bullous disease in the extreme apices bilaterally. There are scattered small bullae throughout the lungs bilaterally. There is bibasilar atelectatic change, slightly more on the right than on the left. There is no frank edema or  consolidation. There is no pleural effusion or pleural thickening evident. Upper Abdomen: Visualized upper abdominal structures appear unremarkable. Musculoskeletal: There are no blastic or lytic bone lesions. IMPRESSION: Bibasilar atelectatic change, slightly more on the right than on the left, likely due to dependent positioning. No edema or consolidation. No findings felt to represent aspiration pneumonitis. No adenopathy. Prominence of the ascending thoracic aorta with a measured diameter 4.0 x 4.0 cm. Recommend annual imaging followup by CTA or MRA. This recommendation follows 2010 ACCF/AHA/AATS/ACR/ASA/SCA/SCAI/SIR/STS/SVM Guidelines for the Diagnosis and Management of Patients with Thoracic Aortic Disease. Circulation. 2010; 121: Z610-R604. Thoracic aorta somewhat ectatic with scattered foci of atherosclerotic calcification. Borderline pericardial thickening without pericardial effusion. Electronically Signed   By: Bretta Bang III M.D.   On: 03/26/2016 10:48    Time Spent in minutes  35  MAGICK-Nora Sabey M.D on 03/30/2016 at 11:28 AM  Between 7am to 7pm - Pager - [phone]  After 7pm go to [email or web address] - password [facility]  [facility] -  Office  [phone]

## 2016-03-30 NOTE — Progress Notes (Signed)
Admission note:  Arrival Method: Patient arrived in bed from 4E accompanied by the staff. Mental Orientation:  Alert to self, place and little bit of situation. Telemetry: 6E-15, CCMD notified. Assessment: See flow doc sheets. Skin: Dry and intact, no open areas or any rashes noted. IV: Right upper arm, NS 40Kcl 75 ml/hr Pain: Denies any pain and is currently sleeping. Tubes: Condom cath draining yellow clear urine. Safety Measures: Bed in low position, call bell and phone within reach, bed alarm activated. Fall Prevention Safety Plan: pt not ready and was sleepy. Admission Screening: Completed 6700 Orientation: Patient has been oriented to the unit, staff and to the room.

## 2016-03-31 DIAGNOSIS — Z823 Family history of stroke: Secondary | ICD-10-CM

## 2016-03-31 DIAGNOSIS — Z9889 Other specified postprocedural states: Secondary | ICD-10-CM

## 2016-03-31 DIAGNOSIS — F17210 Nicotine dependence, cigarettes, uncomplicated: Secondary | ICD-10-CM

## 2016-03-31 DIAGNOSIS — Z818 Family history of other mental and behavioral disorders: Secondary | ICD-10-CM

## 2016-03-31 DIAGNOSIS — Z79899 Other long term (current) drug therapy: Secondary | ICD-10-CM

## 2016-03-31 DIAGNOSIS — Z888 Allergy status to other drugs, medicaments and biological substances status: Secondary | ICD-10-CM

## 2016-03-31 DIAGNOSIS — K1379 Other lesions of oral mucosa: Secondary | ICD-10-CM

## 2016-03-31 LAB — BASIC METABOLIC PANEL
ANION GAP: 5 (ref 5–15)
BUN: 9 mg/dL (ref 6–20)
CO2: 26 mmol/L (ref 22–32)
Calcium: 8.7 mg/dL — ABNORMAL LOW (ref 8.9–10.3)
Chloride: 109 mmol/L (ref 101–111)
Creatinine, Ser: 1.16 mg/dL (ref 0.61–1.24)
GFR calc Af Amer: >60 mL/min (ref >60)
GLUCOSE: 105 mg/dL — AB (ref 65–99)
Potassium: 3.8 mmol/L (ref 3.5–5.1)
Sodium: 140 mmol/L (ref 135–145)

## 2016-03-31 LAB — CULTURE, BLOOD (ROUTINE X 2)
CULTURE: NO GROWTH
Culture: NO GROWTH

## 2016-03-31 LAB — CBC
HEMATOCRIT: 40 % (ref 39.0–52.0)
HEMOGLOBIN: 13.4 g/dL (ref 13.0–17.0)
MCH: 29 pg (ref 26.0–34.0)
MCHC: 33.5 g/dL (ref 30.0–36.0)
MCV: 86.6 fL (ref 78.0–100.0)
PLATELETS: 394 10*3/uL (ref 150–400)
RBC: 4.62 MIL/uL (ref 4.22–5.81)
RDW: 15.1 % (ref 11.5–15.5)
WBC: 7.8 10*3/uL (ref 4.0–10.5)

## 2016-03-31 LAB — MAGNESIUM: Magnesium: 1.8 mg/dL (ref 1.7–2.4)

## 2016-03-31 MED ORDER — HALOPERIDOL LACTATE 5 MG/ML IJ SOLN
2.0000 mg | Freq: Once | INTRAMUSCULAR | Status: AC
  Administered 2016-03-31: 2 mg via INTRAVENOUS

## 2016-03-31 MED ORDER — HALOPERIDOL LACTATE 5 MG/ML IJ SOLN: 2.0000 mg | Freq: Once | INTRAMUSCULAR | Status: DC

## 2016-03-31 MED ORDER — LORAZEPAM 2 MG/ML IJ SOLN
1.0000 mg | INTRAMUSCULAR | Status: DC | PRN
  Administered 2016-04-01 – 2016-04-02 (×3): 2 mg via INTRAVENOUS
  Administered 2016-04-03: 1 mg via INTRAVENOUS
  Administered 2016-04-03: 2 mg via INTRAVENOUS
  Filled 2016-03-31 (×5): qty 1

## 2016-03-31 MED ORDER — HALOPERIDOL LACTATE 5 MG/ML IJ SOLN
INTRAMUSCULAR | Status: AC
  Filled 2016-03-31: qty 1

## 2016-03-31 NOTE — Progress Notes (Signed)
Physical Therapy Treatment Patient Details Name: Leonard HartshornRonald Bothun MRN: [id number] DOB: 10-Aug-1955 Today's Date: 03/31/2016    History of Present Illness Pt adm with syncope. Pt with ?sepsis and now with ETOH withdrawal. Pt with fall on 12/21 with CT showing small subacute SDH present on admission. PMH - polysubstance abuse. aneurysm, craniotomy, vp shunt, HTN, CVA    PT Comments    Patient continues to present with dependencies in gait and mobility.  Patient with decreased balance requiring assistance for all mobility.  Patient lost balance several times during gait and is at high risk for falls.  Recommend continued PT to address gait and mobility.  Anticipate patient may need short-term SNF to reach independence.    Follow Up Recommendations  SNF     Equipment Recommendations  Other (comment) (to be determined)    Recommendations for Other Services       Precautions / Restrictions Precautions Precautions: Fall Precaution Comments: etoh withdrawal    Mobility  Bed Mobility Overal bed mobility: Needs Assistance Bed Mobility: Supine to Sit;Sit to Supine     Supine to sit: Supervision (increased time) Sit to supine: Min assist   General bed mobility comments: assist to place legs on bed when returning to supine; unable to adjust self in bed  Transfers Overall transfer level: Needs assistance Equipment used: 2 person hand held assist Transfers: Sit to/from Stand Sit to Stand: Min assist         General transfer comment: allowed patient to attempt transfer on his own; unable to reach standing with 5-6 attempts; assisted pt to power up from sitting and for balance  Ambulation/Gait Ambulation/Gait assistance: Mod assist Ambulation Distance (Feet): 150 Feet Assistive device: 1 person hand held assist Gait Pattern/deviations: Step-through pattern;Staggering left;Staggering right;Narrow base of support   Gait velocity interpretation: <1.8 ft/sec, indicative of risk for  recurrent falls General Gait Details: patient requiring assistance for balance; patient with several loss of balance episodes during gait; patient ambulated at extremely slow pace; attempted ambulation with RW - patient still very unsteady and RW seemed to hinder mobility instead of help.    Stairs            Wheelchair Mobility    Modified Rankin (Stroke Patients Only)       Balance Overall balance assessment: Needs assistance Sitting-balance support: No upper extremity supported;Feet supported Sitting balance-Leahy Scale: Fair Sitting balance - Comments: unable to take challenge to balance in sitting   Standing balance support: Single extremity supported Standing balance-Leahy Scale: Poor Standing balance comment: unable to maintain balance in standing without support                    Cognition Arousal/Alertness: Lethargic Behavior During Therapy: Flat affect Overall Cognitive Status: Impaired/Different from baseline         Following Commands: Follows one step commands inconsistently Safety/Judgement: Decreased awareness of safety;Decreased awareness of deficits   Problem Solving: Slow processing;Decreased initiation;Difficulty sequencing      Exercises      General Comments        Pertinent Vitals/Pain Pain Assessment: No/denies pain    Home Living                      Prior Function            PT Goals (current goals can now be found in the care plan section) Progress towards PT goals: Progressing toward goals    Frequency    Min  3X/week      PT Plan Current plan remains appropriate    Co-evaluation             End of Session Equipment Utilized During Treatment: Gait belt Activity Tolerance: Patient tolerated treatment well Patient left: in bed;with call bell/phone within reach;with bed alarm set     Time: [id number] PT Time Calculation (min) (ACUTE ONLY): 16 min  Charges:  $Gait Training: 8-22 mins                     G Codes:      Olivia CanterMoton, Trenese Haft M 03/31/2016, 3:19 PM 03/31/2016 Corlis HoveMargie Beata Beason, PT [phone]
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
## 2016-03-31 NOTE — Progress Notes (Addendum)
PROGRESS NOTE                                                                                                                                                                                                             Patient Demographics:    Leonard Forbes, is a 60 y.o. male, DOB - 1955/05/13, ZOX:[id number]  Admit date - 03/25/2016   Admitting Physician Bobette Mo, MD  Outpatient Primary MD for the patient is No PCP Per Patient  LOS - 6   Chief Complaint  Patient presents with  . Mouth Injury      Brief Narrative   60 year old male wit active alcohol and cocaine abuse, history of stroke and hypertension brought to the ED after having a syncopal episode at the urgent care on the day prior to admission. Patient was at the urgent care center for evaluation of right-sided hard palate lesion. He reportedly took a large amount of ibuprofen and use cocaine 3 days prior to admission.  Patient brought to the ED and was found to be encephalopathic concerning for alcohol intoxication.  Hospital course prolonged with alcohol withdrawal, fall with finding of small subacute subdural hematoma.   Subjective:    Patient somnolent and poorly arousable after receiving Ativan this morning. Tachycardic with multiple PVCs and NSVT on telemetry.   Assessment  & Plan :   Principal problem Acute metabolic encephalopathy - Likely combination of alcohol intoxication and withdrawal, cocaine and seen and? subacute subdural hematoma. - Patient is more alert this AM, able to answer questions, wants to go home - OK to stop IVF, ask PT to re evaluate, if PT says OK with ambulation, can consider d/c today or in AM.  - Psych will see once we have rec's of PT evaluation  Acute kidney injury - Prerenal secondary to dehydration. Improved with fluids. - Cr WNL this AM - Stop IVF  Hypokalemia and Hypomagnesemia - both electrolytes  supplemented and WNL this AM - supplement and repeat BMP and Mg in AM  Uncontrolled hypertension - Secondary to alcohol withdrawal. Continue lisinopril - SBP in 130's this AM   PVCs and NSVT, A-fib overnight  - Supplement Mg and K as needed.  - K is WNL this AM. - HR stable and in NSR this AM, continue PO Cardizem  - Not AC candidate, high risk  fall, hematoma on CT head, pt also with substance abuse   Fall on 12/21 - Patient fell from bed when attempting to get up during the night. A head CT showed small subdural collection measuring 4 mm (subdural to chronic). Suggests this is likely present on admission head CT. Shows a trace 2-3 mm left-to-right shift. - PT eval requested   Hard palate lesion - Seen by SLP and cleared for regular diet. - More difficulty again 12/23, SLP re evaluation requested   ? Sepsis on admission  - Patient tachycardic, mild leukocytosis and elevated lactic acid. No clinical signs of infection. Received IV fluid bolus. - UA unremarkable. CT chest negative for aspiration. - No fevers overnight. Sepsis ruled out   Prominent ascending thoracic aorta - Measures 44 cm. Recommend annual imaging with CT angiogram or MRI.  Code Status : Full code  Family Communication  : None at bedside, spoke with brother over the phone, says pt lives in group home  Disposition Plan  : Pending hospital course  Barriers For Discharge : Active alcohol withdrawal/encephalopathy  Consults  :  None  Procedures  : Head CT x2  DVT Prophylaxis  :  SCD's  Lab Results  Component Value Date   PLT 394 03/31/2016    Antibiotics  :    Anti-infectives    None        Objective:   Vitals:   03/30/16 2009 03/31/16 0007 03/31/16 0440 03/31/16 0956  BP: (!) 140/95 (!) 154/92 (!) 135/92 (!) 139/96  Pulse: 91 82 86 89  Resp: 18 18 18 18   Temp: 98.5 F (36.9 C)  97.5 F (36.4 C) 98.8 F (37.1 C)  TempSrc: Oral  Oral Oral  SpO2: 99% 97% 97% 97%  Weight: 57.4 kg (126 lb 8.7  oz)     Height:        Wt Readings from Last 3 Encounters:  03/30/16 57.4 kg (126 lb 8.7 oz)  07/07/14 65.1 kg (143 lb 9.6 oz)  06/22/14 65.3 kg (144 lb)     Intake/Output Summary (Last 24 hours) at 03/31/16 1323 Last data filed at 03/31/16 0930  Gross per 24 hour  Intake             2595 ml  Output              990 ml  Net             1605 ml   Physical Exam  Gen: more alert, answers some questions, able to follow commands  HEENT: Pupils reactive bilaterally, moist mucosa, supple neck Chest: clear b/l, no added sounds CVS: None S2 tachycardic, no murmurs rubs or gallop GI: soft, NT, ND, BS+ Musculoskeletal: warm, no edema CNS: moves all extremities.   Data Review:    CBC  Recent Labs Lab 03/25/16 1639 03/26/16 0533 03/27/16 0306 03/28/16 0531 03/29/16 0148 03/30/16 0232 03/31/16 0523  WBC 15.2* 15.5* 13.0* 9.2 7.8 7.5 7.8  HGB 16.3 15.7 13.5 13.0 14.4 13.7 13.4  HCT 48.6 47.0 40.5 38.7* 41.5 39.5 40.0  PLT 355 326 334 349 PLATELET CLUMPS NOTED ON SMEAR, COUNT APPEARS DECREASED 368 394  MCV 89.5 88.8 86.7 87.6 84.7 85.5 86.6  MCH 30.0 29.7 28.9 29.4 29.4 29.7 29.0  MCHC 33.5 33.4 33.3 33.6 34.7 34.7 33.5  RDW 16.2* 15.8* 15.5 15.5 14.8 15.0 15.1  LYMPHSABS 1.4 2.3 1.7  --   --   --   --   MONOABS 0.8 1.2* 1.2*  --   --   --   --  EOSABS 0.0 0.0 0.3  --   --   --   --   BASOSABS 0.0 0.0 0.0  --   --   --   --     Chemistries   Recent Labs Lab 03/25/16 1639  03/25/16 2104 03/26/16 0533 03/27/16 0306 03/28/16 0531 03/28/16 1032 03/29/16 0148 03/30/16 0232 03/31/16 0523  NA 143  --   --  140 140 140  --  139 140 140  K 5.5*  < >  --  3.9 3.7 3.8  --  4.0 3.4* 3.8  CL 108  --   --  107 111 110  --  111 111 109  CO2 23  --   --  25 20* 20*  --  19* 20* 26  GLUCOSE 112*  --   --  98 85 89  --  82 89 105*  BUN 12  --   --  10 11 9   --  10 9 9   CREATININE 1.47*  --   --  1.28* 1.18 1.09  --  1.16 1.00 1.16  CALCIUM 9.9  --   --  9.0 8.4* 9.0  --   8.7* 8.8* 8.7*  MG  --   --  1.9  --   --   --  1.7  --  1.6* 1.8  AST 19  --   --  28 18  --   --   --  14*  --   ALT 13*  --   --  14* 11*  --   --   --  11*  --   ALKPHOS 95  --   --  95 68  --   --   --  61  --   BILITOT 1.0  --   --  1.2 1.4*  --   --   --  0.9  --   < > = values in this interval not displayed. ------------------------------------------------------------------------------------------------------------------  Cardiac Enzymes  Recent Labs Lab 03/26/16 0533  TROPONINI <0.03   Inpatient Medications  Scheduled Meds: . chlordiazePOXIDE  25 mg Oral TID  . diltiazem  120 mg Oral Daily  . feeding supplement (ENSURE ENLIVE)  237 mL Oral BID BM  . lisinopril  20 mg Oral Daily  . sodium chloride flush  3 mL Intravenous Q12H   Continuous Infusions:  PRN Meds:.diltiazem, hydrALAZINE, LORazepam, ondansetron **OR** ondansetron (ZOFRAN) IV  Micro Results Recent Results (from the past 240 hour(s))  MRSA PCR Screening     Status: None   Collection Time: 03/26/16  2:04 AM  Result Value Ref Range Status   MRSA by PCR NEGATIVE NEGATIVE Final    Comment:        The GeneXpert MRSA Assay (FDA approved for NASAL specimens only), is one component of a comprehensive MRSA colonization surveillance program. It is not intended to diagnose MRSA infection nor to guide or monitor treatment for MRSA infections.   Culture, blood (routine x 2)     Status: None (Preliminary result)   Collection Time: 03/26/16 10:20 AM  Result Value Ref Range Status   Specimen Description BLOOD BLOOD LEFT ARM  Final   Special Requests IN PEDIATRIC BOTTLE 2CC  Final   Culture NO GROWTH 4 DAYS  Final   Report Status PENDING  Incomplete  Culture, blood (routine x 2)     Status: None (Preliminary result)   Collection Time: 03/26/16  1:30 PM  Result Value Ref Range Status   Specimen  Description BLOOD BLOOD LEFT ARM  Final   Special Requests IN PEDIATRIC BOTTLE 1.0 CC  Final   Culture NO GROWTH 4  DAYS  Final   Report Status PENDING  Incomplete    Radiology Reports Dg Chest 2 View  Result Date: 03/25/2016 CLINICAL DATA:  Syncope.  Ibuprofen overdose. EXAM: CHEST  2 VIEW COMPARISON:  11/06/2010. FINDINGS: Normal sized heart. Tortuous aorta. Clear lungs with normal vascularity. Right ventriculoperitoneal shunt tube. Unremarkable bones. IMPRESSION: No acute abnormality. Electronically Signed   By: Beckie Salts M.D.   On: 03/25/2016 16:28   Ct Head Wo Contrast  Result Date: 03/28/2016 CLINICAL DATA:  Initial evaluation for acute altered mental status, fall out of that. EXAM: CT HEAD WITHOUT CONTRAST TECHNIQUE: Contiguous axial images were obtained from the base of the skull through the vertex without intravenous contrast. COMPARISON:  Comparison made with prior CT from 03/25/2016. FINDINGS: Brain: Cerebral atrophy with chronic microvascular ischemic disease noted, stable. Right parietal approach VP shunt catheter remains in place with tip at the left basal ganglia, stable. Ventricular size is unchanged without evidence for hydrocephalus. Streak artifact from surgical clips from prior aneurysm repair again seen. No evidence for acute intracranial hemorrhage. No findings to suggest acute large vessel territory infarct. No mass lesion, midline shift, or mass effect. There is a left holo hemispheric subdural collection overlying the left cerebral hemisphere. This measures up to 4 mm in maximal thickness. Collection is largely I so to low-density as compared to the adjacent brain, compatible with a sub acute collection. No appreciable there is trace 3 mm left-to-right shift. Upon review of prior CT of from 03/25/2016, this collection appears to have been present on previous exam, although extremely difficult to visualize on that study, as this collection was only trace in size on that exam. Trace left-to-right shift is not significantly changed. No hyperdense component within this collection to suggest  that this has acutely bled with recent fall. This collection is suspected to at least in part be related to the patient's shunt. No other extra-axial fluid collection. Vascular: No hyperdense vessel, although evaluation limited by streak artifact from aneurysm clip. Skull: Scalp soft tissues demonstrate no acute abnormality. Calvarium unchanged. Postoperative changes from prior right pterional craniotomy again seen. Sinuses/Orbits: Globes and orbital soft tissues within normal limits. Scattered mucosal thickening within the ethmoidal air cells and right maxillary sinus. Paranasal sinuses are otherwise clear. No mastoid effusion. IMPRESSION: 1. Left holo hemispheric subdural collection measuring up to 4 mm as above, subacute to chronic in appearance. Although not mentioned previously, this collection was likely present on previous exam from 03/25/2016, although extremely difficult to visualize on that study as this was only trace in size at that time. The apparent interval increase in size suspected to be related to shunting. No internal hyperdensity to suggest that this has acutely bled with recent fall. 2. Trace 2-3 mm of left-to-right shift, relatively stable. 3. No other acute intracranial process. 4. 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 5. Right parietal approach VP shunt in stable position. No hydrocephalus. Electronically Signed   By: Rise Mu M.D.   On: 03/28/2016 01:59   Ct Head Wo Contrast  Result Date: 03/25/2016 CLINICAL DATA:  60 year old male with a history of altered mental status EXAM: CT HEAD WITHOUT CONTRAST TECHNIQUE: Contiguous axial images were obtained from the base of the skull through the vertex without intravenous contrast. COMPARISON:  07/31/2014, 11/06/2010 FINDINGS:  Brain: No acute intracranial hemorrhage. No midline shift or mass effect. Unremarkable appearance of the ventricular system.  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 Skull: Surgical changes of right pterional craniotomy for surgical clipping of aneurysm. No acute bony abnormality. Right posterior parietal approach ventriculostomy terminates to the left of midline. Sinuses/Orbits: No significant sinus disease. Other: None IMPRESSION: No CT evidence of acute intracranial abnormality. 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 Signed, Yvone Neu. Loreta Ave, DO [facility] Electronically Signed   By: Gilmer Mor D.O.   On: 03/25/2016 20:30   Ct Chest Wo Contrast  Result Date: 03/26/2016 CLINICAL DATA:  Shortness of breath. Concern for aspiration pneumonitis EXAM: CT CHEST WITHOUT CONTRAST TECHNIQUE: Multidetector CT imaging of the chest was performed following the standard protocol without IV contrast. COMPARISON:  Chest radiograph March 25, 2016 FINDINGS: Cardiovascular: The ascending thoracic aortic diameter is measured at 4.0 x 4.0 cm, measured on axial slice 59 series 201. The thoracic aorta is somewhat ectatic. There are scattered foci of atherosclerotic calcification in the aorta. The visualized great vessels appear unremarkable on this noncontrast enhanced study. There is slight pericardial thickening without pericardial effusion evident. Mediastinum/Nodes: Thyroid appears unremarkable. There is no evident thoracic adenopathy. Lungs/Pleura: There is mild scarring in the lung apices with slight bullous disease in the extreme apices bilaterally. There are scattered small bullae throughout the lungs bilaterally. There is bibasilar atelectatic change, slightly more on the right than on the left. There is no frank edema or consolidation. There is no pleural effusion or pleural  thickening evident. Upper Abdomen: Visualized upper abdominal structures appear unremarkable. Musculoskeletal: There are no blastic or lytic bone lesions. IMPRESSION: Bibasilar atelectatic change, slightly more on the right than on the left, likely due to dependent positioning. No edema or consolidation. No findings felt to represent aspiration pneumonitis. No adenopathy. Prominence of the ascending thoracic aorta with a measured diameter 4.0 x 4.0 cm. Recommend annual imaging followup by CTA or MRA. This recommendation follows 2010 ACCF/AHA/AATS/ACR/ASA/SCA/SCAI/SIR/STS/SVM Guidelines for the Diagnosis and Management of Patients with Thoracic Aortic Disease. Circulation. 2010; 121: R604-V409. Thoracic aorta somewhat ectatic with scattered foci of atherosclerotic calcification. Borderline pericardial thickening without pericardial effusion. Electronically Signed   By: Bretta Bang III M.D.   On: 03/26/2016 10:48    Time Spent in minutes  35  MAGICK-Tyjuan Demetro M.D on 03/31/2016 at 1:23 PM  Between 7am to 7pm - Pager - [phone]  After 7pm go to [email or web address] - password [facility]  [facility] -  Office  [phone]

## 2016-04-01 DIAGNOSIS — F10231 Alcohol dependence with withdrawal delirium: Principal | ICD-10-CM

## 2016-04-01 LAB — BASIC METABOLIC PANEL
ANION GAP: 10 (ref 5–15)
BUN: 8 mg/dL (ref 6–20)
CALCIUM: 9.2 mg/dL (ref 8.9–10.3)
CO2: 24 mmol/L (ref 22–32)
Chloride: 106 mmol/L (ref 101–111)
Creatinine, Ser: 1.17 mg/dL (ref 0.61–1.24)
GFR calc Af Amer: >60 mL/min (ref >60)
GLUCOSE: 100 mg/dL — AB (ref 65–99)
Potassium: 3.4 mmol/L — ABNORMAL LOW (ref 3.5–5.1)
Sodium: 140 mmol/L (ref 135–145)

## 2016-04-01 LAB — GLUCOSE, CAPILLARY: GLUCOSE-CAPILLARY: 90 mg/dL (ref 65–99)

## 2016-04-01 LAB — CBC
HCT: 36.9 % — ABNORMAL LOW (ref 39.0–52.0)
HEMOGLOBIN: 12.9 g/dL — AB (ref 13.0–17.0)
MCH: 29.7 pg (ref 26.0–34.0)
MCHC: 35 g/dL (ref 30.0–36.0)
MCV: 84.8 fL (ref 78.0–100.0)
Platelets: 384 10*3/uL (ref 150–400)
RBC: 4.35 MIL/uL (ref 4.22–5.81)
RDW: 14.7 % (ref 11.5–15.5)
WBC: 11.5 10*3/uL — ABNORMAL HIGH (ref 4.0–10.5)

## 2016-04-01 LAB — MAGNESIUM: MAGNESIUM: 1.6 mg/dL — AB (ref 1.7–2.4)

## 2016-04-01 MED ORDER — HYDRALAZINE HCL 25 MG PO TABS
25.0000 mg | ORAL_TABLET | Freq: Three times a day (TID) | ORAL | Status: DC
  Administered 2016-04-01 – 2016-04-03 (×6): 25 mg via ORAL
  Filled 2016-04-01 (×7): qty 1

## 2016-04-01 MED ORDER — MAGNESIUM SULFATE 2 GM/50ML IV SOLN
2.0000 g | Freq: Once | INTRAVENOUS | Status: AC
  Administered 2016-04-01: 2 g via INTRAVENOUS
  Filled 2016-04-01: qty 50

## 2016-04-01 MED ORDER — POTASSIUM CHLORIDE IN NACL 40-0.9 MEQ/L-% IV SOLN
INTRAVENOUS | Status: AC
  Administered 2016-04-01: 75 mL/h via INTRAVENOUS
  Filled 2016-04-01 (×2): qty 1000

## 2016-04-01 NOTE — Progress Notes (Signed)
Physical Therapy Treatment Patient Details Name: Leonard HartshornRonald Forbes MRN: [id number] DOB: 10-11-1955 Today's Date: 04/01/2016    History of Present Illness Pt adm with syncope. Pt with ?sepsis and now with ETOH withdrawal. Pt with fall on 12/21 with CT showing small subacute SDH present on admission. PMH - polysubstance abuse. aneurysm, craniotomy, vp shunt, HTN, CVA    PT Comments    Treatment limited due to lethargy, but stayed out of supine at [facility] for about 20 min.  Stood x2 once more awake, worked on balance at [employer], washed his face to help himself wake up.  Nursing had been unable to awaken him all day and RN happy to be able to give meds.  Follow Up Recommendations  SNF     Equipment Recommendations  Other (comment) (TBA)    Recommendations for Other Services       Precautions / Restrictions Precautions Precautions: Fall Precaution Comments: etoh withdrawal    Mobility  Bed Mobility Overal bed mobility: Needs Assistance Bed Mobility: Supine to Sit;Sit to Supine     Supine to sit: Max assist Sit to supine: Max assist   General bed mobility comments: lethargy due to overmedication to blame for pt not doing more for himself.  Transfers Overall transfer level: Needs assistance Equipment used: 1 person hand held assist Transfers: Sit to/from Stand Sit to Stand: Mod assist;Max assist         General transfer comment: x2, pt holding to IV pole and knees significantly flexed.  Ambulation/Gait             General Gait Details: pt unable because still too sedated and lethargic.   Stairs            Wheelchair Mobility    Modified Rankin (Stroke Patients Only)       Balance Overall balance assessment: Needs assistance Sitting-balance support: No upper extremity supported;Single extremity supported Sitting balance-Leahy Scale: Poor Sitting balance - Comments: too lethargic to maintain focus for sitting.     Standing balance-Leahy Scale:  Poor Standing balance comment: stood x2 with assist throughout due to pt so lethargic.                    Cognition Arousal/Alertness: Lethargic Behavior During Therapy: Flat affect Overall Cognitive Status: Impaired/Different from baseline Area of Impairment: Orientation;Attention;Memory;Following commands;Safety/judgement;Problem solving Orientation Level: Situation;Time;Place Current Attention Level: Sustained   Following Commands: Follows one step commands inconsistently Safety/Judgement: Decreased awareness of safety;Decreased awareness of deficits   Problem Solving: Slow processing;Decreased initiation;Difficulty sequencing      Exercises      General Comments        Pertinent Vitals/Pain Faces Pain Scale: No hurt    Home Living                      Prior Function            PT Goals (current goals can now be found in the care plan section) Acute Rehab PT Goals Patient Stated Goal: Unable to state. Time For Goal Achievement: 04/04/16 Potential to Achieve Goals: Good    Frequency    Min 3X/week      PT Plan Current plan remains appropriate    Co-evaluation             End of Session   Activity Tolerance: Patient tolerated treatment well;Patient limited by lethargy Patient left: in bed;with call bell/phone within reach;with bed alarm set     Time: [id number] PT  Time Calculation (min) (ACUTE ONLY): 26 min  Charges:  $Therapeutic Activity: 23-37 mins                    G CodesEliseo Gum:      Quinntin Malter V Jesper Stirewalt 04/01/2016, 4:38 PM 04/01/2016  [place] BingKen Lyvonne Cassell, PT [phone]  (pager)

## 2016-04-01 NOTE — Discharge Instructions (Signed)
[facility] 1 [phone]  Please call for the name and phone numbers of doctors accepting new patients.

## 2016-04-01 NOTE — Progress Notes (Addendum)
Patient has had a fall since in the hospital, and now has telesitter for safety. Patient repeatedly tried to get out of bed, was redirected several times by RN and tech. At ten am patient had PRN ativan and has since then been very drowsy/lethargic, unable to safely give PO meds/Ensure. Will continue to monitor.

## 2016-04-01 NOTE — Progress Notes (Signed)
Patient awake,alert,oriented to person.Patient restless,keeps removing his telemetry leads off and trying to get out bed. Safety mitts applied.Patient advised to stay in bed.Telesitter in use.Will continue to monitor. Tarah Buboltz, Drinda Buttsharito Joselita, RCharity fundraiser

## 2016-04-01 NOTE — Progress Notes (Signed)
Nutrition Follow-up  DOCUMENTATION CODES:   Non-severe (moderate) malnutrition in context of chronic illness  INTERVENTION:  Continue Ensure Enlive po BID, each supplement provides 350 kcal and 20 grams of protein.  Encourage adequate PO intake when pt more alert and awake.  NUTRITION DIAGNOSIS:   Increased nutrient needs related to chronic illness as evidenced by estimated needs; ongoing  GOAL:   Patient will meet greater than or equal to 90% of their needs; not met  MONITOR:   PO intake, Supplement acceptance, Labs, Weight trends, I & O's  REASON FOR ASSESSMENT:   Consult Assessment of nutrition requirement/status  ASSESSMENT:   60 y.o. Male with known hx of hypertension, stroke, alcohol abuse who was brought to the emergency department after having a syncopal episode at the urgent care center earlier in the day prior to this admission. Pt was apparently at the [facility] for evaluation of right sided hard palate lesion. Pt reported taking large amount of Ibuprofen. Girlfriend who was present with pt at the [facility], reported pt used cocaine 3 days piror to this admission.  Pt currently on CIWA.  Pt was asleep during time of visit and did not wake up to RD arousal attempts. Per RD note, pt with PRN ativan and has been drowsy/lethargic as pt repeatedly tried to get out of bed. Pt with telesitter for safety. Meal completion 0% today. Pt currently has Ensure ordered. RD to continue with current orders. Encourage adequate PO intake when pt more alert and awake.   Nutrition-Focused physical exam completed. Findings are moderate fat depletion, moderate muscle depletion, and no edema.   Labs and medications reviewed. Potassium low at 3.4. Magnesium low at 1.6.  Diet Order:  Diet Heart Room service appropriate? Yes; Fluid consistency: Thin  Skin:  Reviewed, no issues  Last BM:  12/20  Height:   Ht Readings from Last 1 Encounters:  03/26/16 5' 9" (1.753 m)    Weight:   Wt Readings  from Last 1 Encounters:  03/31/16 126 lb 15.8 oz (57.6 kg)    Ideal Body Weight:  73 kg  BMI:  Body mass index is 18.75 kg/m.  Estimated Nutritional Needs:   Kcal:  1700-1900  Protein:  80-90 gm  Fluid:  1.7-1.9 L  EDUCATION NEEDS:   No education needs identified at this time  Corrin Parker, MS, RD, LDN Pager # [phone] After hours/ weekend pager # [phone]

## 2016-04-01 NOTE — Care Management Note (Addendum)
Case Management Note  Patient Details  Name: Leonard Forbes MRN: [id number] Date of Birth: [birth year]  Subjective/Objective:      CM following for progression and d/c planning.               Action/Plan: 04/01/2016 Noted plan for SNF however also received referral for PCP. Please be aware that we are unable to arrange PCP for pt with insurance. [facility] number for [facility] and for [facility] placed in d/c instructions for pt or family use. If pt is to d/c to SNF , he will be cared for by the SNF PCP during his stay in that facility. Concern that "Group Home" that pt brother refers to may actually be a boarding house.  Will followup.  11:50 am spoke with pt brother, Leonard Forbes re pt living situation . Pt lives in a boarding house "upstairs" per his brother this pt has been having frequent falls since his aneurysm. Brother agrees that the pt may benefit from SNF and short term rehab, however clearly would not encourage the pt to do rehab. Will share this info with the CSW, Leonard Forbes.   Expected Discharge Date:  03/29/16               Expected Discharge Plan:  Skilled Nursing Facility  In-House Referral:  Clinical Social Work  Discharge planning Services  CM Consult  Post Acute Care Choice:  NA Choice offered to:  NA  DME Arranged:    DME Agency:     HH Arranged:    HH Agency:     Status of Service:  In process, will continue to follow  If discussed at Long Length of Stay Meetings, dates discussed:    Additional Comments:  Starlyn SkeansRoyal, Sanaai Doane U, RN 04/01/2016, 11:13 AM

## 2016-04-01 NOTE — Progress Notes (Addendum)
PROGRESS NOTE                                                                                                                                                                                                             Patient Demographics:    Leonard Forbes, is a 60 y.o. male, DOB - [birth year], UJW:[id number]  Admit date - 03/25/2016   Admitting Physician Bobette Mo, MD  Outpatient Primary MD for the patient, pt has no PCP, will need to set up prior to discharge, CM to assist, order placed.  LOS - 7   Chief Complaint  Patient presents with  . Mouth Injury      Brief Narrative   60 year old male wit active alcohol and cocaine abuse, history of stroke and hypertension brought to the ED after having a syncopal episode at the urgent care on the day prior to admission. Patient was at the urgent care center for evaluation of right-sided hard palate lesion. He reportedly took a large amount of ibuprofen and use cocaine 3 days prior to admission.  Patient brought to the ED and was found to be encephalopathic concerning for alcohol intoxication.  Hospital course prolonged with alcohol withdrawal, fall with finding of small subacute subdural hematoma. Pt also with poor appetite, SLP requested which is still pending, PT eval done, SNF recommended but pt wanted to go home. PT lives in Group home per his brother. He is currently back in withdrawal and placed back on CIWA protocol 12/26.   Subjective:   Pt is more alert this AM but still trying to pull out lines, also trying to take SCD's off. Able to answer questions.    Assessment  & Plan :   Principal problem Acute metabolic encephalopathy - Likely combination of alcohol intoxication and withdrawal, cocaine and seen and ? subacute subdural hematoma. - Patient is more alert this AM, able to answer some questions but remains restless and pulling lines, trying to take SCD's off   - BP elevated and slightly tachycardic, in withdrawal again this AM, CIWA score 14 - place back on CIWA, also added Haldol  - may need transfer to SDU today if not better with current CIWA regimen  - Psych evaluated, pt now in agreement to stay, made aware he needs SNF  Acute kidney injury - Prerenal secondary to dehydration. Improved  with fluids. - Cr WNL this AM  Hypokalemia and Hypomagnesemia - both electrolytes have been supplemented but remain low - supplement again this AM and repeat BMP and Mg in AM  Accelerated hypertension - Secondary to alcohol withdrawal. Continue lisinopril, cardizem - added scheduled Hydralazine 35 mg PO TID, also added hydralazine as needed  - SBP in 150 - 160's this AM   PVCs and NSVT, A-fib overnight 12/23 - Supplement Mg and K as noted above  - HR in 110's this AM  - Not AC candidate, high risk fall, hematoma on CT head, pt also with substance abuse  - pt was started on Cardizem here, currently on 120 mg PO QD  Fall on 12/21 - Patient fell from bed when attempting to get up during the night. A head CT showed small subdural collection measuring 4 mm (subdural to chronic). Suggests this is likely present on admission head CT. Shows a trace 2-3 mm left-to-right shift. - PT eval requested, SBF recommended  - SW consulted for assistance   Hard palate lesion - Seen by SLP and cleared for regular diet. - More difficulty again 12/23, SLP re evaluation requested, still pending   ? Sepsis on admission  - Patient tachycardic, mild leukocytosis and elevated lactic acid. No clinical signs of infection. Received IV fluid bolus. - UA unremarkable. CT chest negative for aspiration. - Sepsis ruled out   Prominent ascending thoracic aorta - Measures 44 cm. Recommend annual imaging with CT angiogram or MRI.  Code Status : Full code  Family Communication  : None at bedside, spoke with brother over the phone, says pt lives in group home but will need SNF    Disposition Plan  : not ready yet, again in withdrawal with CIWA 14, can not tell exactly how long he needs to be inpatient as he has had rather inconsistent improvement, will need SNF once medically ready   Barriers For Discharge : Active alcohol withdrawal/encephalopathy  Consults  :  None  Procedures  : Head CT x2  DVT Prophylaxis  :  SCD's  Lab Results  Component Value Date   PLT 384 04/01/2016    Antibiotics  :    Anti-infectives    None        Objective:   Vitals:   03/31/16 2213 03/31/16 2359 04/01/16 0632 04/01/16 0800  BP: (!) 151/103 (!) 151/100 (!) 161/113 (!) 172/118  Pulse:  89 85 (!) 108  Resp: 19 18 16 16   Temp: 98.7 F (37.1 C) 98.6 F (37 C) 97.7 F (36.5 C) 98.2 F (36.8 C)  TempSrc: Axillary Oral Oral Oral  SpO2: 98% 100% 97% 98%  Weight: 57.6 kg (126 lb 15.8 oz)     Height:        Wt Readings from Last 3 Encounters:  03/31/16 57.6 kg (126 lb 15.8 oz)  07/07/14 65.1 kg (143 lb 9.6 oz)  06/22/14 65.3 kg (144 lb)     Intake/Output Summary (Last 24 hours) at 04/01/16 0913 Last data filed at 04/01/16 0853  Gross per 24 hour  Intake              420 ml  Output             2200 ml  Net            -1780 ml   Physical Exam  Gen: more alert, answers some questions, restless, pulling lines out, taking SCD's off  HEENT: Pupils reactive bilaterally, moist mucosa,  supple neck Chest: clear b/l, no added sounds CVS: None S2 tachycardic, no murmurs rubs or gallop GI: soft, NT, ND, BS+ Musculoskeletal: warm, no edema CNS: moves all extremities.   Data Review:    CBC  Recent Labs Lab 03/25/16 1639 03/26/16 0533 03/27/16 0306 03/28/16 0531 03/29/16 0148 03/30/16 0232 03/31/16 0523 04/01/16 0554  WBC 15.2* 15.5* 13.0* 9.2 7.8 7.5 7.8 11.5*  HGB 16.3 15.7 13.5 13.0 14.4 13.7 13.4 12.9*  HCT 48.6 47.0 40.5 38.7* 41.5 39.5 40.0 36.9*  PLT 355 326 334 349 PLATELET CLUMPS NOTED ON SMEAR, COUNT APPEARS DECREASED 368 394 384  MCV 89.5  88.8 86.7 87.6 84.7 85.5 86.6 84.8  MCH 30.0 29.7 28.9 29.4 29.4 29.7 29.0 29.7  MCHC 33.5 33.4 33.3 33.6 34.7 34.7 33.5 35.0  RDW 16.2* 15.8* 15.5 15.5 14.8 15.0 15.1 14.7  LYMPHSABS 1.4 2.3 1.7  --   --   --   --   --   MONOABS 0.8 1.2* 1.2*  --   --   --   --   --   EOSABS 0.0 0.0 0.3  --   --   --   --   --   BASOSABS 0.0 0.0 0.0  --   --   --   --   --     Chemistries   Recent Labs Lab 03/25/16 1639  03/25/16 2104 03/26/16 0533 03/27/16 0306 03/28/16 0531 03/28/16 1032 03/29/16 0148 03/30/16 0232 03/31/16 0523 04/01/16 0554  NA 143  --   --  140 140 140  --  139 140 140 140  K 5.5*  < >  --  3.9 3.7 3.8  --  4.0 3.4* 3.8 3.4*  CL 108  --   --  107 111 110  --  111 111 109 106  CO2 23  --   --  25 20* 20*  --  19* 20* 26 24  GLUCOSE 112*  --   --  98 85 89  --  82 89 105* 100*  BUN 12  --   --  10 11 9   --  10 9 9 8   CREATININE 1.47*  --   --  1.28* 1.18 1.09  --  1.16 1.00 1.16 1.17  CALCIUM 9.9  --   --  9.0 8.4* 9.0  --  8.7* 8.8* 8.7* 9.2  MG  --   --  1.9  --   --   --  1.7  --  1.6* 1.8 1.6*  AST 19  --   --  28 18  --   --   --  14*  --   --   ALT 13*  --   --  14* 11*  --   --   --  11*  --   --   ALKPHOS 95  --   --  95 68  --   --   --  61  --   --   BILITOT 1.0  --   --  1.2 1.4*  --   --   --  0.9  --   --   < > = values in this interval not displayed. ------------------------------------------------------------------------------------------------------------------  Cardiac Enzymes  Recent Labs Lab 03/26/16 0533  TROPONINI <0.03   Inpatient Medications  Scheduled Meds: . chlordiazePOXIDE  25 mg Oral TID  . diltiazem  120 mg Oral Daily  . feeding supplement (ENSURE ENLIVE)  237 mL Oral BID BM  .  lisinopril  20 mg Oral Daily  . sodium chloride flush  3 mL Intravenous Q12H   Continuous Infusions: . 0.9 % NaCl with KCl 40 mEq / L     PRN Meds:.hydrALAZINE, LORazepam, ondansetron **OR** ondansetron (ZOFRAN) IV  Micro Results Recent Results  (from the past 240 hour(s))  MRSA PCR Screening     Status: None   Collection Time: 03/26/16  2:04 AM  Result Value Ref Range Status   MRSA by PCR NEGATIVE NEGATIVE Final    Comment:        The GeneXpert MRSA Assay (FDA approved for NASAL specimens only), is one component of a comprehensive MRSA colonization surveillance program. It is not intended to diagnose MRSA infection nor to guide or monitor treatment for MRSA infections.   Culture, blood (routine x 2)     Status: None   Collection Time: 03/26/16 10:20 AM  Result Value Ref Range Status   Specimen Description BLOOD BLOOD LEFT ARM  Final   Special Requests IN PEDIATRIC BOTTLE 2CC  Final   Culture NO GROWTH 5 DAYS  Final   Report Status 03/31/2016 FINAL  Final  Culture, blood (routine x 2)     Status: None   Collection Time: 03/26/16  1:30 PM  Result Value Ref Range Status   Specimen Description BLOOD BLOOD LEFT ARM  Final   Special Requests IN PEDIATRIC BOTTLE 1.0 CC  Final   Culture NO GROWTH 5 DAYS  Final   Report Status 03/31/2016 FINAL  Final    Radiology Reports Dg Chest 2 View  Result Date: 03/25/2016 CLINICAL DATA:  Syncope.  Ibuprofen overdose. EXAM: CHEST  2 VIEW COMPARISON:  11/06/2010. FINDINGS: Normal sized heart. Tortuous aorta. Clear lungs with normal vascularity. Right ventriculoperitoneal shunt tube. Unremarkable bones. IMPRESSION: No acute abnormality. Electronically Signed   By: Beckie Salts M.D.   On: 03/25/2016 16:28   Ct Head Wo Contrast  Result Date: 03/28/2016 CLINICAL DATA:  Initial evaluation for acute altered mental status, fall out of that. EXAM: CT HEAD WITHOUT CONTRAST TECHNIQUE: Contiguous axial images were obtained from the base of the skull through the vertex without intravenous contrast. COMPARISON:  Comparison made with prior CT from 03/25/2016. FINDINGS: Brain: Cerebral atrophy with chronic microvascular ischemic disease noted, stable. Right parietal approach VP shunt catheter remains  in place with tip at the left basal ganglia, stable. Ventricular size is unchanged without evidence for hydrocephalus. Streak artifact from surgical clips from prior aneurysm repair again seen. No evidence for acute intracranial hemorrhage. No findings to suggest acute large vessel territory infarct. No mass lesion, midline shift, or mass effect. There is a left holo hemispheric subdural collection overlying the left cerebral hemisphere. This measures up to 4 mm in maximal thickness. Collection is largely I so to low-density as compared to the adjacent brain, compatible with a sub acute collection. No appreciable there is trace 3 mm left-to-right shift. Upon review of prior CT of from 03/25/2016, this collection appears to have been present on previous exam, although extremely difficult to visualize on that study, as this collection was only trace in size on that exam. Trace left-to-right shift is not significantly changed. No hyperdense component within this collection to suggest that this has acutely bled with recent fall. This collection is suspected to at least in part be related to the patient's shunt. No other extra-axial fluid collection. Vascular: No hyperdense vessel, although evaluation limited by streak artifact from aneurysm clip. Skull: Scalp soft tissues demonstrate no acute  abnormality. Calvarium unchanged. Postoperative changes from prior right pterional craniotomy again seen. Sinuses/Orbits: Globes and orbital soft tissues within normal limits. Scattered mucosal thickening within the ethmoidal air cells and right maxillary sinus. Paranasal sinuses are otherwise clear. No mastoid effusion. IMPRESSION: 1. Left holo hemispheric subdural collection measuring up to 4 mm as above, subacute to chronic in appearance. Although not mentioned previously, this collection was likely present on previous exam from 03/25/2016, although extremely difficult to visualize on that study as this was only trace in size at  that time. The apparent interval increase in size suspected to be related to shunting. No internal hyperdensity to suggest that this has acutely bled with recent fall. 2. Trace 2-3 mm of left-to-right shift, relatively stable. 3. No other acute intracranial process. 4. 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 5. Right parietal approach VP shunt in stable position. No hydrocephalus. Electronically Signed   By: Rise Mu M.D.   On: 03/28/2016 01:59   Ct Head Wo Contrast  Result Date: 03/25/2016 CLINICAL DATA:  60 year old male with a history of altered mental status EXAM: CT HEAD WITHOUT CONTRAST TECHNIQUE: Contiguous axial images were obtained from the base of the skull through the vertex without intravenous contrast. COMPARISON:  07/31/2014, 11/06/2010 FINDINGS: Brain: No acute intracranial hemorrhage. No midline shift or mass effect. Unremarkable appearance of the ventricular system. 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 Skull: Surgical changes of right pterional craniotomy for surgical clipping of aneurysm. No acute bony abnormality. Right posterior parietal approach ventriculostomy terminates to the left of midline. Sinuses/Orbits: No significant sinus disease. Other: None IMPRESSION: No CT evidence of acute intracranial abnormality. 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 Signed, Yvone Neu. Loreta Ave, DO [facility] Radiology Electronically Signed   By: Gilmer Mor D.O.   On: 03/25/2016 20:30   Ct Chest Wo Contrast  Result Date: 03/26/2016 CLINICAL DATA:  Shortness of breath. Concern for aspiration pneumonitis EXAM: CT CHEST WITHOUT CONTRAST TECHNIQUE: Multidetector  CT imaging of the chest was performed following the standard protocol without IV contrast. COMPARISON:  Chest radiograph March 25, 2016 FINDINGS: Cardiovascular: The ascending thoracic aortic diameter is measured at 4.0 x 4.0 cm, measured on axial slice 59 series 201. The thoracic aorta is somewhat ectatic. There are scattered foci of atherosclerotic calcification in the aorta. The visualized great vessels appear unremarkable on this noncontrast enhanced study. There is slight pericardial thickening without pericardial effusion evident. Mediastinum/Nodes: Thyroid appears unremarkable. There is no evident thoracic adenopathy. Lungs/Pleura: There is mild scarring in the lung apices with slight bullous disease in the extreme apices bilaterally. There are scattered small bullae throughout the lungs bilaterally. There is bibasilar atelectatic change, slightly more on the right than on the left. There is no frank edema or consolidation. There is no pleural effusion or pleural thickening evident. Upper Abdomen: Visualized upper abdominal structures appear unremarkable. Musculoskeletal: There are no blastic or lytic bone lesions. IMPRESSION: Bibasilar atelectatic change, slightly more on the right than on the left, likely due to dependent positioning. No edema or consolidation. No findings felt to represent aspiration pneumonitis. No adenopathy. Prominence of the ascending thoracic aorta with a measured diameter 4.0 x 4.0 cm. Recommend annual imaging followup by CTA or MRA. This recommendation follows 2010 ACCF/AHA/AATS/ACR/ASA/SCA/SCAI/SIR/STS/SVM Guidelines for the Diagnosis  and Management of Patients with Thoracic Aortic Disease. Circulation. 2010; 121: A540-J811: [id number]. Thoracic aorta somewhat ectatic with scattered foci of atherosclerotic calcification. Borderline pericardial thickening without pericardial effusion. Electronically Signed   By: Bretta BangWilliam  Woodruff III M.D.   On: 03/26/2016 10:48    Time Spent in minutes   35  MAGICK-Mirelle Biskup M.D on 04/01/2016 at 9:13 AM  Between 7am to 7pm - Pager - [phone]  After 7pm go to [email or web address] - password [email or web address]  [facility] -  Office  [phone]

## 2016-04-01 NOTE — Evaluation (Signed)
Clinical/Bedside Swallow Evaluation Patient Details  Name: Leonard HartshornRonald Plemmons MRN: [id number] Date of Birth: Jan 03, 1956  Today's Date: 04/01/2016 Time:        Past Medical History:  Past Medical History:  Diagnosis Date  . Headache   . Headache disorder 06/22/2014  . 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 Hypertension   . Stroke [facility])    Past Surgical History:  Past Surgical History:  Procedure Laterality Date  . CAROTID ENDARTERECTOMY    . CRANIOTOMY     with clipping  . orif mandible Left   . VENTRICULOPERITONEAL SHUNT     HPI:  60 y.o.malewith known hx ofhypertension, stroke, alcohol abuse who was brought to the emergency department after having a syncopal episode at the urgent care center earlier in the day prior to this admission. [facility]. Pt reported taking large amount of Ibuprofen. [facility] reported pt used cocaine 3 days piror to this admission. Dx with acute metabolic encephalopathy, multifactorial secondary to cocaine ingenstion, ? ETOH.    Assessment / Plan / Recommendation Clinical Impression  SLP again ordered due to pts poor ability to swallow pills when lethargic. SLP attempted to awaken pt with repositioning, oral care, cool cloth. Pt woke minimally, and Pos were attempted, but pt could not sustain arousal to take sips and did not respond appropriately to  Po presentation to lips. RN reports she has been giving pt meds crushed in puree and can get him to take those when awake. Again, as documented in prior assessment, pt does not have dysphagia; he is lethargic due to ativan. This is not something SLP intervention can change. Suggest medical management to improve arousal for PO intake. SLP will sign off.     Aspiration Risk       Diet Recommendation Regular;Thin liquid   Liquid Administration via: Straw;Cup Medication Administration: Crushed with  puree Supervision: Staff to assist with self feeding;Full supervision/cueing for compensatory strategies Compensations: Slow rate;Small sips/bites Postural Changes: Seated upright at 90 degrees    Other  Recommendations Oral Care Recommendations: Oral care BID   Follow up Recommendations        Frequency and Duration            Prognosis        Swallow Study   General HPI: 60 y.o.malewith known hx ofhypertension, stroke, alcohol abuse who was brought to the emergency department after having a syncopal episode at the urgent care center earlier in the day prior to this admission. [facility]. Pt reported taking large amount of Ibuprofen. [facility] reported pt used cocaine 3 days piror to this admission. Dx with acute metabolic encephalopathy, multifactorial secondary to cocaine ingenstion, ? ETOH.  Type of Study: Bedside Swallow Evaluation Previous Swallow Assessment: BSE on 12/22 - regular/thin, reordered.  Diet Prior to this Study: Regular;Thin liquids Temperature Spikes Noted: No Respiratory Status: Room air History of Recent Intubation: No Behavior/Cognition: Lethargic/Drowsy;Agitated Oral Care Completed by SLP: Yes Oral Cavity - Dentition: Missing dentition Vision: Functional for self-feeding Self-Feeding Abilities: Total assist Patient Positioning: Upright in bed Baseline Vocal Quality: Normal Volitional Cough: Cognitively unable to elicit    Oral/Motor/Sensory Function     Ice Chips     Thin Liquid Thin Liquid: Impaired Presentation: Straw;Cup Oral Phase Impairments: Poor awareness  of bolus Oral Phase Functional Implications: Right anterior spillage;Left anterior spillage Pharyngeal  Phase Impairments: Suspected delayed Swallow    Nectar Thick Nectar Thick Liquid: Not tested   Honey Thick Honey Thick Liquid: Not tested   Puree Puree: Impaired Presentation:  Spoon Oral Phase Impairments: Poor awareness of bolus   Solid   GO   Solid: Not tested       Harlon DittyBonnie Kamrie Fanton, MA CCC-SLP [phone]  Chante Mayson, Riley NearingBonnie Caroline 04/01/2016,12:45 PM

## 2016-04-01 NOTE — Care Management Important Message (Signed)
Important Message  Patient Details  Name: Leonard Forbes MRN: [id number] Date of Birth: [birth year]   Medicare Important Message Given:  Yes    Dorena BodoIris Starlene Consuegra 04/01/2016, 2:17 PM

## 2016-04-02 DIAGNOSIS — E440 Moderate protein-calorie malnutrition: Secondary | ICD-10-CM | POA: Insufficient documentation

## 2016-04-02 DIAGNOSIS — Z8489 Family history of other specified conditions: Secondary | ICD-10-CM

## 2016-04-02 DIAGNOSIS — Z810 Family history of intellectual disabilities: Secondary | ICD-10-CM

## 2016-04-02 DIAGNOSIS — I10 Essential (primary) hypertension: Secondary | ICD-10-CM

## 2016-04-02 DIAGNOSIS — F10230 Alcohol dependence with withdrawal, uncomplicated: Secondary | ICD-10-CM

## 2016-04-02 LAB — CBC
HCT: 38.4 % — ABNORMAL LOW (ref 39.0–52.0)
HEMOGLOBIN: 13.3 g/dL (ref 13.0–17.0)
MCH: 29.3 pg (ref 26.0–34.0)
MCHC: 34.6 g/dL (ref 30.0–36.0)
MCV: 84.6 fL (ref 78.0–100.0)
PLATELETS: 393 10*3/uL (ref 150–400)
RBC: 4.54 MIL/uL (ref 4.22–5.81)
RDW: 14.8 % (ref 11.5–15.5)
WBC: 10 10*3/uL (ref 4.0–10.5)

## 2016-04-02 LAB — GLUCOSE, CAPILLARY
GLUCOSE-CAPILLARY: 94 mg/dL (ref 65–99)
GLUCOSE-CAPILLARY: 111 mg/dL — AB (ref 65–99)

## 2016-04-02 LAB — BASIC METABOLIC PANEL
ANION GAP: 9 (ref 5–15)
BUN: 14 mg/dL (ref 6–20)
CHLORIDE: 105 mmol/L (ref 101–111)
CO2: 26 mmol/L (ref 22–32)
Calcium: 8.9 mg/dL (ref 8.9–10.3)
Creatinine, Ser: 1.2 mg/dL (ref 0.61–1.24)
Glucose, Bld: 92 mg/dL (ref 65–99)
POTASSIUM: 3.9 mmol/L (ref 3.5–5.1)
SODIUM: 140 mmol/L (ref 135–145)

## 2016-04-02 LAB — MAGNESIUM: MAGNESIUM: 2 mg/dL (ref 1.7–2.4)

## 2016-04-02 MED ORDER — CHLORDIAZEPOXIDE HCL 5 MG PO CAPS
25.0000 mg | ORAL_CAPSULE | Freq: Two times a day (BID) | ORAL | Status: DC
  Administered 2016-04-03: 25 mg via ORAL
  Filled 2016-04-02: qty 5

## 2016-04-02 MED ORDER — LORAZEPAM 2 MG/ML IJ SOLN
2.0000 mg | Freq: Once | INTRAMUSCULAR | Status: AC
  Administered 2016-04-02: 2 mg via INTRAVENOUS
  Filled 2016-04-02: qty 1

## 2016-04-02 NOTE — Progress Notes (Signed)
PROGRESS NOTE        PATIENT DETAILS Name: Leonard Forbes Age: 60 y.o. Sex: male Date of Birth: [birth year] Admit Date: 03/25/2016 Admitting Physician Bobette Mo, MD PCP:No PCP Per Patient  Brief Narrative: Patient is a 60 y.o. male with history of alcohol/cocaine use-who initially presented to the ED for evaluation of mouth pain ongoing 3 days prior to this admission-in the emergency room he was then found to be agitated and confused. He was subsequently admitted for further evaluation and treatment.  Subjective: Awake-alert-answers most of my questions appropriately. He is very slow and frail/chronic basis sick appearing. As a cachectic appearance.  Assessment/Plan: Acute encephalopathy: Suspect related to alcohol and cocaine withdrawal. His mentation seems to be slowly improving,  start tapering Librium-we will attempt to minimize sedative medications. Note seen by psychiatry again on 12/27 he does not have decisional capacity with respect to be discharged to group home.   Acute kidney injury: Resolved, likely mild prerenal azotemia due to dehydration.  Hypokalemia/hypomagnesemia: Repleted. Recheck periodically.  Paroxysmal atrial fibrillation: Back in sinus rhythm. Continue Cardizem, not a anticoagulation candidate given history of alcoholism/cocaine use/prior history of hemorrhagic CVA.  Hypertension: Controlled, continue Cardizem, lisinopril, hydralazine.  Subacute/chronic subdural hematoma: Per CT report-this appears stable and chronic.  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 CT head on 12/22 showed no hydrocephalus-VP shunt in stable position.  Prominent ascending thoracic aorta: Measures 44 cm. Recommend annual imaging with CT angiogram or MRI. This will be deferred to his outpatient M.D.  Malnutrition of  moderate degree: Continue supplements  DVT Prophylaxis: SCD's  Code Status: Full code   Family Communication: None at bedside  Disposition Plan: Remain inpatient-but will plan on SNF on discharge in the next few days  Antimicrobial agents: None  Procedures: TTE 12/20>>EF 65-70%, grade 1 diastolic dysfunction  CONSULTS:  psychiatry  Time spent: 25- minutes-Greater than 50% of this time was spent in counseling, explanation of diagnosis, planning of further management, and coordination of care.  MEDICATIONS: Anti-infectives    None      Scheduled Meds: . [START ON 04/03/2016] chlordiazePOXIDE  25 mg Oral BID  . diltiazem  120 mg Oral Daily  . feeding supplement (ENSURE ENLIVE)  237 mL Oral BID BM  . hydrALAZINE  25 mg Oral Q8H  . lisinopril  20 mg Oral Daily  . sodium chloride flush  3 mL Intravenous Q12H   Continuous Infusions: PRN Meds:.hydrALAZINE, LORazepam, ondansetron **OR** ondansetron (ZOFRAN) IV   PHYSICAL EXAM: Vital signs: Vitals:   04/02/16 0820 04/02/16 0929 04/02/16 0945 04/02/16 1456  BP: (!) 136/94 (!) 136/94 (!) 125/93 134/72  Pulse: 80 90 (!) 101   Resp:      Temp:   98 F (36.7 C)   TempSrc:   Oral   SpO2:   98%   Weight:      Height:       Filed Weights   03/30/16 2009 03/31/16 2213 04/01/16 2316  Weight: 57.4 kg (126 lb 8.7 oz) 57.6 kg (126 lb 15.8 oz) 55.6 kg (122 lb 9.2 oz)   Body mass index is 18.1 kg/m.   General appearance :Awake, alert, chronically ill-appearing-but follows commands. Eyes:, pupils equally reactive to light and accomodation,no scleral icterus. HEENT: Atraumatic and  Normocephalic Neck: supple, no JVD. No cervical lymphadenopathy.  Resp:Good air entry bilaterally, no added sounds  CVS: S1 S2 regular GI: Bowel sounds present, Non tender and not distended with no gaurding, rigidity or rebound.No organomegaly Extremities: B/L Lower Ext shows no edema, both legs are warm to touch Neurology:  speech clear,Non  focal-but with significant generalized weakness Musculoskeletal:No digital cyanosis Skin:No Rash, warm and dry Wounds:N/A  I have personally reviewed following labs and imaging studies  LABORATORY DATA: CBC:  Recent Labs Lab 03/27/16 0306  03/29/16 0148 03/30/16 0232 03/31/16 0523 04/01/16 0554 04/02/16 0559  WBC 13.0*  < > 7.8 7.5 7.8 11.5* 10.0  NEUTROABS 9.7*  --   --   --   --   --   --   HGB 13.5  < > 14.4 13.7 13.4 12.9* 13.3  HCT 40.5  < > 41.5 39.5 40.0 36.9* 38.4*  MCV 86.7  < > 84.7 85.5 86.6 84.8 84.6  PLT 334  < > PLATELET CLUMPS NOTED ON SMEAR, COUNT APPEARS DECREASED 368 394 384 393  < > = values in this interval not displayed.  Basic Metabolic Panel:  Recent Labs Lab 03/28/16 1032 03/29/16 0148 03/30/16 0232 03/31/16 0523 04/01/16 0554 04/02/16 0559  NA  --  139 140 140 140 140  K  --  4.0 3.4* 3.8 3.4* 3.9  CL  --  111 111 109 106 105  CO2  --  19* 20* 26 24 26   GLUCOSE  --  82 89 105* 100* 92  BUN  --  10 9 9 8 14   CREATININE  --  1.16 1.00 1.16 1.17 1.20  CALCIUM  --  8.7* 8.8* 8.7* 9.2 8.9  MG 1.7  --  1.6* 1.8 1.6* 2.0    GFR: Estimated Creatinine Clearance: 50.8 mL/min (by C-G formula based on SCr of 1.2 mg/dL).  Liver Function Tests:  Recent Labs Lab 03/27/16 0306 03/30/16 0232  AST 18 14*  ALT 11* 11*  ALKPHOS 68 61  BILITOT 1.4* 0.9  PROT 6.4* 6.4*  ALBUMIN 3.0* 3.0*   No results for input(s): LIPASE, AMYLASE in the last 168 hours. No results for input(s): AMMONIA in the last 168 hours.  Coagulation Profile: No results for input(s): INR, PROTIME in the last 168 hours.  Cardiac Enzymes: No results for input(s): CKTOTAL, CKMB, CKMBINDEX, TROPONINI in the last 168 hours.  BNP (last 3 results) No results for input(s): PROBNP in the last 8760 hours.  HbA1C: No results for input(s): HGBA1C in the last 72 hours.  CBG:  Recent Labs Lab 03/30/16 0838 03/30/16 1734 04/01/16 0805 04/02/16 0759 04/02/16 1140  GLUCAP 91  118* 90 94 111*    Lipid Profile: No results for input(s): CHOL, HDL, LDLCALC, TRIG, CHOLHDL, LDLDIRECT in the last 72 hours.  Thyroid Function Tests: No results for input(s): TSH, T4TOTAL, FREET4, T3FREE, THYROIDAB in the last 72 hours.  Anemia Panel: No results for input(s): VITAMINB12, FOLATE, FERRITIN, TIBC, IRON, RETICCTPCT in the last 72 hours.  Urine analysis:    Component Value Date/Time   COLORURINE YELLOW 03/25/2016 1555   APPEARANCEUR CLEAR 03/25/2016 1555   LABSPEC 1.014 03/25/2016 1555   PHURINE 5.0 03/25/2016 1555   GLUCOSEU NEGATIVE 03/25/2016 1555   HGBUR NEGATIVE 03/25/2016 1555   BILIRUBINUR NEGATIVE 03/25/2016 1555   KETONESUR 20 (A) 03/25/2016 1555   PROTEINUR 30 (A) 03/25/2016 1555   UROBILINOGEN 0.2 11/05/2010 2248   NITRITE NEGATIVE 03/25/2016 1555   LEUKOCYTESUR NEGATIVE 03/25/2016 1555  Sepsis Labs: Lactic Acid, Venous    Component Value Date/Time   LATICACIDVEN 1.1 03/27/2016 1901    MICROBIOLOGY: Recent Results (from the past 240 hour(s))  MRSA PCR Screening     Status: None   Collection Time: 03/26/16  2:04 AM  Result Value Ref Range Status   MRSA by PCR NEGATIVE NEGATIVE Final    Comment:        The GeneXpert MRSA Assay (FDA approved for NASAL specimens only), is one component of a comprehensive MRSA colonization surveillance program. It is not intended to diagnose MRSA infection nor to guide or monitor treatment for MRSA infections.   Culture, blood (routine x 2)     Status: None   Collection Time: 03/26/16 10:20 AM  Result Value Ref Range Status   Specimen Description BLOOD BLOOD LEFT ARM  Final   Special Requests IN PEDIATRIC BOTTLE 2CC  Final   Culture NO GROWTH 5 DAYS  Final   Report Status 03/31/2016 FINAL  Final  Culture, blood (routine x 2)     Status: None   Collection Time: 03/26/16  1:30 PM  Result Value Ref Range Status   Specimen Description BLOOD BLOOD LEFT ARM  Final   Special Requests IN PEDIATRIC BOTTLE  1.0 CC  Final   Culture NO GROWTH 5 DAYS  Final   Report Status 03/31/2016 FINAL  Final    RADIOLOGY STUDIES/RESULTS: Dg Chest 2 View  Result Date: 03/25/2016 CLINICAL DATA:  Syncope.  Ibuprofen overdose. EXAM: CHEST  2 VIEW COMPARISON:  11/06/2010. FINDINGS: Normal sized heart. Tortuous aorta. Clear lungs with normal vascularity. Right ventriculoperitoneal shunt tube. Unremarkable bones. IMPRESSION: No acute abnormality. Electronically Signed   By: Beckie Salts M.D.   On: 03/25/2016 16:28   Ct Head Wo Contrast  Result Date: 03/28/2016 CLINICAL DATA:  Initial evaluation for acute altered mental status, fall out of that. EXAM: CT HEAD WITHOUT CONTRAST TECHNIQUE: Contiguous axial images were obtained from the base of the skull through the vertex without intravenous contrast. COMPARISON:  Comparison made with prior CT from 03/25/2016. FINDINGS: Brain: Cerebral atrophy with chronic microvascular ischemic disease noted, stable. Right parietal approach VP shunt catheter remains in place with tip at the left basal ganglia, stable. Ventricular size is unchanged without evidence for hydrocephalus. Streak artifact from surgical clips from prior aneurysm repair again seen. No evidence for acute intracranial hemorrhage. No findings to suggest acute large vessel territory infarct. No mass lesion, midline shift, or mass effect. There is a left holo hemispheric subdural collection overlying the left cerebral hemisphere. This measures up to 4 mm in maximal thickness. Collection is largely I so to low-density as compared to the adjacent brain, compatible with a sub acute collection. No appreciable there is trace 3 mm left-to-right shift. Upon review of prior CT of from 03/25/2016, this collection appears to have been present on previous exam, although extremely difficult to visualize on that study, as this collection was only trace in size on that exam. Trace left-to-right shift is not significantly changed. No  hyperdense component within this collection to suggest that this has acutely bled with recent fall. This collection is suspected to at least in part be related to the patient's shunt. No other extra-axial fluid collection. Vascular: No hyperdense vessel, although evaluation limited by streak artifact from aneurysm clip. Skull: Scalp soft tissues demonstrate no acute abnormality. Calvarium unchanged. Postoperative changes from prior right pterional craniotomy again seen. Sinuses/Orbits: Globes and orbital soft tissues within normal limits. Scattered mucosal thickening  within the ethmoidal air cells and right maxillary sinus. Paranasal sinuses are otherwise clear. No mastoid effusion. IMPRESSION: 1. Left holo hemispheric subdural collection measuring up to 4 mm as above, subacute to chronic in appearance. Although not mentioned previously, this collection was likely present on previous exam from 03/25/2016, although extremely difficult to visualize on that study as this was only trace in size at that time. The apparent interval increase in size suspected to be related to shunting. No internal hyperdensity to suggest that this has acutely bled with recent fall. 2. Trace 2-3 mm of left-to-right shift, relatively stable. 3. No other acute intracranial process. 4. 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 5. Right parietal approach VP shunt in stable position. No hydrocephalus. Electronically Signed   By: Rise MuBenjamin  McClintock M.D.   On: 03/28/2016 01:59   Ct Head Wo Contrast  Result Date: 03/25/2016 CLINICAL DATA:  60 year old male with a history of altered mental status EXAM: CT HEAD WITHOUT CONTRAST TECHNIQUE: Contiguous axial images were obtained from the base of the skull through the vertex without intravenous contrast. COMPARISON:  07/31/2014, 11/06/2010 FINDINGS: Brain: No acute intracranial hemorrhage. No midline shift or mass  effect. Unremarkable appearance of the ventricular system. 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 Skull: Surgical changes of right pterional craniotomy for surgical clipping of aneurysm. No acute bony abnormality. Right posterior parietal approach ventriculostomy terminates to the left of midline. Sinuses/Orbits: No significant sinus disease. Other: None IMPRESSION: No CT evidence of acute intracranial abnormality. 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 Signed, Yvone NeuJaime S. Loreta AveWagner, DO [facility] Electronically Signed   By: Gilmer MorJaime  Wagner D.O.   On: 03/25/2016 20:30   Ct Chest Wo Contrast  Result Date: 03/26/2016 CLINICAL DATA:  Shortness of breath. Concern for aspiration pneumonitis EXAM: CT CHEST WITHOUT CONTRAST TECHNIQUE: Multidetector CT imaging of the chest was performed following the standard protocol without IV contrast. COMPARISON:  Chest radiograph March 25, 2016 FINDINGS: Cardiovascular: The ascending thoracic aortic diameter is measured at 4.0 x 4.0 cm, measured on axial slice 59 series 201. The thoracic aorta is somewhat ectatic. There are scattered foci of atherosclerotic calcification in the aorta. The visualized great vessels appear unremarkable on this noncontrast enhanced study. There is slight pericardial thickening without pericardial effusion evident. Mediastinum/Nodes: Thyroid appears unremarkable. There is no evident thoracic adenopathy. Lungs/Pleura: There is mild scarring in the lung apices with slight bullous disease in the extreme apices bilaterally. There are scattered small bullae throughout the lungs bilaterally. There is bibasilar atelectatic change, slightly more on the right than on the left. There is no frank edema or  consolidation. There is no pleural effusion or pleural thickening evident. Upper Abdomen: Visualized upper abdominal structures appear unremarkable. Musculoskeletal: There are no blastic or lytic bone lesions. IMPRESSION: Bibasilar atelectatic change, slightly more on the right than on the left, likely due to dependent positioning. No edema or consolidation. No findings felt to represent aspiration pneumonitis. No adenopathy. Prominence of the ascending thoracic aorta with a measured diameter 4.0 x 4.0 cm. Recommend annual imaging followup by CTA or MRA. This recommendation follows 2010 ACCF/AHA/AATS/ACR/ASA/SCA/SCAI/SIR/STS/SVM Guidelines for the Diagnosis and Management of Patients with Thoracic Aortic Disease. Circulation. 2010; 121: E454-U981: [id number]. Thoracic aorta somewhat ectatic with scattered foci of atherosclerotic calcification. Borderline pericardial  thickening without pericardial effusion. Electronically Signed   By: Bretta BangWilliam  Woodruff III M.D.   On: 03/26/2016 10:48     LOS: 8 days   Jeoffrey MassedGHIMIRE,Kimberlyn Quiocho, MD  [facility] Pager:[phone]  If 7PM-7AM, please contact night-coverage [email or web address] Password [email or web address] 04/02/2016, 4:36 PM
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
## 2016-04-02 NOTE — Progress Notes (Signed)
Physical Therapy Treatment Patient Details Name: Catarina HartshornRonald Sherman MRN: [id number] DOB: 1956-03-06 Today's Date: 04/02/2016    History of Present Illness Pt adm with syncope. Pt with ?sepsis and now with ETOH withdrawal. Pt with fall on 12/21 with CT showing small subacute SDH present on admission. PMH - polysubstance abuse. aneurysm, craniotomy, vp shunt, HTN, CVA    PT Comments    Pt now ready to start progressing.  Pt significantly debilitated at this time with weakness limiting the amount and quality of his mobility.  Follow Up Recommendations  SNF     Equipment Recommendations  Other (comment)    Recommendations for Other Services       Precautions / Restrictions Precautions Precautions: Fall Precaution Comments: etoh withdrawal    Mobility  Bed Mobility Overal bed mobility: Needs Assistance Bed Mobility: Rolling;Sidelying to Sit Rolling: Min guard Sidelying to sit: Min assist       General bed mobility comments: extra time, truncal assist to upright and then guarding.  Transfers Overall transfer level: Needs assistance   Transfers: Sit to/from Stand Sit to Stand: Min assist;+2 physical assistance         General transfer comment: cues for hand placement and good starting position.  Assist to boost up, pt coming forward well.  Stability to help attain upright posture.  Ambulation/Gait Ambulation/Gait assistance: Min assist;Mod assist;+2 physical assistance;+2 safety/equipment Ambulation Distance (Feet): 15 Feet (x2) Assistive device: Rolling walker (2 wheeled) Gait Pattern/deviations: Step-through pattern;Step-to pattern   Gait velocity interpretation: Below normal speed for age/gender General Gait Details: Sagging posture needing consitent assist, flexed/sagging knees bil, L worse than R LE, heavy use of the RW, scissoring and staggering.   Stairs            Wheelchair Mobility    Modified Rankin (Stroke Patients Only)       Balance Overall  balance assessment: Needs assistance Sitting-balance support: Bilateral upper extremity supported;Single extremity supported Sitting balance-Leahy Scale: Poor Sitting balance - Comments: EOB x 5 min working on upright/midline posture, pt tending to list L.  Pt needing bil UE's    Standing balance support: Bilateral upper extremity supported Standing balance-Leahy Scale: Poor Standing balance comment: reliant on assist and AD                    Cognition Arousal/Alertness: Lethargic;Awake/alert Behavior During Therapy: Flat affect;WFL for tasks assessed/performed Overall Cognitive Status: Impaired/Different from baseline (but improving)           Safety/Judgement: Decreased awareness of safety;Decreased awareness of deficits   Problem Solving: Slow processing      Exercises      General Comments        Pertinent Vitals/Pain Pain Assessment: Faces Faces Pain Scale: No hurt    Home Living                      Prior Function            PT Goals (current goals can now be found in the care plan section) Acute Rehab PT Goals Patient Stated Goal: want to go home PT Goal Formulation: With patient Time For Goal Achievement: 04/04/16 Potential to Achieve Goals: Good Progress towards PT goals: Progressing toward goals    Frequency    Min 3X/week      PT Plan Current plan remains appropriate    Co-evaluation             End of Session   Activity Tolerance:  Patient tolerated treatment well;Patient limited by lethargy Patient left: in chair;with call bell/phone within reach;with chair alarm set     Time: [id number] PT Time Calculation (min) (ACUTE ONLY): 30 min  Charges:  $Gait Training: 8-22 mins $Therapeutic Activity: 8-22 mins                    G CodesEliseo Gum:      Ezme Duch V Eufelia Veno 04/02/2016, 11:40 AM 04/02/2016  [place] BingKen Demarea Lorey, PT [phone]  (pager)

## 2016-04-03 DIAGNOSIS — G9340 Encephalopathy, unspecified: Secondary | ICD-10-CM

## 2016-04-03 DIAGNOSIS — F1410 Cocaine abuse, uncomplicated: Secondary | ICD-10-CM

## 2016-04-03 LAB — BASIC METABOLIC PANEL
Anion gap: 9 (ref 5–15)
BUN: 21 mg/dL — AB (ref 6–20)
CHLORIDE: 105 mmol/L (ref 101–111)
CO2: 26 mmol/L (ref 22–32)
CREATININE: 1.15 mg/dL (ref 0.61–1.24)
Calcium: 9.1 mg/dL (ref 8.9–10.3)
GFR calc Af Amer: >60 mL/min (ref >60)
GFR calc non Af Amer: >60 mL/min (ref >60)
Glucose, Bld: 110 mg/dL — ABNORMAL HIGH (ref 65–99)
POTASSIUM: 4.2 mmol/L (ref 3.5–5.1)
Sodium: 140 mmol/L (ref 135–145)

## 2016-04-03 LAB — MAGNESIUM: Magnesium: 1.9 mg/dL (ref 1.7–2.4)

## 2016-04-03 LAB — GLUCOSE, CAPILLARY: GLUCOSE-CAPILLARY: 120 mg/dL — AB (ref 65–99)

## 2016-04-03 MED ORDER — CHLORDIAZEPOXIDE HCL 5 MG PO CAPS: 25.0000 mg | ORAL_CAPSULE | Freq: Every day | ORAL | Status: DC

## 2016-04-03 MED ORDER — RISPERIDONE 0.5 MG PO TABS
0.5000 mg | ORAL_TABLET | Freq: Every day | ORAL | Status: DC
  Administered 2016-04-03 – 2016-04-09 (×7): 0.5 mg via ORAL
  Filled 2016-04-03 (×7): qty 1

## 2016-04-03 MED ORDER — CLONAZEPAM 1 MG PO TABS
1.0000 mg | ORAL_TABLET | Freq: Every day | ORAL | Status: DC
  Administered 2016-04-03: 1 mg via ORAL
  Filled 2016-04-03: qty 1

## 2016-04-03 MED ORDER — CLONAZEPAM 1 MG PO TABS: 1.0000 mg | ORAL_TABLET | Freq: Two times a day (BID) | ORAL | Status: DC | PRN

## 2016-04-03 MED ORDER — LORAZEPAM 2 MG/ML IJ SOLN: 1.0000 mg | Freq: Four times a day (QID) | INTRAMUSCULAR | Status: DC | PRN

## 2016-04-03 NOTE — Progress Notes (Signed)
PROGRESS NOTE        PATIENT DETAILS Name: Leonard HartshornRonald Forbes Age: 60 y.o. Sex: male Date of Birth: Feb 09, 1956 Admit Date: 03/25/2016 Admitting Physician Bobette Moavid Manuel Ortiz, MD PCP:No PCP Per Patient  Brief Narrative: Patient is a 60 y.o. male with history of alcohol/cocaine use-who initially presented to the ED for evaluation of mouth pain ongoing 3 days prior to this admission-in the emergency room he was then found to be agitated and confused. He was subsequently admitted for further evaluation and treatment.  Subjective: Sedated this am-had received IV Ativan earlier-again seen later this afternoon-much more awake/alert-brother at bedside  Assessment/Plan: Acute encephalopathy: Suspect related to alcohol and cocaine withdrawal. His mentation slowly improving-continues to have delirium requiring prn IV Ativan. Stop Librium-he is out of the window for withdrawal.Will start low dose Risperdal and use prn Klonopin. Note seen by psychiatry again on 12/27 he does not have decisional capacity with respect to be discharged to group home.   Acute kidney injury: Resolved, likely mild prerenal azotemia due to dehydration.  Hypokalemia/hypomagnesemia: Repleted. Recheck periodically.  Paroxysmal atrial fibrillation: Back in sinus rhythm. Continue Cardizem, not a anticoagulation candidate given history of alcoholism/cocaine use/prior history of hemorrhagic CVA.  Hypertension: Controlled-infact on the soft side-will stop Lisinopril but continue Cardizem,  hydralazine.  Subacute/chronic subdural hematoma: Per CT report-this appears stable and chronic.  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 CT head on 12/22 showed no hydrocephalus-VP shunt in stable position.  Prominent ascending thoracic aorta: Measures 44 cm. Recommend annual imaging with CT  angiogram or MRI. This will be deferred to his outpatient M.D.  Malnutrition of moderate degree: Continue supplements  DVT Prophylaxis: SCD's  Code Status: Full code   Family Communication: Brother at bedside  Disposition Plan: Remain inpatient-but will plan on SNF on discharge in the next few days  Antimicrobial agents: None  Procedures: TTE 12/20>>EF 65-70%, grade 1 diastolic dysfunction  CONSULTS:  psychiatry  Time spent: 25- minutes-Greater than 50% of this time was spent in counseling, explanation of diagnosis, planning of further management, and coordination of care.  MEDICATIONS: Anti-infectives    None      Scheduled Meds: . chlordiazePOXIDE  25 mg Oral BID  . diltiazem  120 mg Oral Daily  . feeding supplement (ENSURE ENLIVE)  237 mL Oral BID BM  . hydrALAZINE  25 mg Oral Q8H  . lisinopril  20 mg Oral Daily  . sodium chloride flush  3 mL Intravenous Q12H   Continuous Infusions: PRN Meds:.hydrALAZINE, LORazepam, ondansetron **OR** ondansetron (ZOFRAN) IV   PHYSICAL EXAM: Vital signs: Vitals:   04/03/16 0552 04/03/16 0759 04/03/16 0930 04/03/16 1400  BP: (!) 136/95 104/75 (!) 132/91 107/76  Pulse: 69 81 80 82  Resp: 18  18   Temp: 98.2 F (36.8 C)  98.7 F (37.1 C)   TempSrc: Oral  Oral   SpO2: 96%  92%   Weight:      Height:       Filed Weights   03/31/16 2213 04/01/16 2316 04/02/16 1959  Weight: 57.6 kg (126 lb 15.8 oz) 55.6 kg (122 lb 9.2 oz) 56.3 kg (124 lb 1.9 oz)   Body mass index is 18.33 kg/m.   General appearance :Awake, alert, chronically ill-appearing-but follows commands. Eyes:,  pupils equally reactive to light and accomodation,no scleral icterus. HEENT: Atraumatic and Normocephalic Neck: supple, no JVD. No cervical lymphadenopathy.  Resp:Good air entry bilaterally, no added sounds  CVS: S1 S2 regular GI: Bowel sounds present, Non tender and not distended with no gaurding, rigidity or rebound.No organomegaly Extremities:  B/L Lower Ext shows no edema, both legs are warm to touch Neurology:  speech clear,Non focal-but with significant generalized weakness Musculoskeletal:No digital cyanosis Skin:No Rash, warm and dry Wounds:N/A  I have personally reviewed following labs and imaging studies  LABORATORY DATA: CBC:  Recent Labs Lab 03/29/16 0148 03/30/16 0232 03/31/16 0523 04/01/16 0554 04/02/16 0559  WBC 7.8 7.5 7.8 11.5* 10.0  HGB 14.4 13.7 13.4 12.9* 13.3  HCT 41.5 39.5 40.0 36.9* 38.4*  MCV 84.7 85.5 86.6 84.8 84.6  PLT PLATELET CLUMPS NOTED ON SMEAR, COUNT APPEARS DECREASED 368 394 384 393    Basic Metabolic Panel:  Recent Labs Lab 03/30/16 0232 03/31/16 0523 04/01/16 0554 04/02/16 0559 04/03/16 0543  NA 140 140 140 140 140  K 3.4* 3.8 3.4* 3.9 4.2  CL 111 109 106 105 105  CO2 20* 26 24 26 26   GLUCOSE 89 105* 100* 92 110*  BUN 9 9 8 14  21*  CREATININE 1.00 1.16 1.17 1.20 1.15  CALCIUM 8.8* 8.7* 9.2 8.9 9.1  MG 1.6* 1.8 1.6* 2.0 1.9    GFR: Estimated Creatinine Clearance: 53.7 mL/min (by C-G formula based on SCr of 1.15 mg/dL).  Liver Function Tests:  Recent Labs Lab 03/30/16 0232  AST 14*  ALT 11*  ALKPHOS 61  BILITOT 0.9  PROT 6.4*  ALBUMIN 3.0*   No results for input(s): LIPASE, AMYLASE in the last 168 hours. No results for input(s): AMMONIA in the last 168 hours.  Coagulation Profile: No results for input(s): INR, PROTIME in the last 168 hours.  Cardiac Enzymes: No results for input(s): CKTOTAL, CKMB, CKMBINDEX, TROPONINI in the last 168 hours.  BNP (last 3 results) No results for input(s): PROBNP in the last 8760 hours.  HbA1C: No results for input(s): HGBA1C in the last 72 hours.  CBG:  Recent Labs Lab 03/30/16 1734 04/01/16 0805 04/02/16 0759 04/02/16 1140 04/03/16 0735  GLUCAP 118* 90 94 111* 120*    Lipid Profile: No results for input(s): CHOL, HDL, LDLCALC, TRIG, CHOLHDL, LDLDIRECT in the last 72 hours.  Thyroid Function Tests: No  results for input(s): TSH, T4TOTAL, FREET4, T3FREE, THYROIDAB in the last 72 hours.  Anemia Panel: No results for input(s): VITAMINB12, FOLATE, FERRITIN, TIBC, IRON, RETICCTPCT in the last 72 hours.  Urine analysis:    Component Value Date/Time   COLORURINE YELLOW 03/25/2016 1555   APPEARANCEUR CLEAR 03/25/2016 1555   LABSPEC 1.014 03/25/2016 1555   PHURINE 5.0 03/25/2016 1555   GLUCOSEU NEGATIVE 03/25/2016 1555   HGBUR NEGATIVE 03/25/2016 1555   BILIRUBINUR NEGATIVE 03/25/2016 1555   KETONESUR 20 (A) 03/25/2016 1555   PROTEINUR 30 (A) 03/25/2016 1555   UROBILINOGEN 0.2 11/05/2010 2248   NITRITE NEGATIVE 03/25/2016 1555   LEUKOCYTESUR NEGATIVE 03/25/2016 1555    Sepsis Labs: Lactic Acid, Venous    Component Value Date/Time   LATICACIDVEN 1.1 03/27/2016 1901    MICROBIOLOGY: Recent Results (from the past 240 hour(s))  MRSA PCR Screening     Status: None   Collection Time: 03/26/16  2:04 AM  Result Value Ref Range Status   MRSA by PCR NEGATIVE NEGATIVE Final    Comment:        The GeneXpert MRSA  Assay (FDA approved for NASAL specimens only), is one component of a comprehensive MRSA colonization surveillance program. It is not intended to diagnose MRSA infection nor to guide or monitor treatment for MRSA infections.   Culture, blood (routine x 2)     Status: None   Collection Time: 03/26/16 10:20 AM  Result Value Ref Range Status   Specimen Description BLOOD BLOOD LEFT ARM  Final   Special Requests IN PEDIATRIC BOTTLE 2CC  Final   Culture NO GROWTH 5 DAYS  Final   Report Status 03/31/2016 FINAL  Final  Culture, blood (routine x 2)     Status: None   Collection Time: 03/26/16  1:30 PM  Result Value Ref Range Status   Specimen Description BLOOD BLOOD LEFT ARM  Final   Special Requests IN PEDIATRIC BOTTLE 1.0 CC  Final   Culture NO GROWTH 5 DAYS  Final   Report Status 03/31/2016 FINAL  Final    RADIOLOGY STUDIES/RESULTS: Dg Chest 2 View  Result Date:  03/25/2016 CLINICAL DATA:  Syncope.  Ibuprofen overdose. EXAM: CHEST  2 VIEW COMPARISON:  11/06/2010. FINDINGS: Normal sized heart. Tortuous aorta. Clear lungs with normal vascularity. Right ventriculoperitoneal shunt tube. Unremarkable bones. IMPRESSION: No acute abnormality. Electronically Signed   By: Beckie Salts M.D.   On: 03/25/2016 16:28   Ct Head Wo Contrast  Result Date: 03/28/2016 CLINICAL DATA:  Initial evaluation for acute altered mental status, fall out of that. EXAM: CT HEAD WITHOUT CONTRAST TECHNIQUE: Contiguous axial images were obtained from the base of the skull through the vertex without intravenous contrast. COMPARISON:  Comparison made with prior CT from 03/25/2016. FINDINGS: Brain: Cerebral atrophy with chronic microvascular ischemic disease noted, stable. Right parietal approach VP shunt catheter remains in place with tip at the left basal ganglia, stable. Ventricular size is unchanged without evidence for hydrocephalus. Streak artifact from surgical clips from prior aneurysm repair again seen. No evidence for acute intracranial hemorrhage. No findings to suggest acute large vessel territory infarct. No mass lesion, midline shift, or mass effect. There is a left holo hemispheric subdural collection overlying the left cerebral hemisphere. This measures up to 4 mm in maximal thickness. Collection is largely I so to low-density as compared to the adjacent brain, compatible with a sub acute collection. No appreciable there is trace 3 mm left-to-right shift. Upon review of prior CT of from 03/25/2016, this collection appears to have been present on previous exam, although extremely difficult to visualize on that study, as this collection was only trace in size on that exam. Trace left-to-right shift is not significantly changed. No hyperdense component within this collection to suggest that this has acutely bled with recent fall. This collection is suspected to at least in part be related to  the patient's shunt. No other extra-axial fluid collection. Vascular: No hyperdense vessel, although evaluation limited by streak artifact from aneurysm clip. Skull: Scalp soft tissues demonstrate no acute abnormality. Calvarium unchanged. Postoperative changes from prior right pterional craniotomy again seen. Sinuses/Orbits: Globes and orbital soft tissues within normal limits. Scattered mucosal thickening within the ethmoidal air cells and right maxillary sinus. Paranasal sinuses are otherwise clear. No mastoid effusion. IMPRESSION: 1. Left holo hemispheric subdural collection measuring up to 4 mm as above, subacute to chronic in appearance. Although not mentioned previously, this collection was likely present on previous exam from 03/25/2016, although extremely difficult to visualize on that study as this was only trace in size at that time. The apparent interval increase in size  suspected to be related to shunting. No internal hyperdensity to suggest that this has acutely bled with recent fall. 2. Trace 2-3 mm of left-to-right shift, relatively stable. 3. No other acute intracranial process. 4. 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 5. Right parietal approach VP shunt in stable position. No hydrocephalus. Electronically Signed   By: Rise MuBenjamin  McClintock M.D.   On: 03/28/2016 01:59   Ct Head Wo Contrast  Result Date: 03/25/2016 CLINICAL DATA:  60 year old male with a history of altered mental status EXAM: CT HEAD WITHOUT CONTRAST TECHNIQUE: Contiguous axial images were obtained from the base of the skull through the vertex without intravenous contrast. COMPARISON:  07/31/2014, 11/06/2010 FINDINGS: Brain: No acute intracranial hemorrhage. No midline shift or mass effect. Unremarkable appearance of the ventricular system. 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 Skull: Surgical changes of  right pterional craniotomy for surgical clipping of aneurysm. No acute bony abnormality. Right posterior parietal approach ventriculostomy terminates to the left of midline. Sinuses/Orbits: No significant sinus disease. Other: None IMPRESSION: No CT evidence of acute intracranial abnormality. 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 Signed, Yvone NeuJaime S. Loreta AveWagner, DO [facility] Electronically Signed   By: Gilmer MorJaime  Wagner D.O.   On: 03/25/2016 20:30   Ct Chest Wo Contrast  Result Date: 03/26/2016 CLINICAL DATA:  Shortness of breath. Concern for aspiration pneumonitis EXAM: CT CHEST WITHOUT CONTRAST TECHNIQUE: Multidetector CT imaging of the chest was performed following the standard protocol without IV contrast. COMPARISON:  Chest radiograph March 25, 2016 FINDINGS: Cardiovascular: The ascending thoracic aortic diameter is measured at 4.0 x 4.0 cm, measured on axial slice 59 series 201. The thoracic aorta is somewhat ectatic. There are scattered foci of atherosclerotic calcification in the aorta. The visualized great vessels appear unremarkable on this noncontrast enhanced study. There is slight pericardial thickening without pericardial effusion evident. Mediastinum/Nodes: Thyroid appears unremarkable. There is no evident thoracic adenopathy. Lungs/Pleura: There is mild scarring in the lung apices with slight bullous disease in the extreme apices bilaterally. There are scattered small bullae throughout the lungs bilaterally. There is bibasilar atelectatic change, slightly more on the right than on the left. There is no frank edema or consolidation. There is no pleural effusion or pleural thickening evident. Upper Abdomen: Visualized upper abdominal structures appear unremarkable. Musculoskeletal:  There are no blastic or lytic bone lesions. IMPRESSION: Bibasilar atelectatic change, slightly more on the right than on the left, likely due to dependent positioning. No edema or consolidation. No findings felt to represent aspiration pneumonitis. No adenopathy. Prominence of the ascending thoracic aorta with a measured diameter 4.0 x 4.0 cm. Recommend annual imaging followup by CTA or MRA. This recommendation follows 2010 ACCF/AHA/AATS/ACR/ASA/SCA/SCAI/SIR/STS/SVM Guidelines for the Diagnosis and Management of Patients with Thoracic Aortic Disease. Circulation. 2010; 121: Z610-R604: [id number]. Thoracic aorta somewhat ectatic with scattered foci of atherosclerotic calcification. Borderline pericardial thickening without pericardial effusion. Electronically Signed   By: Bretta BangWilliam  Woodruff III M.D.   On: 03/26/2016 10:48     LOS: 9 days   Jeoffrey MassedGHIMIRE,Tania Perrott, MD  [facility] Pager:[phone]  If 7PM-7AM, please contact night-coverage [email or web address] Password [email or web address] 04/03/2016, 3:07 PM

## 2016-04-04 ENCOUNTER — Inpatient Hospital Stay (HOSPITAL_COMMUNITY): Payer: Medicare Other

## 2016-04-04 DIAGNOSIS — R609 Edema, unspecified: Secondary | ICD-10-CM

## 2016-04-04 LAB — GLUCOSE, CAPILLARY
GLUCOSE-CAPILLARY: 115 mg/dL — AB (ref 65–99)
Glucose-Capillary: 94 mg/dL (ref 65–99)

## 2016-04-04 LAB — CBC
HCT: 38.6 % — ABNORMAL LOW (ref 39.0–52.0)
Hemoglobin: 12.9 g/dL — ABNORMAL LOW (ref 13.0–17.0)
MCH: 29.4 pg (ref 26.0–34.0)
MCHC: 33.4 g/dL (ref 30.0–36.0)
MCV: 87.9 fL (ref 78.0–100.0)
Platelets: 393 10*3/uL (ref 150–400)
RBC: 4.39 MIL/uL (ref 4.22–5.81)
RDW: 15.3 % (ref 11.5–15.5)
WBC: 6.9 10*3/uL (ref 4.0–10.5)

## 2016-04-04 LAB — BASIC METABOLIC PANEL
Anion gap: 9 (ref 5–15)
BUN: 21 mg/dL — ABNORMAL HIGH (ref 6–20)
CO2: 22 mmol/L (ref 22–32)
Calcium: 8.4 mg/dL — ABNORMAL LOW (ref 8.9–10.3)
Chloride: 111 mmol/L (ref 101–111)
Creatinine, Ser: 1.1 mg/dL (ref 0.61–1.24)
GFR calc Af Amer: >60 mL/min (ref >60)
GFR calc non Af Amer: >60 mL/min (ref >60)
Glucose, Bld: 91 mg/dL (ref 65–99)
Potassium: 4 mmol/L (ref 3.5–5.1)
Sodium: 142 mmol/L (ref 135–145)

## 2016-04-04 MED ORDER — SODIUM CHLORIDE 0.9 % IV SOLN: INTRAVENOUS | Status: DC

## 2016-04-04 MED ORDER — ENOXAPARIN SODIUM 40 MG/0.4ML ~~LOC~~ SOLN
40.0000 mg | SUBCUTANEOUS | Status: DC
Start: 2016-04-04 — End: 2016-04-05
  Administered 2016-04-04: 40 mg via SUBCUTANEOUS
  Filled 2016-04-04 (×2): qty 0.4

## 2016-04-04 MED ORDER — LORAZEPAM 2 MG/ML IJ SOLN: 0.5000 mg | Freq: Four times a day (QID) | INTRAMUSCULAR | Status: DC | PRN

## 2016-04-04 MED ORDER — SODIUM CHLORIDE 0.9 % IV BOLUS (SEPSIS)
500.0000 mL | Freq: Once | INTRAVENOUS | Status: AC
  Administered 2016-04-04: 500 mL via INTRAVENOUS

## 2016-04-04 MED ORDER — GUAIFENESIN 100 MG/5ML PO SOLN
5.0000 mL | ORAL | Status: DC | PRN
  Administered 2016-04-04 – 2016-04-05 (×3): 100 mg via ORAL
  Filled 2016-04-04 (×6): qty 5

## 2016-04-04 MED ORDER — SODIUM CHLORIDE 0.9 % IV SOLN
INTRAVENOUS | Status: AC
  Administered 2016-04-04: 10:00:00 via INTRAVENOUS

## 2016-04-04 MED ORDER — CLONAZEPAM 0.5 MG PO TABS: 0.5000 mg | ORAL_TABLET | Freq: Every day | ORAL | Status: DC

## 2016-04-04 MED ORDER — CLONAZEPAM 0.5 MG PO TABS
0.5000 mg | ORAL_TABLET | Freq: Every evening | ORAL | Status: DC | PRN
  Administered 2016-04-05: 0.5 mg via ORAL
  Filled 2016-04-04: qty 1

## 2016-04-04 MED ORDER — DILTIAZEM HCL ER COATED BEADS 180 MG PO CP24
180.0000 mg | ORAL_CAPSULE | Freq: Every day | ORAL | Status: DC
  Administered 2016-04-05: 180 mg via ORAL
  Filled 2016-04-04 (×2): qty 1

## 2016-04-04 NOTE — Progress Notes (Signed)
PROGRESS NOTE        PATIENT DETAILS Name: Leonard HartshornRonald Forbes Age: 60 y.o. Sex: male Date of Birth: 07-27-1955 Admit Date: 03/25/2016 Admitting Physician Bobette Moavid Manuel Ortiz, MD PCP:No PCP Per Patient  Brief Narrative: Patient is a 60 y.o. male with history of alcohol/cocaine use-who initially presented to the ED for evaluation of mouth pain ongoing 3 days prior to this admission-in the emergency room he was then found to be agitated and confused. He was subsequently admitted for further evaluation and treatment.  Subjective: Significantly less agitated and confused this morning. Received Klonopin and Risperdal last night-per RN he slept most of the night. Slightly lethargic but easily arousable this morning. Note yesterday-patient was very agitated and was subsequently sedated with IV Ativan. Developed mild sinus tachycardia this morning that has since resolved.  Assessment/Plan: Acute encephalopathy: Suspect related to alcohol and cocaine withdrawal. His mentation is slowly improving-continues to have delirium requiring prn IV Ativan. Have stopped Librium-as he is out of the window for withdrawal. Given intermittent delirium/agitation-have started  low dose Risperdal/Klonopin qhs-did not have any agitation last night-and did not require IV Ativan. Follow and optimize accordingly. Note seen by psychiatry again on 12/27 he does not have decisional capacity with respect to be discharged to group home.   Acute kidney injury: Resolved, likely mild prerenal azotemia due to dehydration.  Hypokalemia/hypomagnesemia: Repleted. Recheck periodically.  Paroxysmal atrial fibrillation: Back in sinus rhythm. Continue Cardizem, not a anticoagulation candidate given history of alcoholism/cocaine use/prior history of hemorrhagic CVA.  Mild Sinus Tachycardia: Observed earlier on 12/29-reviewed telemetry-heart rate mostly in the 90s during my review. Was coughing-but chest x-ray negative  for pneumonia. Lower extremity Dopplers negative for DVT.  Hypertension: Controlled-infact on the soft side-will stop Lisinopril but continue Cardizem,  hydralazine.  Subacute/chronic subdural hematoma: Per CT report-this appears stable and chronic.  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 CT head on 12/22 showed no hydrocephalus-VP shunt in stable position.  Prominent ascending thoracic aorta: Measures 44 cm. Recommend annual imaging with CT angiogram or MRI. This will be deferred to his outpatient M.D.  Malnutrition of moderate degree: Continue supplements  DVT Prophylaxis: SCD's  Code Status: Full code   Family Communication: None at bedside  Disposition Plan: Remain inpatient-but will plan on SNF on discharge in the next few days  Antimicrobial agents: None  Procedures: TTE 12/20>>EF 65-70%, grade 1 diastolic dysfunction  CONSULTS:  psychiatry  Time spent: 25- minutes-Greater than 50% of this time was spent in counseling, explanation of diagnosis, planning of further management, and coordination of care.  MEDICATIONS: Anti-infectives    None      Scheduled Meds: . clonazePAM  1 mg Oral QHS  . [START ON 04/05/2016] diltiazem  180 mg Oral Daily  . enoxaparin (LOVENOX) injection  40 mg Subcutaneous Q24H  . feeding supplement (ENSURE ENLIVE)  237 mL Oral BID BM  . risperiDONE  0.5 mg Oral QHS   Continuous Infusions: . sodium chloride 75 mL/hr at 04/04/16 1028   PRN Meds:.clonazePAM, hydrALAZINE, LORazepam, ondansetron **OR** ondansetron (ZOFRAN) IV   PHYSICAL EXAM: Vital signs: Vitals:   04/03/16 2034 04/04/16 0326 04/04/16 0531 04/04/16 0700  BP: (!) 114/95 (!) 124/91  (!) 129/104  Pulse: 76 (!) 119  (!) 122  Resp: 18 15  16   Temp: 98.5 F (36.9 C)  98.8 F (37.1 C)   TempSrc:      SpO2: 100% 94%  97%  Weight:        Height:       Filed Weights   03/31/16 2213 04/01/16 2316 04/02/16 1959  Weight: 57.6 kg (126 lb 15.8 oz) 55.6 kg (122 lb 9.2 oz) 56.3 kg (124 lb 1.9 oz)   Body mass index is 18.33 kg/m.   General appearance :Awake, alert, chronically ill-appearing follows commands. Eyes:, pupils equally reactive to light and accomodation,no scleral icterus. HEENT: Atraumatic and Normocephalic Neck: supple, no JVD. No cervical lymphadenopathy.  Resp:Good air entry bilaterally, no added sounds  CVS: S1 S2 regular GI: Bowel sounds present, Non tender and not distended with no gaurding, rigidity or rebound.No organomegaly Extremities: B/L Lower Ext shows no edema, both legs are warm to touch Neurology:  speech clear,Non focal-but with significant generalized weakness Musculoskeletal:No digital cyanosis Skin:No Rash, warm and dry Wounds:N/A  I have personally reviewed following labs and imaging studies  LABORATORY DATA: CBC:  Recent Labs Lab 03/30/16 0232 03/31/16 0523 04/01/16 0554 04/02/16 0559 04/04/16 0756  WBC 7.5 7.8 11.5* 10.0 6.9  HGB 13.7 13.4 12.9* 13.3 12.9*  HCT 39.5 40.0 36.9* 38.4* 38.6*  MCV 85.5 86.6 84.8 84.6 87.9  PLT 368 394 384 393 393    Basic Metabolic Panel:  Recent Labs Lab 03/30/16 0232 03/31/16 0523 04/01/16 0554 04/02/16 0559 04/03/16 0543 04/04/16 0756  NA 140 140 140 140 140 142  K 3.4* 3.8 3.4* 3.9 4.2 4.0  CL 111 109 106 105 105 111  CO2 20* 26 24 26 26 22   GLUCOSE 89 105* 100* 92 110* 91  BUN 9 9 8 14  21* 21*  CREATININE 1.00 1.16 1.17 1.20 1.15 1.10  CALCIUM 8.8* 8.7* 9.2 8.9 9.1 8.4*  MG 1.6* 1.8 1.6* 2.0 1.9  --     GFR: Estimated Creatinine Clearance: 56.2 mL/min (by C-G formula based on SCr of 1.1 mg/dL).  Liver Function Tests:  Recent Labs Lab 03/30/16 0232  AST 14*  ALT 11*  ALKPHOS 61  BILITOT 0.9  PROT 6.4*  ALBUMIN 3.0*   No results for input(s): LIPASE, AMYLASE in the last 168 hours. No results for input(s): AMMONIA  in the last 168 hours.  Coagulation Profile: No results for input(s): INR, PROTIME in the last 168 hours.  Cardiac Enzymes: No results for input(s): CKTOTAL, CKMB, CKMBINDEX, TROPONINI in the last 168 hours.  BNP (last 3 results) No results for input(s): PROBNP in the last 8760 hours.  HbA1C: No results for input(s): HGBA1C in the last 72 hours.  CBG:  Recent Labs Lab 04/02/16 0759 04/02/16 1140 04/03/16 0735 04/04/16 0749 04/04/16 1243  GLUCAP 94 111* 120* 94 115*    Lipid Profile: No results for input(s): CHOL, HDL, LDLCALC, TRIG, CHOLHDL, LDLDIRECT in the last 72 hours.  Thyroid Function Tests: No results for input(s): TSH, T4TOTAL, FREET4, T3FREE, THYROIDAB in the last 72 hours.  Anemia Panel: No results for input(s): VITAMINB12, FOLATE, FERRITIN, TIBC, IRON, RETICCTPCT in the last 72 hours.  Urine analysis:    Component Value Date/Time   COLORURINE YELLOW 03/25/2016 1555   APPEARANCEUR CLEAR 03/25/2016 1555   LABSPEC 1.014 03/25/2016 1555   PHURINE 5.0 03/25/2016 1555   GLUCOSEU NEGATIVE 03/25/2016 1555   HGBUR NEGATIVE 03/25/2016 1555   BILIRUBINUR NEGATIVE 03/25/2016 1555   KETONESUR 20 (A) 03/25/2016 1555   PROTEINUR  30 (A) 03/25/2016 1555   UROBILINOGEN 0.2 11/05/2010 2248   NITRITE NEGATIVE 03/25/2016 1555   LEUKOCYTESUR NEGATIVE 03/25/2016 1555    Sepsis Labs: Lactic Acid, Venous    Component Value Date/Time   LATICACIDVEN 1.1 03/27/2016 1901    MICROBIOLOGY: Recent Results (from the past 240 hour(s))  MRSA PCR Screening     Status: None   Collection Time: 03/26/16  2:04 AM  Result Value Ref Range Status   MRSA by PCR NEGATIVE NEGATIVE Final    Comment:        The GeneXpert MRSA Assay (FDA approved for NASAL specimens only), is one component of a comprehensive MRSA colonization surveillance program. It is not intended to diagnose MRSA infection nor to guide or monitor treatment for MRSA infections.   Culture, blood (routine x 2)      Status: None   Collection Time: 03/26/16 10:20 AM  Result Value Ref Range Status   Specimen Description BLOOD BLOOD LEFT ARM  Final   Special Requests IN PEDIATRIC BOTTLE 2CC  Final   Culture NO GROWTH 5 DAYS  Final   Report Status 03/31/2016 FINAL  Final  Culture, blood (routine x 2)     Status: None   Collection Time: 03/26/16  1:30 PM  Result Value Ref Range Status   Specimen Description BLOOD BLOOD LEFT ARM  Final   Special Requests IN PEDIATRIC BOTTLE 1.0 CC  Final   Culture NO GROWTH 5 DAYS  Final   Report Status 03/31/2016 FINAL  Final    RADIOLOGY STUDIES/RESULTS: Dg Chest 2 View  Result Date: 03/25/2016 CLINICAL DATA:  Syncope.  Ibuprofen overdose. EXAM: CHEST  2 VIEW COMPARISON:  11/06/2010. FINDINGS: Normal sized heart. Tortuous aorta. Clear lungs with normal vascularity. Right ventriculoperitoneal shunt tube. Unremarkable bones. IMPRESSION: No acute abnormality. Electronically Signed   By: Beckie Salts M.D.   On: 03/25/2016 16:28   Ct Head Wo Contrast  Result Date: 03/28/2016 CLINICAL DATA:  Initial evaluation for acute altered mental status, fall out of that. EXAM: CT HEAD WITHOUT CONTRAST TECHNIQUE: Contiguous axial images were obtained from the base of the skull through the vertex without intravenous contrast. COMPARISON:  Comparison made with prior CT from 03/25/2016. FINDINGS: Brain: Cerebral atrophy with chronic microvascular ischemic disease noted, stable. Right parietal approach VP shunt catheter remains in place with tip at the left basal ganglia, stable. Ventricular size is unchanged without evidence for hydrocephalus. Streak artifact from surgical clips from prior aneurysm repair again seen. No evidence for acute intracranial hemorrhage. No findings to suggest acute large vessel territory infarct. No mass lesion, midline shift, or mass effect. There is a left holo hemispheric subdural collection overlying the left cerebral hemisphere. This measures up to 4 mm in  maximal thickness. Collection is largely I so to low-density as compared to the adjacent brain, compatible with a sub acute collection. No appreciable there is trace 3 mm left-to-right shift. Upon review of prior CT of from 03/25/2016, this collection appears to have been present on previous exam, although extremely difficult to visualize on that study, as this collection was only trace in size on that exam. Trace left-to-right shift is not significantly changed. No hyperdense component within this collection to suggest that this has acutely bled with recent fall. This collection is suspected to at least in part be related to the patient's shunt. No other extra-axial fluid collection. Vascular: No hyperdense vessel, although evaluation limited by streak artifact from aneurysm clip. Skull: Scalp soft tissues demonstrate no  acute abnormality. Calvarium unchanged. Postoperative changes from prior right pterional craniotomy again seen. Sinuses/Orbits: Globes and orbital soft tissues within normal limits. Scattered mucosal thickening within the ethmoidal air cells and right maxillary sinus. Paranasal sinuses are otherwise clear. No mastoid effusion. IMPRESSION: 1. Left holo hemispheric subdural collection measuring up to 4 mm as above, subacute to chronic in appearance. Although not mentioned previously, this collection was likely present on previous exam from 03/25/2016, although extremely difficult to visualize on that study as this was only trace in size at that time. The apparent interval increase in size suspected to be related to shunting. No internal hyperdensity to suggest that this has acutely bled with recent fall. 2. Trace 2-3 mm of left-to-right shift, relatively stable. 3. No other acute intracranial process. 4. 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 5. Right parietal approach VP shunt in stable position. No hydrocephalus.  Electronically Signed   By: Rise MuBenjamin  McClintock M.D.   On: 03/28/2016 01:59   Ct Head Wo Contrast  Result Date: 03/25/2016 CLINICAL DATA:  60 year old male with a history of altered mental status EXAM: CT HEAD WITHOUT CONTRAST TECHNIQUE: Contiguous axial images were obtained from the base of the skull through the vertex without intravenous contrast. COMPARISON:  07/31/2014, 11/06/2010 FINDINGS: Brain: No acute intracranial hemorrhage. No midline shift or mass effect. Unremarkable appearance of the ventricular system. 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 Skull: Surgical changes of right pterional craniotomy for surgical clipping of aneurysm. No acute bony abnormality. Right posterior parietal approach ventriculostomy terminates to the left of midline. Sinuses/Orbits: No significant sinus disease. Other: None IMPRESSION: No CT evidence of acute intracranial abnormality. 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 Signed, Yvone NeuJaime S. Loreta AveWagner, DO [facility] Electronically Signed   By: Gilmer MorJaime  Wagner D.O.   On: 03/25/2016 20:30   Ct Chest Wo Contrast  Result Date: 03/26/2016 CLINICAL DATA:  Shortness of breath. Concern for aspiration pneumonitis EXAM: CT CHEST WITHOUT CONTRAST TECHNIQUE: Multidetector CT imaging of the chest was performed following the standard protocol without IV contrast. COMPARISON:  Chest radiograph March 25, 2016 FINDINGS: Cardiovascular: The ascending thoracic aortic diameter is measured at 4.0 x 4.0 cm, measured on axial slice 59 series 201. The thoracic aorta is somewhat ectatic. There are scattered foci of atherosclerotic calcification in the aorta. The visualized great vessels appear unremarkable on this noncontrast enhanced  study. There is slight pericardial thickening without pericardial effusion evident. Mediastinum/Nodes: Thyroid appears unremarkable. There is no evident thoracic adenopathy. Lungs/Pleura: There is mild scarring in the lung apices with slight bullous disease in the extreme apices bilaterally. There are scattered small bullae throughout the lungs bilaterally. There is bibasilar atelectatic change, slightly more on the right than on the left. There is no frank edema or consolidation. There is no pleural effusion or pleural thickening evident. Upper Abdomen: Visualized upper abdominal structures appear unremarkable. Musculoskeletal: There are no blastic or lytic bone lesions. IMPRESSION: Bibasilar atelectatic change, slightly more on the right than on the left, likely due to dependent positioning. No edema or consolidation. No findings felt to represent aspiration pneumonitis. No adenopathy. Prominence of the ascending thoracic aorta with a measured diameter 4.0 x 4.0 cm. Recommend annual imaging followup by CTA or MRA. This recommendation follows 2010 ACCF/AHA/AATS/ACR/ASA/SCA/SCAI/SIR/STS/SVM Guidelines for the  Diagnosis and Management of Patients with Thoracic Aortic Disease. Circulation. 2010; 121: W098-J191. Thoracic aorta somewhat ectatic with scattered foci of atherosclerotic calcification. Borderline pericardial thickening without pericardial effusion. Electronically Signed   By: Bretta Bang III M.D.   On: 03/26/2016 10:48   Dg Chest Port 1 View  Result Date: 04/04/2016 CLINICAL DATA:  Cough beginning this morning. EXAM: PORTABLE CHEST 1 VIEW COMPARISON:  03/25/2016 FINDINGS: Cardiac silhouette is normal in size. The aorta is uncoiled and tortuous. No mediastinal or hilar masses. No evidence of adenopathy. Clear lungs.  Lungs are mildly hyperexpanded. No pleural effusion or pneumothorax. Skeletal structures are intact. Right anterior chest wall ventriculostomy catheter is intact. IMPRESSION: No acute  cardiopulmonary disease. Electronically Signed   By: Amie Portland M.D.   On: 04/04/2016 10:32     LOS: 10 days   Jeoffrey Massed, MD  [facility] Pager:[phone]  If 7PM-7AM, please contact night-coverage [email or web address] Password [email or web address] 04/04/2016, 2:23 PM

## 2016-04-04 NOTE — Progress Notes (Signed)
Notified of HR in the 120s by NT. Observed pt telemetry and HR was sustaining in the 120s at Sinus Tachycardia. MD alerted. bolus given with another running as there was no improvement upon administering the first bolus. Pt resting comfortably. Will monitor HR and alert MD when second bolus has been completed.

## 2016-04-04 NOTE — Progress Notes (Signed)
Physical Therapy Treatment Patient Details Name: Leonard HartshornRonald Forbes MRN: [id number] DOB: [birth year] Today's Date: 04/04/2016    History of Present Illness Pt adm with syncope. Pt with ?sepsis and now with ETOH withdrawal. Pt with fall on 12/21 with CT showing small subacute SDH present on admission. PMH - polysubstance abuse. aneurysm, craniotomy, vp shunt, HTN, CVA    PT Comments    Patient very lethargic today, difficult to awaken.  Only able to sit EOB with assist to maintain balance.  Agree with need for SNF at d/c for continued therapy.  Follow Up Recommendations  SNF     Equipment Recommendations  Other (comment) (TBD)    Recommendations for Other Services       Precautions / Restrictions Precautions Precautions: Fall Precaution Comments: etoh withdrawal Restrictions Weight Bearing Restrictions: No    Mobility  Bed Mobility Overal bed mobility: Needs Assistance Bed Mobility: Rolling;Sidelying to Sit;Sit to Supine Rolling: Mod assist Sidelying to sit: Max assist;+2 for physical assistance   Sit to supine: Mod assist;+2 for physical assistance   General bed mobility comments: Verbal and tactile stimuli to awaken patient - very lethargic.  Cues to move to sitting with minimal response.  Required +2 max assist to move patient to sitting position.  Patient awakening slowly.  Required mod assist initially for balance in sitting.  Improved to min guard with increased time.  Patient sat EOB x5 minutes.  Returned to supine with +2 mod assist.  Transfers                 General transfer comment: NT due to lethargy  Ambulation/Gait                 Stairs            Wheelchair Mobility    Modified Rankin (Stroke Patients Only)       Balance Overall balance assessment: Needs assistance Sitting-balance support: Bilateral upper extremity supported;Feet supported Sitting balance-Leahy Scale: Poor Sitting balance - Comments: Requiring UE support and  assist to maintain sitting balance.   Postural control: Other (comment) (Anterior lean)                          Cognition Arousal/Alertness: Lethargic (Slow to awaken) Behavior During Therapy: Flat affect Overall Cognitive Status: Impaired/Different from baseline Area of Impairment: Orientation;Attention;Following commands;Safety/judgement;Problem solving Orientation Level: Disoriented to;Time;Situation Current Attention Level: Sustained   Following Commands: Follows one step commands with increased time Safety/Judgement: Decreased awareness of safety;Decreased awareness of deficits   Problem Solving: Slow processing      Exercises      General Comments        Pertinent Vitals/Pain Pain Assessment: No/denies pain    Home Living                      Prior Function            PT Goals (current goals can now be found in the care plan section) Acute Rehab PT Goals Patient Stated Goal: want to go home Progress towards PT goals: Not progressing toward goals - comment (Very lethargic today)    Frequency    Min 3X/week      PT Plan Current plan remains appropriate    Co-evaluation             End of Session   Activity Tolerance: Patient limited by lethargy Patient left: in bed;with call bell/phone within reach (rails up)  Time: [id number] PT Time Calculation (min) (ACUTE ONLY): 10 min  Charges:  $Therapeutic Activity: 8-22 mins                    G Codes:      Vena AustriaSusan H Imoni Kohen 04/04/2016, 5:46 PM Durenda HurtSusan H. Renaldo Fiddleravis, PT, [facility] Acute Rehab Services Pager [phone]

## 2016-04-04 NOTE — Care Management Note (Signed)
Case Management Note  Patient Details  Name: Catarina HartshornRonald Ebner MRN: [id number] Date of Birth: [birth year]  Subjective/Objective:     CM continuing to follow for d/c needs.                Action/Plan: 04/04/2016 This pt does NOT live in a group home, but a boarding house with "friends". The has no assistance or supervision of any kind. He is unable to stand and maintain his balance and lives on the second floor so must climb steps. Clearly at this time he would be unable to do this   Expected Discharge Date:  03/29/16               Expected Discharge Plan:  Skilled Nursing Facility  In-House Referral:  Clinical Social Work  Discharge planning Services  CM Consult  Post Acute Care Choice:  NA Choice offered to:  NA  DME Arranged:    DME Agency:     HH Arranged:    HH Agency:     Status of Service:  In process, will continue to follow  If discussed at Long Length of Stay Meetings, dates discussed:    Additional Comments:  Starlyn SkeansRoyal, Iori Gigante U, RN 04/04/2016, 11:25 AM

## 2016-04-04 NOTE — Progress Notes (Signed)
*  PRELIMINARY RESULTS* Vascular Ultrasound bilateral lower extremity venous duplex has been completed.  Preliminary findings: No evidence of deep vein thrombosis or baker's cysts bilaterally.  Chauncey FischerCharlotte C Johnathen Testa 04/04/2016, 1:01 PM

## 2016-04-05 LAB — GLUCOSE, CAPILLARY: Glucose-Capillary: 101 mg/dL — ABNORMAL HIGH (ref 65–99)

## 2016-04-05 MED ORDER — CLONAZEPAM 0.5 MG PO TABS
0.5000 mg | ORAL_TABLET | Freq: Every day | ORAL | Status: DC
Start: 2016-04-05 — End: 2016-04-10
  Administered 2016-04-05 – 2016-04-09 (×5): 0.5 mg via ORAL
  Filled 2016-04-05 (×5): qty 1

## 2016-04-05 MED ORDER — CLONAZEPAM 0.5 MG PO TABS
0.5000 mg | ORAL_TABLET | Freq: Once | ORAL | Status: AC
  Administered 2016-04-05: 0.5 mg via ORAL
  Filled 2016-04-05: qty 1

## 2016-04-05 MED ORDER — CLONAZEPAM 0.5 MG PO TABS
0.5000 mg | ORAL_TABLET | Freq: Three times a day (TID) | ORAL | Status: DC | PRN
  Administered 2016-04-06: 0.5 mg via ORAL
  Filled 2016-04-05: qty 1

## 2016-04-05 NOTE — Progress Notes (Addendum)
PROGRESS NOTE        PATIENT DETAILS Name: Leonard HartshornRonald Sanluis Age: 60 y.o. Sex: male Date of Birth: 1956/03/01 Admit Date: 03/25/2016 Admitting Physician Bobette Moavid Manuel Ortiz, MD PCP:No PCP Per Patient  Brief Narrative: Patient is a 60 y.o. male with history of alcohol/cocaine use-who initially presented to the ED for evaluation of mouth pain ongoing 3 days prior to this admission-in the emergency room he was then found to be agitated and confused. He was subsequently admitted for further evaluation and treatment.  Subjective: Significantly less agitated and confused this morning. Slept well. Became slightly agitated earlier this afternoon and was attempting to get out of bed-required low dose Klonopin.   Assessment/Plan: Acute encephalopathy: Suspect related to alcohol and cocaine withdrawal. His mentation is slowly improving-continues to have delirium requiring prn IV Ativan. Have stopped Librium-as he is out of the window for withdrawal. Given intermittent delirium/agitation-have started  low dose Risperdal/Klonopin qhs-did not have any agitation last night-and did not require IV Ativan. Follow and optimize accordingly. Note seen by psychiatry again on 12/27 he does not have decisional capacity with respect to be discharged to group home.   Acute kidney injury: Resolved, likely mild prerenal azotemia due to dehydration.  Hypokalemia/hypomagnesemia: Repleted. Recheck periodically.  Paroxysmal atrial fibrillation: Back in sinus rhythm. Continue Cardizem, not a anticoagulation candidate given history of alcoholism/cocaine use/prior history of hemorrhagic CVA.  Mild Sinus Tachycardia: Observed earlier on 12/29-resolved.CXR neg for PNA, Lower ext doppler neg for DVT  Hypertension: Controlled-with cardizem  Subacute/chronic subdural hematoma: Per CT report-this appears stable and chronic.  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 CT head on 12/22 showed no hydrocephalus-VP shunt in stable position.  Prominent ascending thoracic aorta: Measures 44 cm. Recommend annual imaging with CT angiogram or MRI. This will be deferred to his outpatient M.D.  Malnutrition of moderate degree: Continue supplements  DVT Prophylaxis: Ted hose +SCD  Code Status: Full code   Family Communication: None at bedside  Disposition Plan: Remain inpatient-but will plan on SNF on discharge in the next few days  Antimicrobial agents: None  Procedures: TTE 12/20>>EF 65-70%, grade 1 diastolic dysfunction  CONSULTS:  psychiatry  Time spent: 25- minutes-Greater than 50% of this time was spent in counseling, explanation of diagnosis, planning of further management, and coordination of care.  MEDICATIONS: Anti-infectives    None      Scheduled Meds: . diltiazem  180 mg Oral Daily  . enoxaparin (LOVENOX) injection  40 mg Subcutaneous Q24H  . feeding supplement (ENSURE ENLIVE)  237 mL Oral BID BM  . risperiDONE  0.5 mg Oral QHS   Continuous Infusions:  PRN Meds:.clonazePAM, clonazePAM, guaiFENesin, hydrALAZINE, LORazepam, ondansetron **OR** ondansetron (ZOFRAN) IV   PHYSICAL EXAM: Vital signs: Vitals:   04/04/16 2053 04/05/16 0151 04/05/16 0602 04/05/16 0827  BP: (!) 135/96 (!) 138/103 (!) 147/102 135/84  Pulse: 88 94 (!) 106 90  Resp: 17 16 18 18   Temp: 98.2 F (36.8 C) 98.3 F (36.8 C) 99 F (37.2 C) 98.2 F (36.8 C)  TempSrc:  Oral Oral Axillary  SpO2: 96% 100% 100% 97%  Weight: 56 kg (123 lb 7.3 oz)     Height:       Filed Weights   04/01/16 2316 04/02/16 1959  04/04/16 2053  Weight: 55.6 kg (122 lb 9.2 oz) 56.3 kg (124 lb 1.9 oz) 56 kg (123 lb 7.3 oz)   Body mass index is 18.23 kg/m.   General appearance :Awake, alert, chronically ill-appearing follows commands. Eyes:, pupils equally reactive to light and  accomodation,no scleral icterus. HEENT: Atraumatic and Normocephalic Neck: supple, no JVD. No cervical lymphadenopathy.  Resp:Good air entry bilaterally, no added sounds  CVS: S1 S2 regular GI: Bowel sounds present, Non tender and not distended with no gaurding, rigidity or rebound.No organomegaly Extremities: B/L Lower Ext shows no edema, both legs are warm to touch Neurology:  speech clear,Non focal-but with significant generalized weakness Musculoskeletal:No digital cyanosis Skin:No Rash, warm and dry Wounds:N/A  I have personally reviewed following labs and imaging studies  LABORATORY DATA: CBC:  Recent Labs Lab 03/30/16 0232 03/31/16 0523 04/01/16 0554 04/02/16 0559 04/04/16 0756  WBC 7.5 7.8 11.5* 10.0 6.9  HGB 13.7 13.4 12.9* 13.3 12.9*  HCT 39.5 40.0 36.9* 38.4* 38.6*  MCV 85.5 86.6 84.8 84.6 87.9  PLT 368 394 384 393 393    Basic Metabolic Panel:  Recent Labs Lab 03/30/16 0232 03/31/16 0523 04/01/16 0554 04/02/16 0559 04/03/16 0543 04/04/16 0756  NA 140 140 140 140 140 142  K 3.4* 3.8 3.4* 3.9 4.2 4.0  CL 111 109 106 105 105 111  CO2 20* 26 24 26 26 22   GLUCOSE 89 105* 100* 92 110* 91  BUN 9 9 8 14  21* 21*  CREATININE 1.00 1.16 1.17 1.20 1.15 1.10  CALCIUM 8.8* 8.7* 9.2 8.9 9.1 8.4*  MG 1.6* 1.8 1.6* 2.0 1.9  --     GFR: Estimated Creatinine Clearance: 55.9 mL/min (by C-G formula based on SCr of 1.1 mg/dL).  Liver Function Tests:  Recent Labs Lab 03/30/16 0232  AST 14*  ALT 11*  ALKPHOS 61  BILITOT 0.9  PROT 6.4*  ALBUMIN 3.0*   No results for input(s): LIPASE, AMYLASE in the last 168 hours. No results for input(s): AMMONIA in the last 168 hours.  Coagulation Profile: No results for input(s): INR, PROTIME in the last 168 hours.  Cardiac Enzymes: No results for input(s): CKTOTAL, CKMB, CKMBINDEX, TROPONINI in the last 168 hours.  BNP (last 3 results) No results for input(s): PROBNP in the last 8760 hours.  HbA1C: No results for  input(s): HGBA1C in the last 72 hours.  CBG:  Recent Labs Lab 04/02/16 1140 04/03/16 0735 04/04/16 0749 04/04/16 1243 04/05/16 0825  GLUCAP 111* 120* 94 115* 101*    Lipid Profile: No results for input(s): CHOL, HDL, LDLCALC, TRIG, CHOLHDL, LDLDIRECT in the last 72 hours.  Thyroid Function Tests: No results for input(s): TSH, T4TOTAL, FREET4, T3FREE, THYROIDAB in the last 72 hours.  Anemia Panel: No results for input(s): VITAMINB12, FOLATE, FERRITIN, TIBC, IRON, RETICCTPCT in the last 72 hours.  Urine analysis:    Component Value Date/Time   COLORURINE YELLOW 03/25/2016 1555   APPEARANCEUR CLEAR 03/25/2016 1555   LABSPEC 1.014 03/25/2016 1555   PHURINE 5.0 03/25/2016 1555   GLUCOSEU NEGATIVE 03/25/2016 1555   HGBUR NEGATIVE 03/25/2016 1555   BILIRUBINUR NEGATIVE 03/25/2016 1555   KETONESUR 20 (A) 03/25/2016 1555   PROTEINUR 30 (A) 03/25/2016 1555   UROBILINOGEN 0.2 11/05/2010 2248   NITRITE NEGATIVE 03/25/2016 1555   LEUKOCYTESUR NEGATIVE 03/25/2016 1555    Sepsis Labs: Lactic Acid, Venous    Component Value Date/Time   LATICACIDVEN 1.1 03/27/2016 1901    MICROBIOLOGY: No results found for this or  any previous visit (from the past 240 hour(s)).  RADIOLOGY STUDIES/RESULTS: Dg Chest 2 View  Result Date: 03/25/2016 CLINICAL DATA:  Syncope.  Ibuprofen overdose. EXAM: CHEST  2 VIEW COMPARISON:  11/06/2010. FINDINGS: Normal sized heart. Tortuous aorta. Clear lungs with normal vascularity. Right ventriculoperitoneal shunt tube. Unremarkable bones. IMPRESSION: No acute abnormality. Electronically Signed   By: Beckie Salts M.D.   On: 03/25/2016 16:28   Ct Head Wo Contrast  Result Date: 03/28/2016 CLINICAL DATA:  Initial evaluation for acute altered mental status, fall out of that. EXAM: CT HEAD WITHOUT CONTRAST TECHNIQUE: Contiguous axial images were obtained from the base of the skull through the vertex without intravenous contrast. COMPARISON:  Comparison made with  prior CT from 03/25/2016. FINDINGS: Brain: Cerebral atrophy with chronic microvascular ischemic disease noted, stable. Right parietal approach VP shunt catheter remains in place with tip at the left basal ganglia, stable. Ventricular size is unchanged without evidence for hydrocephalus. Streak artifact from surgical clips from prior aneurysm repair again seen. No evidence for acute intracranial hemorrhage. No findings to suggest acute large vessel territory infarct. No mass lesion, midline shift, or mass effect. There is a left holo hemispheric subdural collection overlying the left cerebral hemisphere. This measures up to 4 mm in maximal thickness. Collection is largely I so to low-density as compared to the adjacent brain, compatible with a sub acute collection. No appreciable there is trace 3 mm left-to-right shift. Upon review of prior CT of from 03/25/2016, this collection appears to have been present on previous exam, although extremely difficult to visualize on that study, as this collection was only trace in size on that exam. Trace left-to-right shift is not significantly changed. No hyperdense component within this collection to suggest that this has acutely bled with recent fall. This collection is suspected to at least in part be related to the patient's shunt. No other extra-axial fluid collection. Vascular: No hyperdense vessel, although evaluation limited by streak artifact from aneurysm clip. Skull: Scalp soft tissues demonstrate no acute abnormality. Calvarium unchanged. Postoperative changes from prior right pterional craniotomy again seen. Sinuses/Orbits: Globes and orbital soft tissues within normal limits. Scattered mucosal thickening within the ethmoidal air cells and right maxillary sinus. Paranasal sinuses are otherwise clear. No mastoid effusion. IMPRESSION: 1. Left holo hemispheric subdural collection measuring up to 4 mm as above, subacute to chronic in appearance. Although not mentioned  previously, this collection was likely present on previous exam from 03/25/2016, although extremely difficult to visualize on that study as this was only trace in size at that time. The apparent interval increase in size suspected to be related to shunting. No internal hyperdensity to suggest that this has acutely bled with recent fall. 2. Trace 2-3 mm of left-to-right shift, relatively stable. 3. No other acute intracranial process. 4. 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 5. Right parietal approach VP shunt in stable position. No hydrocephalus. Electronically Signed   By: Rise Mu M.D.   On: 03/28/2016 01:59   Ct Head Wo Contrast  Result Date: 03/25/2016 CLINICAL DATA:  60 year old male with a history of altered mental status EXAM: CT HEAD WITHOUT CONTRAST TECHNIQUE: Contiguous axial images were obtained from the base of the skull through the vertex without intravenous contrast. COMPARISON:  07/31/2014, 11/06/2010 FINDINGS: Brain: No acute intracranial hemorrhage. No midline shift or mass effect. Unremarkable appearance of the ventricular system. 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 Skull:  Surgical changes of right pterional craniotomy for surgical clipping of aneurysm. No acute bony abnormality. Right posterior parietal approach ventriculostomy terminates to the left of midline. Sinuses/Orbits: No significant sinus disease. Other: None IMPRESSION: No CT evidence of acute intracranial abnormality. 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 Signed, Yvone NeuJaime S. Loreta AveWagner, DO [facility] Electronically Signed   By: Gilmer MorJaime  Wagner D.O.   On: 03/25/2016 20:30   Ct  Chest Wo Contrast  Result Date: 03/26/2016 CLINICAL DATA:  Shortness of breath. Concern for aspiration pneumonitis EXAM: CT CHEST WITHOUT CONTRAST TECHNIQUE: Multidetector CT imaging of the chest was performed following the standard protocol without IV contrast. COMPARISON:  Chest radiograph March 25, 2016 FINDINGS: Cardiovascular: The ascending thoracic aortic diameter is measured at 4.0 x 4.0 cm, measured on axial slice 59 series 201. The thoracic aorta is somewhat ectatic. There are scattered foci of atherosclerotic calcification in the aorta. The visualized great vessels appear unremarkable on this noncontrast enhanced study. There is slight pericardial thickening without pericardial effusion evident. Mediastinum/Nodes: Thyroid appears unremarkable. There is no evident thoracic adenopathy. Lungs/Pleura: There is mild scarring in the lung apices with slight bullous disease in the extreme apices bilaterally. There are scattered small bullae throughout the lungs bilaterally. There is bibasilar atelectatic change, slightly more on the right than on the left. There is no frank edema or consolidation. There is no pleural effusion or pleural thickening evident. Upper Abdomen: Visualized upper abdominal structures appear unremarkable. Musculoskeletal: There are no blastic or lytic bone lesions. IMPRESSION: Bibasilar atelectatic change, slightly more on the right than on the left, likely due to dependent positioning. No edema or consolidation. No findings felt to represent aspiration pneumonitis. No adenopathy. Prominence of the ascending thoracic aorta with a measured diameter 4.0 x 4.0 cm. Recommend annual imaging followup by CTA or MRA. This recommendation follows 2010 ACCF/AHA/AATS/ACR/ASA/SCA/SCAI/SIR/STS/SVM Guidelines for the Diagnosis and Management of Patients with Thoracic Aortic Disease. Circulation. 2010; 121: Z610-R604: [id number]. Thoracic aorta somewhat ectatic with scattered foci of atherosclerotic  calcification. Borderline pericardial thickening without pericardial effusion. Electronically Signed   By: Bretta BangWilliam  Woodruff III M.D.   On: 03/26/2016 10:48   Dg Chest Port 1 View  Result Date: 04/04/2016 CLINICAL DATA:  Cough beginning this morning. EXAM: PORTABLE CHEST 1 VIEW COMPARISON:  03/25/2016 FINDINGS: Cardiac silhouette is normal in size. The aorta is uncoiled and tortuous. No mediastinal or hilar masses. No evidence of adenopathy. Clear lungs.  Lungs are mildly hyperexpanded. No pleural effusion or pneumothorax. Skeletal structures are intact. Right anterior chest wall ventriculostomy catheter is intact. IMPRESSION: No acute cardiopulmonary disease. Electronically Signed   By: Amie Portlandavid  Ormond M.D.   On: 04/04/2016 10:32     LOS: 11 days   Jeoffrey MassedGHIMIRE,Terique Kawabata, MD  [facility] Pager:[phone]  If 7PM-7AM, please contact night-coverage [email or web address] Password [email or web address] 04/05/2016, 3:34 PM

## 2016-04-05 NOTE — Progress Notes (Signed)
Patient was notified that safety camera would be turned on in room. Patient verbalized understanding and agreed to have safety camera.

## 2016-04-06 LAB — BASIC METABOLIC PANEL
Anion gap: 12 (ref 5–15)
BUN: 26 mg/dL — AB (ref 6–20)
CO2: 20 mmol/L — ABNORMAL LOW (ref 22–32)
Calcium: 9.1 mg/dL (ref 8.9–10.3)
Chloride: 104 mmol/L (ref 101–111)
Creatinine, Ser: 1.58 mg/dL — ABNORMAL HIGH (ref 0.61–1.24)
GFR calc Af Amer: 53 mL/min — ABNORMAL LOW (ref >60)
GFR, EST NON AFRICAN AMERICAN: 46 mL/min — AB (ref >60)
GLUCOSE: 105 mg/dL — AB (ref 65–99)
POTASSIUM: 4.5 mmol/L (ref 3.5–5.1)
Sodium: 136 mmol/L (ref 135–145)

## 2016-04-06 LAB — CBC
HCT: 43.8 % (ref 39.0–52.0)
Hemoglobin: 15.1 g/dL (ref 13.0–17.0)
MCH: 29.5 pg (ref 26.0–34.0)
MCHC: 34.5 g/dL (ref 30.0–36.0)
MCV: 85.7 fL (ref 78.0–100.0)
PLATELETS: 383 10*3/uL (ref 150–400)
RBC: 5.11 MIL/uL (ref 4.22–5.81)
RDW: 14.9 % (ref 11.5–15.5)
WBC: 11.7 10*3/uL — ABNORMAL HIGH (ref 4.0–10.5)

## 2016-04-06 LAB — GLUCOSE, CAPILLARY: Glucose-Capillary: 109 mg/dL — ABNORMAL HIGH (ref 65–99)

## 2016-04-06 MED ORDER — SODIUM CHLORIDE 0.9 % IV BOLUS (SEPSIS): 1000.0000 mL | Freq: Once | INTRAVENOUS | Status: DC

## 2016-04-06 MED ORDER — SODIUM CHLORIDE 0.9 % IV SOLN
INTRAVENOUS | Status: DC
  Administered 2016-04-06 – 2016-04-08 (×2): via INTRAVENOUS

## 2016-04-06 MED ORDER — SODIUM CHLORIDE 0.9 % IV BOLUS (SEPSIS)
1000.0000 mL | Freq: Once | INTRAVENOUS | Status: AC
  Administered 2016-04-06: 1000 mL via INTRAVENOUS

## 2016-04-06 NOTE — Progress Notes (Signed)
Patient's BP has improved (108/69), alert and responsive.

## 2016-04-06 NOTE — Progress Notes (Signed)
PROGRESS NOTE        PATIENT DETAILS Name: Leonard Forbes Age: 60 y.o. Sex: male Date of Birth: 11-11-1955 Admit Date: 03/25/2016 Admitting Physician Bobette Mo, MD PCP:No PCP Per Patient  Brief Narrative: Patient is a 60 y.o. male with history of alcohol/cocaine use-who initially presented to the ED for evaluation of mouth pain ongoing 3 days prior to this admission-in the emergency room he was then found to be agitated and confused. He was subsequently admitted for further evaluation and treatment. Hospital course has been complicated by prolonged delirium and confusion, slowly improving, medications being adjusted.  Subjective: Awake-lethargic but answering a few questions. Per RN did not require any IV Ativan overnight. Became slightly more agitated earlier this morning and received Klonopin. Follows commands, moves all 4 extremities. Significantly less agitated and confused this morning. Slept well. Became slightly agitated earlier this afternoon and was attempting to get out of bed-required low dose Klonopin.   Assessment/Plan: Acute encephalopathy: Suspect related to alcohol and cocaine withdrawal. His mentation is slowly improving-continues to have episodes of delirium and confusion. On low-dose Risperdal and Klonopin. Goal is to minimize IV Ativan as much as possible. Unfortunately has become very deconditioned, oral intake is not very reliable due to episodes of delirium. Plans are to discharge to SNF when he is more stable. Note seen by psychiatry again on 12/27 he does not have decisional capacity with respect to be discharged to group home.   Hypotension: Occurred on 12/31 non-being given IV fluids-patient's clinical status is essentially unchanged-somewhat lethargic but otherwise asymptomatic. Plans are to repeat labs, fluid challenge in progress. Will follow closely.  Acute kidney injury: Resolved, likely mild prerenal azotemia due to  dehydration.  Hypokalemia/hypomagnesemia: Repleted. Recheck periodically.  Paroxysmal atrial fibrillation: Back in sinus rhythm. Continue Cardizem, not a anticoagulation candidate given history of alcoholism/cocaine use/prior history of hemorrhagic CVA.  Mild Sinus Tachycardia: Observed earlier on 12/29-resolved.CXR neg for PNA, Lower ext doppler neg for DVT  Hypertension: Controlled-now with hypotension-hold cardizem  Subacute/chronic subdural hematoma: Per CT report-this appears stable and chronic.  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 CT head on 12/22 showed no hydrocephalus-VP shunt in stable position.  Prominent ascending thoracic aorta: Measures 44 cm. Recommend annual imaging with CT angiogram or MRI. This will be deferred to his outpatient M.D.  Malnutrition of moderate degree: Continue supplements  DVT Prophylaxis: Ted hose +SCD  Code Status: Full code   Family Communication: None at bedside  Disposition Plan: Remain inpatient-but will plan on SNF on discharge in the next few days  Antimicrobial agents: None  Procedures: TTE 12/20>>EF 65-70%, grade 1 diastolic dysfunction  CONSULTS:  psychiatry  Time spent: 25- minutes-Greater than 50% of this time was spent in counseling, explanation of diagnosis, planning of further management, and coordination of care.  MEDICATIONS: Anti-infectives    None      Scheduled Meds: . clonazePAM  0.5 mg Oral QHS  . diltiazem  180 mg Oral Daily  . feeding supplement (ENSURE ENLIVE)  237 mL Oral BID BM  . risperiDONE  0.5 mg Oral QHS  . sodium chloride  1,000 mL Intravenous Once   Continuous Infusions:  PRN Meds:.clonazePAM, guaiFENesin, hydrALAZINE, LORazepam, ondansetron **OR** ondansetron (ZOFRAN) IV   PHYSICAL EXAM: Vital  signs: Vitals:   04/05/16 2112 04/06/16 0552 04/06/16 1300  04/06/16 1325  BP: 121/81 115/86 (!) 78/62 108/69  Pulse: 81 (!) 119 (!) 115 (!) 115  Resp: 18 18 12    Temp: 97.7 F (36.5 C) 98.6 F (37 C) 97.8 F (36.6 C) 97.8 F (36.6 C)  TempSrc: Oral Oral Oral   SpO2: 100% 98%    Weight:      Height:       Filed Weights   04/01/16 2316 04/02/16 1959 04/04/16 2053  Weight: 55.6 kg (122 lb 9.2 oz) 56.3 kg (124 lb 1.9 oz) 56 kg (123 lb 7.3 oz)   Body mass index is 18.23 kg/m.   General appearance :Awake, alert, chronically ill-appearing follows commands. Eyes:, pupils equally reactive to light and accomodation,no scleral icterus. HEENT: Atraumatic and Normocephalic Neck: supple, no JVD. No cervical lymphadenopathy.  Resp:Good air entry bilaterally, no added sounds  CVS: S1 S2 regular GI: Bowel sounds present, Non tender and not distended with no gaurding, rigidity or rebound.No organomegaly Extremities: B/L Lower Ext shows no edema, both legs are warm to touch Neurology:  speech clear,Non focal-but with significant generalized weakness Musculoskeletal:No digital cyanosis Skin:No Rash, warm and dry Wounds:N/A  I have personally reviewed following labs and imaging studies  LABORATORY DATA: CBC:  Recent Labs Lab 03/31/16 0523 04/01/16 0554 04/02/16 0559 04/04/16 0756  WBC 7.8 11.5* 10.0 6.9  HGB 13.4 12.9* 13.3 12.9*  HCT 40.0 36.9* 38.4* 38.6*  MCV 86.6 84.8 84.6 87.9  PLT 394 384 393 393    Basic Metabolic Panel:  Recent Labs Lab 03/31/16 0523 04/01/16 0554 04/02/16 0559 04/03/16 0543 04/04/16 0756  NA 140 140 140 140 142  K 3.8 3.4* 3.9 4.2 4.0  CL 109 106 105 105 111  CO2 26 24 26 26 22   GLUCOSE 105* 100* 92 110* 91  BUN 9 8 14  21* 21*  CREATININE 1.16 1.17 1.20 1.15 1.10  CALCIUM 8.7* 9.2 8.9 9.1 8.4*  MG 1.8 1.6* 2.0 1.9  --     GFR: Estimated Creatinine Clearance: 55.9 mL/min (by C-G formula based on SCr of 1.1 mg/dL).  Liver Function Tests: No results for input(s): AST, ALT, ALKPHOS, BILITOT, PROT,  ALBUMIN in the last 168 hours. No results for input(s): LIPASE, AMYLASE in the last 168 hours. No results for input(s): AMMONIA in the last 168 hours.  Coagulation Profile: No results for input(s): INR, PROTIME in the last 168 hours.  Cardiac Enzymes: No results for input(s): CKTOTAL, CKMB, CKMBINDEX, TROPONINI in the last 168 hours.  BNP (last 3 results) No results for input(s): PROBNP in the last 8760 hours.  HbA1C: No results for input(s): HGBA1C in the last 72 hours.  CBG:  Recent Labs Lab 04/03/16 0735 04/04/16 0749 04/04/16 1243 04/05/16 0825 04/06/16 0809  GLUCAP 120* 94 115* 101* 109*    Lipid Profile: No results for input(s): CHOL, HDL, LDLCALC, TRIG, CHOLHDL, LDLDIRECT in the last 72 hours.  Thyroid Function Tests: No results for input(s): TSH, T4TOTAL, FREET4, T3FREE, THYROIDAB in the last 72 hours.  Anemia Panel: No results for input(s): VITAMINB12, FOLATE, FERRITIN, TIBC, IRON, RETICCTPCT in the last 72 hours.  Urine analysis:    Component Value Date/Time   COLORURINE YELLOW 03/25/2016 1555   APPEARANCEUR CLEAR 03/25/2016 1555   LABSPEC 1.014 03/25/2016 1555   PHURINE 5.0 03/25/2016 1555   GLUCOSEU NEGATIVE 03/25/2016 1555   HGBUR NEGATIVE 03/25/2016 1555   BILIRUBINUR NEGATIVE 03/25/2016 1555   KETONESUR 20 (A)  03/25/2016 1555   PROTEINUR 30 (A) 03/25/2016 1555   UROBILINOGEN 0.2 11/05/2010 2248   NITRITE NEGATIVE 03/25/2016 1555   LEUKOCYTESUR NEGATIVE 03/25/2016 1555    Sepsis Labs: Lactic Acid, Venous    Component Value Date/Time   LATICACIDVEN 1.1 03/27/2016 1901    MICROBIOLOGY: No results found for this or any previous visit (from the past 240 hour(s)).  RADIOLOGY STUDIES/RESULTS: Dg Chest 2 View  Result Date: 03/25/2016 CLINICAL DATA:  Syncope.  Ibuprofen overdose. EXAM: CHEST  2 VIEW COMPARISON:  11/06/2010. FINDINGS: Normal sized heart. Tortuous aorta. Clear lungs with normal vascularity. Right ventriculoperitoneal shunt tube.  Unremarkable bones. IMPRESSION: No acute abnormality. Electronically Signed   By: Beckie Salts M.D.   On: 03/25/2016 16:28   Ct Head Wo Contrast  Result Date: 03/28/2016 CLINICAL DATA:  Initial evaluation for acute altered mental status, fall out of that. EXAM: CT HEAD WITHOUT CONTRAST TECHNIQUE: Contiguous axial images were obtained from the base of the skull through the vertex without intravenous contrast. COMPARISON:  Comparison made with prior CT from 03/25/2016. FINDINGS: Brain: Cerebral atrophy with chronic microvascular ischemic disease noted, stable. Right parietal approach VP shunt catheter remains in place with tip at the left basal ganglia, stable. Ventricular size is unchanged without evidence for hydrocephalus. Streak artifact from surgical clips from prior aneurysm repair again seen. No evidence for acute intracranial hemorrhage. No findings to suggest acute large vessel territory infarct. No mass lesion, midline shift, or mass effect. There is a left holo hemispheric subdural collection overlying the left cerebral hemisphere. This measures up to 4 mm in maximal thickness. Collection is largely I so to low-density as compared to the adjacent brain, compatible with a sub acute collection. No appreciable there is trace 3 mm left-to-right shift. Upon review of prior CT of from 03/25/2016, this collection appears to have been present on previous exam, although extremely difficult to visualize on that study, as this collection was only trace in size on that exam. Trace left-to-right shift is not significantly changed. No hyperdense component within this collection to suggest that this has acutely bled with recent fall. This collection is suspected to at least in part be related to the patient's shunt. No other extra-axial fluid collection. Vascular: No hyperdense vessel, although evaluation limited by streak artifact from aneurysm clip. Skull: Scalp soft tissues demonstrate no acute abnormality.  Calvarium unchanged. Postoperative changes from prior right pterional craniotomy again seen. Sinuses/Orbits: Globes and orbital soft tissues within normal limits. Scattered mucosal thickening within the ethmoidal air cells and right maxillary sinus. Paranasal sinuses are otherwise clear. No mastoid effusion. IMPRESSION: 1. Left holo hemispheric subdural collection measuring up to 4 mm as above, subacute to chronic in appearance. Although not mentioned previously, this collection was likely present on previous exam from 03/25/2016, although extremely difficult to visualize on that study as this was only trace in size at that time. The apparent interval increase in size suspected to be related to shunting. No internal hyperdensity to suggest that this has acutely bled with recent fall. 2. Trace 2-3 mm of left-to-right shift, relatively stable. 3. No other acute intracranial process. 4. 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 5. Right parietal approach VP shunt in stable position. No hydrocephalus. Electronically Signed   By: Rise Mu M.D.   On: 03/28/2016 01:59   Ct Head Wo Contrast  Result Date: 03/25/2016 CLINICAL DATA:  60 year old male with a history of altered mental  status EXAM: CT HEAD WITHOUT CONTRAST TECHNIQUE: Contiguous axial images were obtained from the base of the skull through the vertex without intravenous contrast. COMPARISON:  07/31/2014, 11/06/2010 FINDINGS: Brain: No acute intracranial hemorrhage. No midline shift or mass effect. Unremarkable appearance of the ventricular system. 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 Skull: Surgical changes of right pterional craniotomy for surgical clipping of aneurysm. No acute bony abnormality. Right posterior parietal approach ventriculostomy terminates to the left of midline. Sinuses/Orbits: No significant sinus  disease. Other: None IMPRESSION: No CT evidence of acute intracranial abnormality. 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 Signed, Yvone NeuJaime S. Loreta AveWagner, DO [facility] Electronically Signed   By: Gilmer MorJaime  Wagner D.O.   On: 03/25/2016 20:30   Ct Chest Wo Contrast  Result Date: 03/26/2016 CLINICAL DATA:  Shortness of breath. Concern for aspiration pneumonitis EXAM: CT CHEST WITHOUT CONTRAST TECHNIQUE: Multidetector CT imaging of the chest was performed following the standard protocol without IV contrast. COMPARISON:  Chest radiograph March 25, 2016 FINDINGS: Cardiovascular: The ascending thoracic aortic diameter is measured at 4.0 x 4.0 cm, measured on axial slice 59 series 201. The thoracic aorta is somewhat ectatic. There are scattered foci of atherosclerotic calcification in the aorta. The visualized great vessels appear unremarkable on this noncontrast enhanced study. There is slight pericardial thickening without pericardial effusion evident. Mediastinum/Nodes: Thyroid appears unremarkable. There is no evident thoracic adenopathy. Lungs/Pleura: There is mild scarring in the lung apices with slight bullous disease in the extreme apices bilaterally. There are scattered small bullae throughout the lungs bilaterally. There is bibasilar atelectatic change, slightly more on the right than on the left. There is no frank edema or consolidation. There is no pleural effusion or pleural thickening evident. Upper Abdomen: Visualized upper abdominal structures appear unremarkable. Musculoskeletal: There are no blastic or lytic bone lesions. IMPRESSION: Bibasilar atelectatic change, slightly more on the right than on the left, likely due to dependent positioning. No edema or consolidation. No findings felt to  represent aspiration pneumonitis. No adenopathy. Prominence of the ascending thoracic aorta with a measured diameter 4.0 x 4.0 cm. Recommend annual imaging followup by CTA or MRA. This recommendation follows 2010 ACCF/AHA/AATS/ACR/ASA/SCA/SCAI/SIR/STS/SVM Guidelines for the Diagnosis and Management of Patients with Thoracic Aortic Disease. Circulation. 2010; 121: B147-W295: [id number]. Thoracic aorta somewhat ectatic with scattered foci of atherosclerotic calcification. Borderline pericardial thickening without pericardial effusion. Electronically Signed   By: Bretta BangWilliam  Woodruff III M.D.   On: 03/26/2016 10:48   Dg Chest Port 1 View  Result Date: 04/04/2016 CLINICAL DATA:  Cough beginning this morning. EXAM: PORTABLE CHEST 1 VIEW COMPARISON:  03/25/2016 FINDINGS: Cardiac silhouette is normal in size. The aorta is uncoiled and tortuous. No mediastinal or hilar masses. No evidence of adenopathy. Clear lungs.  Lungs are mildly hyperexpanded. No pleural effusion or pneumothorax. Skeletal structures are intact. Right anterior chest wall ventriculostomy catheter is intact. IMPRESSION: No acute cardiopulmonary disease. Electronically Signed   By: Amie Portlandavid  Ormond M.D.   On: 04/04/2016 10:32     LOS: 12 days   Jeoffrey MassedGHIMIRE,Mohammad Granade, MD  [facility] Pager:[phone]  If 7PM-7AM, please contact night-coverage [email or web address] Password [email or web address] 04/06/2016, 1:33 PM

## 2016-04-06 NOTE — Progress Notes (Signed)
Notified MD of decreased blood pressure 78/62 with poor oral intake and decreased output; lethargic. New orders obtained for normal saline bolus. Patient is currently alert and responsive and eating lunch with assistance. Will continue to monitor.

## 2016-04-07 DIAGNOSIS — N179 Acute kidney failure, unspecified: Secondary | ICD-10-CM

## 2016-04-07 LAB — BASIC METABOLIC PANEL
Anion gap: 3 — ABNORMAL LOW (ref 5–15)
BUN: 25 mg/dL — AB (ref 6–20)
CALCIUM: 8.6 mg/dL — AB (ref 8.9–10.3)
CHLORIDE: 111 mmol/L (ref 101–111)
CO2: 28 mmol/L (ref 22–32)
CREATININE: 1.2 mg/dL (ref 0.61–1.24)
GFR calc Af Amer: >60 mL/min (ref >60)
GFR calc non Af Amer: >60 mL/min (ref >60)
GLUCOSE: 108 mg/dL — AB (ref 65–99)
Potassium: 4 mmol/L (ref 3.5–5.1)
Sodium: 142 mmol/L (ref 135–145)

## 2016-04-07 LAB — PHOSPHORUS: PHOSPHORUS: 3.6 mg/dL (ref 2.5–4.6)

## 2016-04-07 LAB — MAGNESIUM: Magnesium: 1.9 mg/dL (ref 1.7–2.4)

## 2016-04-07 LAB — GLUCOSE, CAPILLARY: Glucose-Capillary: 104 mg/dL — ABNORMAL HIGH (ref 65–99)

## 2016-04-07 LAB — TSH: TSH: 0.726 u[IU]/mL (ref 0.350–4.500)

## 2016-04-07 MED ORDER — METOPROLOL TARTRATE 25 MG PO TABS
25.0000 mg | ORAL_TABLET | Freq: Two times a day (BID) | ORAL | Status: DC
  Administered 2016-04-07 – 2016-04-08 (×3): 25 mg via ORAL
  Filled 2016-04-07 (×3): qty 1

## 2016-04-07 NOTE — Progress Notes (Signed)
Pt was found sitting on the floor; when asked what happened, he said" He was trying to clean the floor." Pt was sitting on the recliner prior to fall. He insisted to be put on the recliner when he woke up around 9pm. Pt was asking about his dinner and was informed that dinner is over and was given snacks. While eating, he spilled some of his ensure and I put towel on the floor to clean it. However, I answered the call of another pt who needed to use the bedpan. When I returned to 6E15, I found the patient on the floor. Pt assisted back in bed. Pt denies any pain ar hitting his head on the floor or bed. Tama GanderKatherine Schorr ,NP notified.  Pt said no need to notify his family. He says he is ok. Will continue to monitor pt.

## 2016-04-07 NOTE — Progress Notes (Signed)
PROGRESS NOTE        PATIENT DETAILS Name: Leonard Forbes Age: 61 y.o. Sex: male Date of Birth: 10-24-1955 Admit Date: 03/25/2016 Admitting Physician Bobette Mo, MD PCP:No PCP Per Patient  Brief Narrative: Patient is a 61 y.o. male with history of alcohol/cocaine use-who initially presented to the ED for evaluation of mouth pain ongoing 3 days prior to this admission-in the emergency room he was then found to be agitated and confused. He was subsequently admitted for further evaluation and treatment. Hospital course has been complicated by prolonged delirium/confusion and deconditioning,slowly improving, medications being adjusted.  Subjective: Much more awake and alert this morning. Asking for breakfast. Answers all my questions appropriately.  Assessment/Plan: Acute encephalopathy: Suspect related to alcohol and cocaine withdrawal. Improving-episodes of delirium/confusion have started to decrease-he is in pretty awake and alert this morning. cOn low-dose Risperdal and Klonopin. Goal is to minimize IV Ativan as much as possible. Unfortunately has become very deconditioned, oral intake is not very reliable due to episodes of delirium. Plans are to discharge to SNF in the next few days. Note seen by psychiatry again on 12/27 he does not have decisional capacity with respect to be discharged to group home.   Hypotension: Resolved with IV fluid bolus. Suspect this was secondary to dehydration due to poor oral intake.  Acute kidney injury: Resolved, likely mild prerenal azotemia due to dehydration.  Hypokalemia/hypomagnesemia: Repleted. Recheck periodically.  Paroxysmal atrial fibrillation: Back in sinus rhythm. Now on oral Lopressor, not a anticoagulation candidate given history of alcoholism/cocaine use/prior history of hemorrhagic CVA/severe deconditioning and risk of falls.  Mild Sinus Tachycardia: Observed earlier on 12/29-resolved.CXR neg for PNA, Lower  ext doppler neg for DVT. Check TSH.  Hypertension: Controlled-all antihypertensives were discontinued on 12/31 due to hypotension-we will start low-dose metoprolol today. Follow.  Subacute/chronic subdural hematoma: Per CT report-this appears stable and chronic.  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 CT head on 12/22 showed no hydrocephalus-VP shunt in stable position.  Prominent ascending thoracic aorta: Measures 44 cm. Recommend annual imaging with CT angiogram or MRI. This will be deferred to his outpatient M.D.  Malnutrition of moderate degree: Continue supplements  DVT Prophylaxis: Ted hose +SCD  Code Status: Full code   Family Communication: None at bedside  Disposition Plan: Remain inpatient-but will plan on SNF on discharge in the next few days  Antimicrobial agents: None  Procedures: TTE 12/20>>EF 65-70%, grade 1 diastolic dysfunction  CONSULTS:  psychiatry  Time spent: 25- minutes-Greater than 50% of this time was spent in counseling, explanation of diagnosis, planning of further management, and coordination of care.  MEDICATIONS: Anti-infectives    None      Scheduled Meds: . clonazePAM  0.5 mg Oral QHS  . feeding supplement (ENSURE ENLIVE)  237 mL Oral BID BM  . metoprolol tartrate  25 mg Oral BID  . risperiDONE  0.5 mg Oral QHS   Continuous Infusions: . sodium chloride 75 mL/hr at 04/06/16 1528   PRN Meds:.clonazePAM, guaiFENesin, hydrALAZINE, LORazepam, ondansetron **OR** ondansetron (ZOFRAN) IV   PHYSICAL EXAM: Vital signs: Vitals:   04/07/16 0039 04/07/16 0148 04/07/16 0230 04/07/16 0351  BP: (!) 118/93 106/78 99/74 115/78  Pulse: (!) 116 88 (!) 114 (!) 107  Resp: 16 16  15 14  Temp: 97.5 F (36.4 C) 98.6 F (37 C) 98 F (36.7 C) 97.5 F (36.4 C)  TempSrc: Oral Oral Oral Oral  SpO2: 96% 97% 97% 100%    Weight:    50.3 kg (111 lb)  Height:       Filed Weights   04/02/16 1959 04/04/16 2053 04/07/16 0351  Weight: 56.3 kg (124 lb 1.9 oz) 56 kg (123 lb 7.3 oz) 50.3 kg (111 lb)   Body mass index is 16.39 kg/m.   General appearance :Awake, alert, chronically ill-appearing follows commands. Eyes:, pupils equally reactive to light and accomodation,no scleral icterus. HEENT: Atraumatic and Normocephalic Neck: supple, no JVD. No cervical lymphadenopathy.  Resp:Good air entry bilaterally, no added sounds  CVS: S1 S2 regular GI: Bowel sounds present, Non tender and not distended with no gaurding, rigidity or rebound.No organomegaly Extremities: B/L Lower Ext shows no edema, both legs are warm to touch Neurology:  speech clear,Non focal-but with significant generalized weakness Musculoskeletal:No digital cyanosis Skin:No Rash, warm and dry Wounds:N/A  I have personally reviewed following labs and imaging studies  LABORATORY DATA: CBC:  Recent Labs Lab 04/01/16 0554 04/02/16 0559 04/04/16 0756 04/06/16 1358  WBC 11.5* 10.0 6.9 11.7*  HGB 12.9* 13.3 12.9* 15.1  HCT 36.9* 38.4* 38.6* 43.8  MCV 84.8 84.6 87.9 85.7  PLT 384 393 393 383    Basic Metabolic Panel:  Recent Labs Lab 04/01/16 0554 04/02/16 0559 04/03/16 0543 04/04/16 0756 04/06/16 1358 04/07/16 0802  NA 140 140 140 142 136 142  K 3.4* 3.9 4.2 4.0 4.5 4.0  CL 106 105 105 111 104 111  CO2 24 26 26 22  20* 28  GLUCOSE 100* 92 110* 91 105* 108*  BUN 8 14 21* 21* 26* 25*  CREATININE 1.17 1.20 1.15 1.10 1.58* 1.20  CALCIUM 9.2 8.9 9.1 8.4* 9.1 8.6*  MG 1.6* 2.0 1.9  --   --  1.9  PHOS  --   --   --   --   --  3.6    GFR: Estimated Creatinine Clearance: 46 mL/min (by C-G formula based on SCr of 1.2 mg/dL).  Liver Function Tests: No results for input(s): AST, ALT, ALKPHOS, BILITOT, PROT, ALBUMIN in the last 168 hours. No results for input(s): LIPASE, AMYLASE in the last 168 hours. No results for input(s):  AMMONIA in the last 168 hours.  Coagulation Profile: No results for input(s): INR, PROTIME in the last 168 hours.  Cardiac Enzymes: No results for input(s): CKTOTAL, CKMB, CKMBINDEX, TROPONINI in the last 168 hours.  BNP (last 3 results) No results for input(s): PROBNP in the last 8760 hours.  HbA1C: No results for input(s): HGBA1C in the last 72 hours.  CBG:  Recent Labs Lab 04/04/16 0749 04/04/16 1243 04/05/16 0825 04/06/16 0809 04/07/16 0751  GLUCAP 94 115* 101* 109* 104*    Lipid Profile: No results for input(s): CHOL, HDL, LDLCALC, TRIG, CHOLHDL, LDLDIRECT in the last 72 hours.  Thyroid Function Tests: No results for input(s): TSH, T4TOTAL, FREET4, T3FREE, THYROIDAB in the last 72 hours.  Anemia Panel: No results for input(s): VITAMINB12, FOLATE, FERRITIN, TIBC, IRON, RETICCTPCT in the last 72 hours.  Urine analysis:    Component Value Date/Time   COLORURINE YELLOW 03/25/2016 1555   APPEARANCEUR CLEAR 03/25/2016 1555   LABSPEC 1.014 03/25/2016 1555   PHURINE 5.0 03/25/2016 1555   GLUCOSEU NEGATIVE 03/25/2016 1555   HGBUR NEGATIVE 03/25/2016 1555   BILIRUBINUR NEGATIVE 03/25/2016 1555   KETONESUR 20 (  A) 03/25/2016 1555   PROTEINUR 30 (A) 03/25/2016 1555   UROBILINOGEN 0.2 11/05/2010 2248   NITRITE NEGATIVE 03/25/2016 1555   LEUKOCYTESUR NEGATIVE 03/25/2016 1555    Sepsis Labs: Lactic Acid, Venous    Component Value Date/Time   LATICACIDVEN 1.1 03/27/2016 1901    MICROBIOLOGY: No results found for this or any previous visit (from the past 240 hour(s)).  RADIOLOGY STUDIES/RESULTS: Dg Chest 2 View  Result Date: 03/25/2016 CLINICAL DATA:  Syncope.  Ibuprofen overdose. EXAM: CHEST  2 VIEW COMPARISON:  11/06/2010. FINDINGS: Normal sized heart. Tortuous aorta. Clear lungs with normal vascularity. Right ventriculoperitoneal shunt tube. Unremarkable bones. IMPRESSION: No acute abnormality. Electronically Signed   By: Beckie Salts M.D.   On: 03/25/2016  16:28   Ct Head Wo Contrast  Result Date: 03/28/2016 CLINICAL DATA:  Initial evaluation for acute altered mental status, fall out of that. EXAM: CT HEAD WITHOUT CONTRAST TECHNIQUE: Contiguous axial images were obtained from the base of the skull through the vertex without intravenous contrast. COMPARISON:  Comparison made with prior CT from 03/25/2016. FINDINGS: Brain: Cerebral atrophy with chronic microvascular ischemic disease noted, stable. Right parietal approach VP shunt catheter remains in place with tip at the left basal ganglia, stable. Ventricular size is unchanged without evidence for hydrocephalus. Streak artifact from surgical clips from prior aneurysm repair again seen. No evidence for acute intracranial hemorrhage. No findings to suggest acute large vessel territory infarct. No mass lesion, midline shift, or mass effect. There is a left holo hemispheric subdural collection overlying the left cerebral hemisphere. This measures up to 4 mm in maximal thickness. Collection is largely I so to low-density as compared to the adjacent brain, compatible with a sub acute collection. No appreciable there is trace 3 mm left-to-right shift. Upon review of prior CT of from 03/25/2016, this collection appears to have been present on previous exam, although extremely difficult to visualize on that study, as this collection was only trace in size on that exam. Trace left-to-right shift is not significantly changed. No hyperdense component within this collection to suggest that this has acutely bled with recent fall. This collection is suspected to at least in part be related to the patient's shunt. No other extra-axial fluid collection. Vascular: No hyperdense vessel, although evaluation limited by streak artifact from aneurysm clip. Skull: Scalp soft tissues demonstrate no acute abnormality. Calvarium unchanged. Postoperative changes from prior right pterional craniotomy again seen. Sinuses/Orbits: Globes and  orbital soft tissues within normal limits. Scattered mucosal thickening within the ethmoidal air cells and right maxillary sinus. Paranasal sinuses are otherwise clear. No mastoid effusion. IMPRESSION: 1. Left holo hemispheric subdural collection measuring up to 4 mm as above, subacute to chronic in appearance. Although not mentioned previously, this collection was likely present on previous exam from 03/25/2016, although extremely difficult to visualize on that study as this was only trace in size at that time. The apparent interval increase in size suspected to be related to shunting. No internal hyperdensity to suggest that this has acutely bled with recent fall. 2. Trace 2-3 mm of left-to-right shift, relatively stable. 3. No other acute intracranial process. 4. 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 5. Right parietal approach VP shunt in stable position. No hydrocephalus. Electronically Signed   By: Rise Mu M.D.   On: 03/28/2016 01:59   Ct Head Wo Contrast  Result Date: 03/25/2016 CLINICAL DATA:  61 year old male with a history of altered  mental status EXAM: CT HEAD WITHOUT CONTRAST TECHNIQUE: Contiguous axial images were obtained from the base of the skull through the vertex without intravenous contrast. COMPARISON:  07/31/2014, 11/06/2010 FINDINGS: Brain: No acute intracranial hemorrhage. No midline shift or mass effect. Unremarkable appearance of the ventricular system. 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 Skull: Surgical changes of right pterional craniotomy for surgical clipping of aneurysm. No acute bony abnormality. Right posterior parietal approach ventriculostomy terminates to the left of midline. Sinuses/Orbits: No significant sinus disease. Other: None IMPRESSION: No CT evidence of acute intracranial abnormality. Surgical changes of right pterional  craniotomy for surgical clipping of likely a right-sided P-comm aneurysm, with right posterior parietal approach ventriculostomy terminating to the left of midline. Signed, Yvone NeuJaime S. Loreta AveWagner, DO [facility] Electronically Signed   By: Gilmer MorJaime  Wagner D.O.   On: 03/25/2016 20:30   Ct Chest Wo Contrast  Result Date: 03/26/2016 CLINICAL DATA:  Shortness of breath. Concern for aspiration pneumonitis EXAM: CT CHEST WITHOUT CONTRAST TECHNIQUE: Multidetector CT imaging of the chest was performed following the standard protocol without IV contrast. COMPARISON:  Chest radiograph March 25, 2016 FINDINGS: Cardiovascular: The ascending thoracic aortic diameter is measured at 4.0 x 4.0 cm, measured on axial slice 59 series 201. The thoracic aorta is somewhat ectatic. There are scattered foci of atherosclerotic calcification in the aorta. The visualized great vessels appear unremarkable on this noncontrast enhanced study. There is slight pericardial thickening without pericardial effusion evident. Mediastinum/Nodes: Thyroid appears unremarkable. There is no evident thoracic adenopathy. Lungs/Pleura: There is mild scarring in the lung apices with slight bullous disease in the extreme apices bilaterally. There are scattered small bullae throughout the lungs bilaterally. There is bibasilar atelectatic change, slightly more on the right than on the left. There is no frank edema or consolidation. There is no pleural effusion or pleural thickening evident. Upper Abdomen: Visualized upper abdominal structures appear unremarkable. Musculoskeletal: There are no blastic or lytic bone lesions. IMPRESSION: Bibasilar atelectatic change, slightly more on the right than on the left, likely due to dependent positioning. No edema or consolidation. No findings felt to represent aspiration pneumonitis. No adenopathy. Prominence of the ascending thoracic aorta with a measured diameter  4.0 x 4.0 cm. Recommend annual imaging followup by CTA or MRA. This recommendation follows 2010 ACCF/AHA/AATS/ACR/ASA/SCA/SCAI/SIR/STS/SVM Guidelines for the Diagnosis and Management of Patients with Thoracic Aortic Disease. Circulation. 2010; 121: Z610-R604: [id number]. Thoracic aorta somewhat ectatic with scattered foci of atherosclerotic calcification. Borderline pericardial thickening without pericardial effusion. Electronically Signed   By: Bretta BangWilliam  Woodruff III M.D.   On: 03/26/2016 10:48   Dg Chest Port 1 View  Result Date: 04/04/2016 CLINICAL DATA:  Cough beginning this morning. EXAM: PORTABLE CHEST 1 VIEW COMPARISON:  03/25/2016 FINDINGS: Cardiac silhouette is normal in size. The aorta is uncoiled and tortuous. No mediastinal or hilar masses. No evidence of adenopathy. Clear lungs.  Lungs are mildly hyperexpanded. No pleural effusion or pneumothorax. Skeletal structures are intact. Right anterior chest wall ventriculostomy catheter is intact. IMPRESSION: No acute cardiopulmonary disease. Electronically Signed   By: Amie Portlandavid  Ormond M.D.   On: 04/04/2016 10:32     LOS: 13 days   Jeoffrey MassedGHIMIRE,Kylia Grajales, MD  [facility] Pager:[phone]  If 7PM-7AM, please contact night-coverage [email or web address] Password [email or web address] 04/07/2016, 11:23 AM

## 2016-04-08 DIAGNOSIS — I480 Paroxysmal atrial fibrillation: Secondary | ICD-10-CM

## 2016-04-08 LAB — GLUCOSE, CAPILLARY: Glucose-Capillary: 102 mg/dL — ABNORMAL HIGH (ref 65–99)

## 2016-04-08 MED ORDER — METOPROLOL TARTRATE 50 MG PO TABS
50.0000 mg | ORAL_TABLET | Freq: Two times a day (BID) | ORAL | Status: DC
  Administered 2016-04-08 – 2016-04-09 (×2): 50 mg via ORAL
  Filled 2016-04-08 (×3): qty 1

## 2016-04-08 MED ORDER — CLONAZEPAM 0.5 MG PO TABS: 0.5000 mg | ORAL_TABLET | Freq: Every day | ORAL | 0 refills | Status: AC

## 2016-04-08 MED ORDER — ENSURE ENLIVE PO LIQD: 237.0000 mL | Freq: Two times a day (BID) | ORAL | Status: AC

## 2016-04-08 MED ORDER — RISPERIDONE 0.5 MG PO TABS: 0.5000 mg | ORAL_TABLET | Freq: Every day | ORAL | Status: AC

## 2016-04-08 MED ORDER — METOPROLOL TARTRATE 50 MG PO TABS: 50.0000 mg | ORAL_TABLET | Freq: Two times a day (BID) | ORAL | Status: AC

## 2016-04-08 MED ORDER — CLONAZEPAM 0.5 MG PO TABS: 0.5000 mg | ORAL_TABLET | Freq: Three times a day (TID) | ORAL | 0 refills | Status: AC | PRN

## 2016-04-08 NOTE — Progress Notes (Signed)
Nutrition Follow-up  DOCUMENTATION CODES:   Non-severe (moderate) malnutrition in context of chronic illness  INTERVENTION:  Recommend continuation of nutritional supplements at SNF to aid in caloric and protein needs.  NUTRITION DIAGNOSIS:   Increased nutrient needs related to chronic illness as evidenced by estimated needs; ongoing  GOAL:   Patient will meet greater than or equal to 90% of their needs; met  MONITOR:   PO intake, Supplement acceptance, Labs, Weight trends, I & O's  REASON FOR ASSESSMENT:   Consult Assessment of nutrition requirement/status  ASSESSMENT:   61 y.o. Male with known hx of hypertension, stroke, alcohol abuse who was brought to the emergency department after having a syncopal episode at the urgent care center earlier in the day prior to this admission. Pt was apparently at the [facility] for evaluation of right sided hard palate lesion. Pt reported taking large amount of Ibuprofen. Girlfriend who was present with pt at the [facility], reported pt used cocaine 3 days piror to this admission.   Meal completion has been varied from 10-75% with 75% at lunch today. Pt currently has Ensure ordered and has been consuming them. Plans for pt to discharge to SNF. Recommend continuation of nutritional supplements at SNF to aid in adequate caloric and protein needs. Labs and medications reviewed.   Diet Order:  Diet Heart Room service appropriate? Yes; Fluid consistency: Thin Diet - low sodium heart healthy  Skin:  Reviewed, no issues  Last BM:  12/31  Height:   Ht Readings from Last 1 Encounters:  03/26/16 5' 9"  (1.753 m)    Weight:   Wt Readings from Last 1 Encounters:  04/07/16 110 lb (49.9 kg)    Ideal Body Weight:  73 kg  BMI:  Body mass index is 16.24 kg/m.  Estimated Nutritional Needs:   Kcal:  1700-1900  Protein:  80-90 gm  Fluid:  1.7-1.9 L  EDUCATION NEEDS:   No education needs identified at this time  Corrin Parker, MS, RD, LDN Pager  # [phone] After hours/ weekend pager # [phone]

## 2016-04-08 NOTE — Progress Notes (Signed)
Physical Therapy Treatment Patient Details Name: Leonard HartshornRonald Forbes MRN: [id number] DOB: 01-Sep-1955 Today's Date: 04/08/2016    History of Present Illness Pt adm with syncope. Pt with ?sepsis and now with ETOH withdrawal. Pt with fall on 12/21 with CT showing small subacute SDH present on admission. PMH - polysubstance abuse. aneurysm, craniotomy, vp shunt, HTN, CVA    PT Comments    Much improved mobility and gait stability.  Pt needing RW for stability, improved stamina, but fatiguing out within 190 foot rang.  Mentation has also significantly improved.  Follow Up Recommendations  SNF     Equipment Recommendations       Recommendations for Other Services       Precautions / Restrictions Precautions Precautions: Fall    Mobility  Bed Mobility Overal bed mobility: Needs Assistance Bed Mobility: Supine to Sit     Supine to sit: Min assist     General bed mobility comments: steady assist otherwise no assist  Transfers Overall transfer level: Needs assistance   Transfers: Sit to/from Stand Sit to Stand: Min assist         General transfer comment: steady assist  Ambulation/Gait Ambulation/Gait assistance: Min assist;+2 safety/equipment Ambulation Distance (Feet): 190 Feet Assistive device: Rolling walker (2 wheeled) Gait Pattern/deviations: Step-through pattern   Gait velocity interpretation: Below normal speed for age/gender General Gait Details: mild scissoring, heavy use of the RW and finally fatigue with sagging of legs   Stairs            Wheelchair Mobility    Modified Rankin (Stroke Patients Only)       Balance Overall balance assessment: Needs assistance   Sitting balance-Leahy Scale: Fair       Standing balance-Leahy Scale: Poor Standing balance comment: reliance on AD and support                    Cognition Arousal/Alertness: Awake/alert Behavior During Therapy: WFL for tasks assessed/performed Overall Cognitive Status:  Impaired/Different from baseline (much improved)                      Exercises      General Comments        Pertinent Vitals/Pain Pain Assessment: No/denies pain    Home Living                      Prior Function            PT Goals (current goals can now be found in the care plan section) Acute Rehab PT Goals Patient Stated Goal: want to go home PT Goal Formulation: With patient Time For Goal Achievement: 04/15/16 Potential to Achieve Goals: Good Progress towards PT goals: Progressing toward goals    Frequency    Min 3X/week      PT Plan Current plan remains appropriate    Co-evaluation             End of Session   Activity Tolerance: Patient tolerated treatment well Patient left: in chair;with call bell/phone within reach;with chair alarm set     Time: [id number] PT Time Calculation (min) (ACUTE ONLY): 26 min  Charges:  $Gait Training: 8-22 mins $Therapeutic Activity: 8-22 mins                    G CodesEliseo Forbes:      Leonard Forbes Leonard Forbes 04/08/2016, 3:38 PM

## 2016-04-08 NOTE — Discharge Summary (Signed)
PATIENT DETAILS Name: Leonard Forbes Age: 61 y.o. Sex: male Date of Birth: 1955-11-06 MRN: [id number]. Admitting Physician: Bobette Mo, MD PCP:No PCP Per Patient  Admit Date: 03/25/2016 Discharge date: 04/08/2016  Recommendations for Outpatient Follow-up:  1. Follow up with PCP in 1-2 weeks 2. Please obtain BMP/CBC in one week 3. Recommend annual imaging with CT angiogram or MRI to evaluate prominent ascending aorta.  Admitted From:  Home  Disposition: SNF   Home Health: No  Equipment/Devices: None  Discharge Condition: Stable  CODE STATUS: FULL CODE  Diet recommendation:  Heart Healthy   Brief Summary: See H&P, Labs, Consult and Test reports for all details in brief, Patient is a 61 y.o. male with history of alcohol/cocaine use-who initially presented to the ED for evaluation of mouth pain ongoing 3 days prior to this admission-in the emergency room he was then found to be agitated and confused. He was subsequently admitted for further evaluation and treatment. Hospital course has been complicated by prolonged delirium/confusion and deconditioning,slowly improved-by day of discharge much better, following commands. He is very frail and deconditioned and will require SNF on discharge.  Brief Hospital Course: Acute encephalopathy: Suspect related to alcohol and cocaine withdrawal. Improving-episodes of delirium/confusion have decreased markedly-he is in pretty awake and alert this morning. On low-dose Risperdal and Klonopin. Unfortunately has become very deconditioned, oral intake is not very reliable due to episodes of delirium. Plans are to discharge to SNF in the next few days. Note seen by psychiatry again on 12/27 he does not have decisional capacity with respect to be discharged to group home-but is agreeable to go to SNF for rehab  Hypotension: Resolved with IV fluid bolus. Suspect this was secondary to dehydration due to poor oral intake.  Acute kidney  injury: Resolved, likely mild prerenal azotemia due to dehydration.  Hypokalemia/hypomagnesemia: Repleted. Recheck periodically while at SNF.  Paroxysmal atrial fibrillation: Back in sinus rhythm. Now on oral Lopressor, not a anticoagulation candidate given history of alcoholism/cocaine use/prior history of hemorrhagic CVA/severe deconditioning and risk of falls.  Mild Sinus Tachycardia: Observed earlier on 12/29-resolved.CXR neg for PNA, Lower ext doppler neg for DVT. TSH within normal limits.No clinical indication on infection-is afebrile and is actually clinically improving. Suspect that is mostly from deconditioning-tachycardia is mostly when he is awake/moves around-at night heart rate seems to be ok.  Hypertension: Controlled-all antihypertensives were discontinued on 12/31 due to hypotension-but now BP stable and tolerating metoprolol today. Follow.  Subacute/chronic subdural hematoma: Per CT report-this appears stable and chronic.  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 CT head on 12/22 showed no hydrocephalus-VP shunt in stable position.  Prominent ascending thoracic aorta: Measures 44 cm. Recommend annual imaging with CT angiogram or MRI. This will be deferred to his outpatient M.D.  Malnutrition of moderate degree: Continue supplements. Appears very cachectic.   Procedures/Studies: TTE 12/20>>EF 65-70%, grade 1 diastolic dysfunction  Discharge Diagnoses:  Principal Problem:   Alcohol withdrawal (HCC) Active Problems:   HTN (hypertension)   Altered mental status   Cocaine use   Lactic acidosis   Hyperkalemia   Lesion of hard palate   Malnutrition of moderate degree   Discharge Instructions:  Activity:  As tolerated with Full fall precautions use walker/cane & assistance as needed  Discharge Instructions    Diet - low sodium  heart healthy    Complete by:  As directed  Increase activity slowly    Complete by:  As directed      Allergies as of 04/08/2016      Reactions   Aspirin Other (See Comments)   Stomach ulcer      Medication List    STOP taking these medications   lisinopril 20 MG tablet Commonly known as:  PRINIVIL,ZESTRIL     TAKE these medications   clonazePAM 0.5 MG tablet Commonly known as:  KLONOPIN Take 1 tablet (0.5 mg total) by mouth at bedtime.   clonazePAM 0.5 MG tablet Commonly known as:  KLONOPIN Take 1 tablet (0.5 mg total) by mouth 3 (three) times daily as needed (anxiety/agitation/sedation).   feeding supplement (ENSURE ENLIVE) Liqd Take 237 mLs by mouth 2 (two) times daily between meals.   metoprolol 50 MG tablet Commonly known as:  LOPRESSOR Take 1 tablet (50 mg total) by mouth 2 (two) times daily.   risperiDONE 0.5 MG tablet Commonly known as:  RISPERDAL Take 1 tablet (0.5 mg total) by mouth at bedtime.      Follow-up Information    [facility] Follow up.   Why:  Please call for an appointment. Contact information: [address] [place] [address] [phone]         Allergies  Allergen Reactions  . Aspirin Other (See Comments)    Stomach ulcer    Consultations:   None  Other Procedures/Studies: Dg Chest 2 View  Result Date: 03/25/2016 CLINICAL DATA:  Syncope.  Ibuprofen overdose. EXAM: CHEST  2 VIEW COMPARISON:  11/06/2010. FINDINGS: Normal sized heart. Tortuous aorta. Clear lungs with normal vascularity. Right ventriculoperitoneal shunt tube. Unremarkable bones. IMPRESSION: No acute abnormality. Electronically Signed   By: Beckie Salts M.D.   On: 03/25/2016 16:28   Ct Head Wo Contrast  Result Date: 03/28/2016 CLINICAL DATA:  Initial evaluation for acute altered mental status, fall out of that. EXAM: CT HEAD WITHOUT CONTRAST TECHNIQUE: Contiguous axial images were obtained from the base of  the skull through the vertex without intravenous contrast. COMPARISON:  Comparison made with prior CT from 03/25/2016. FINDINGS: Brain: Cerebral atrophy with chronic microvascular ischemic disease noted, stable. Right parietal approach VP shunt catheter remains in place with tip at the left basal ganglia, stable. Ventricular size is unchanged without evidence for hydrocephalus. Streak artifact from surgical clips from prior aneurysm repair again seen. No evidence for acute intracranial hemorrhage. No findings to suggest acute large vessel territory infarct. No mass lesion, midline shift, or mass effect. There is a left holo hemispheric subdural collection overlying the left cerebral hemisphere. This measures up to 4 mm in maximal thickness. Collection is largely I so to low-density as compared to the adjacent brain, compatible with a sub acute collection. No appreciable there is trace 3 mm left-to-right shift. Upon review of prior CT of from 03/25/2016, this collection appears to have been present on previous exam, although extremely difficult to visualize on that study, as this collection was only trace in size on that exam. Trace left-to-right shift is not significantly changed. No hyperdense component within this collection to suggest that this has acutely bled with recent fall. This collection is suspected to at least in part be related to the patient's shunt. No other extra-axial fluid collection. Vascular: No hyperdense vessel, although evaluation limited by streak artifact from aneurysm clip. Skull: Scalp soft tissues demonstrate no acute abnormality. Calvarium unchanged. Postoperative changes from prior right pterional craniotomy again seen. Sinuses/Orbits: Globes and orbital soft  tissues within normal limits. Scattered mucosal thickening within the ethmoidal air cells and right maxillary sinus. Paranasal sinuses are otherwise clear. No mastoid effusion. IMPRESSION: 1. Left holo hemispheric subdural  collection measuring up to 4 mm as above, subacute to chronic in appearance. Although not mentioned previously, this collection was likely present on previous exam from 03/25/2016, although extremely difficult to visualize on that study as this was only trace in size at that time. The apparent interval increase in size suspected to be related to shunting. No internal hyperdensity to suggest that this has acutely bled with recent fall. 2. Trace 2-3 mm of left-to-right shift, relatively stable. 3. No other acute intracranial process. 4. 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 5. Right parietal approach VP shunt in stable position. No hydrocephalus. Electronically Signed   By: Rise Mu M.D.   On: 03/28/2016 01:59   Ct Head Wo Contrast  Result Date: 03/25/2016 CLINICAL DATA:  61 year old male with a history of altered mental status EXAM: CT HEAD WITHOUT CONTRAST TECHNIQUE: Contiguous axial images were obtained from the base of the skull through the vertex without intravenous contrast. COMPARISON:  07/31/2014, 11/06/2010 FINDINGS: Brain: No acute intracranial hemorrhage. No midline shift or mass effect. Unremarkable appearance of the ventricular system. 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 Skull: Surgical changes of right pterional craniotomy for surgical clipping of aneurysm. No acute bony abnormality. Right posterior parietal approach ventriculostomy terminates to the left of midline. Sinuses/Orbits: No significant sinus disease. Other: None IMPRESSION: No CT evidence of acute intracranial abnormality. 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 Signed, Yvone Neu. Loreta Ave, DO [facility]  [facility] Radiology Electronically Signed   By: Gilmer Mor D.O.   On: 03/25/2016 20:30   Ct Chest Wo Contrast  Result Date: 03/26/2016 CLINICAL DATA:  Shortness of breath. Concern for aspiration pneumonitis EXAM: CT CHEST WITHOUT CONTRAST TECHNIQUE: Multidetector CT imaging of the chest was performed following the standard protocol without IV contrast. COMPARISON:  Chest radiograph March 25, 2016 FINDINGS: Cardiovascular: The ascending thoracic aortic diameter is measured at 4.0 x 4.0 cm, measured on axial slice 59 series 201. The thoracic aorta is somewhat ectatic. There are scattered foci of atherosclerotic calcification in the aorta. The visualized great vessels appear unremarkable on this noncontrast enhanced study. There is slight pericardial thickening without pericardial effusion evident. Mediastinum/Nodes: Thyroid appears unremarkable. There is no evident thoracic adenopathy. Lungs/Pleura: There is mild scarring in the lung apices with slight bullous disease in the extreme apices bilaterally. There are scattered small bullae throughout the lungs bilaterally. There is bibasilar atelectatic change, slightly more on the right than on the left. There is no frank edema or consolidation. There is no pleural effusion or pleural thickening evident. Upper Abdomen: Visualized upper abdominal structures appear unremarkable. Musculoskeletal: There are no blastic or lytic bone lesions. IMPRESSION: Bibasilar atelectatic change, slightly more on the right than on the left, likely due to dependent positioning. No edema or consolidation. No findings felt to represent aspiration pneumonitis. No adenopathy. Prominence of the ascending thoracic aorta with a measured diameter 4.0 x 4.0 cm. Recommend annual imaging followup by CTA or MRA. This recommendation follows 2010 ACCF/AHA/AATS/ACR/ASA/SCA/SCAI/SIR/STS/SVM Guidelines for the Diagnosis and Management of Patients with Thoracic Aortic Disease. Circulation. 2010;  121: R604-V409. Thoracic aorta somewhat ectatic  with scattered foci of atherosclerotic calcification. Borderline pericardial thickening without pericardial effusion. Electronically Signed   By: Bretta Bang III M.D.   On: 03/26/2016 10:48   Dg Chest Port 1 View  Result Date: 04/04/2016 CLINICAL DATA:  Cough beginning this morning. EXAM: PORTABLE CHEST 1 VIEW COMPARISON:  03/25/2016 FINDINGS: Cardiac silhouette is normal in size. The aorta is uncoiled and tortuous. No mediastinal or hilar masses. No evidence of adenopathy. Clear lungs.  Lungs are mildly hyperexpanded. No pleural effusion or pneumothorax. Skeletal structures are intact. Right anterior chest wall ventriculostomy catheter is intact. IMPRESSION: No acute cardiopulmonary disease. Electronically Signed   By: Amie Portland M.D.   On: 04/04/2016 10:32      TODAY-DAY OF DISCHARGE:  Subjective:   [name] today has no headache,no chest abdominal pain,no new weakness tingling or numbness, He is much more awake and alert and following commands. He is still very deconditioned and cachectic.  Objective:   Blood pressure (!) 114/92, pulse 73, temperature 98.3 F (36.8 C), temperature source Oral, resp. rate 18, height 5\' 9"  (1.753 m), weight 49.9 kg (110 lb), SpO2 100 %.  Intake/Output Summary (Last 24 hours) at 04/08/16 1026 Last data filed at 04/08/16 0600  Gross per 24 hour  Intake              960 ml  Output              925 ml  Net               35 ml   Filed Weights   04/04/16 2053 04/07/16 0351 04/07/16 2151  Weight: 56 kg (123 lb 7.3 oz) 50.3 kg (111 lb) 49.9 kg (110 lb)    Exam: Awake Alert, No new F.N deficits, Normal affect [place].AT,PERRAL Supple Neck,No JVD, No cervical lymphadenopathy appriciated.  Symmetrical Chest wall movement, Good air movement bilaterally, CTAB RRR,No Gallops,Rubs or new Murmurs, No Parasternal Heave +ve B.Sounds, Abd Soft, Non tender, No organomegaly appriciated, No rebound -guarding  or rigidity. No Cyanosis, Clubbing or edema, No new Rash or bruise   PERTINENT RADIOLOGIC STUDIES: Dg Chest 2 View  Result Date: 03/25/2016 CLINICAL DATA:  Syncope.  Ibuprofen overdose. EXAM: CHEST  2 VIEW COMPARISON:  11/06/2010. FINDINGS: Normal sized heart. Tortuous aorta. Clear lungs with normal vascularity. Right ventriculoperitoneal shunt tube. Unremarkable bones. IMPRESSION: No acute abnormality. Electronically Signed   By: Beckie Salts M.D.   On: 03/25/2016 16:28   Ct Head Wo Contrast  Result Date: 03/28/2016 CLINICAL DATA:  Initial evaluation for acute altered mental status, fall out of that. EXAM: CT HEAD WITHOUT CONTRAST TECHNIQUE: Contiguous axial images were obtained from the base of the skull through the vertex without intravenous contrast. COMPARISON:  Comparison made with prior CT from 03/25/2016. FINDINGS: Brain: Cerebral atrophy with chronic microvascular ischemic disease noted, stable. Right parietal approach VP shunt catheter remains in place with tip at the left basal ganglia, stable. Ventricular size is unchanged without evidence for hydrocephalus. Streak artifact from surgical clips from prior aneurysm repair again seen. No evidence for acute intracranial hemorrhage. No findings to suggest acute large vessel territory infarct. No mass lesion, midline shift, or mass effect. There is a left holo hemispheric subdural collection overlying the left cerebral hemisphere. This measures up to 4 mm in maximal thickness. Collection is largely I so to low-density as compared to the adjacent brain, compatible with a sub acute collection. No appreciable there is trace 3 mm left-to-right shift. Upon review of  prior CT of from 03/25/2016, this collection appears to have been present on previous exam, although extremely difficult to visualize on that study, as this collection was only trace in size on that exam. Trace left-to-right shift is not significantly changed. No hyperdense component within  this collection to suggest that this has acutely bled with recent fall. This collection is suspected to at least in part be related to the patient's shunt. No other extra-axial fluid collection. Vascular: No hyperdense vessel, although evaluation limited by streak artifact from aneurysm clip. Skull: Scalp soft tissues demonstrate no acute abnormality. Calvarium unchanged. Postoperative changes from prior right pterional craniotomy again seen. Sinuses/Orbits: Globes and orbital soft tissues within normal limits. Scattered mucosal thickening within the ethmoidal air cells and right maxillary sinus. Paranasal sinuses are otherwise clear. No mastoid effusion. IMPRESSION: 1. Left holo hemispheric subdural collection measuring up to 4 mm as above, subacute to chronic in appearance. Although not mentioned previously, this collection was likely present on previous exam from 03/25/2016, although extremely difficult to visualize on that study as this was only trace in size at that time. The apparent interval increase in size suspected to be related to shunting. No internal hyperdensity to suggest that this has acutely bled with recent fall. 2. Trace 2-3 mm of left-to-right shift, relatively stable. 3. No other acute intracranial process. 4. 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 5. Right parietal approach VP shunt in stable position. No hydrocephalus. Electronically Signed   By: Rise Mu M.D.   On: 03/28/2016 01:59   Ct Head Wo Contrast  Result Date: 03/25/2016 CLINICAL DATA:  61 year old male with a history of altered mental status EXAM: CT HEAD WITHOUT CONTRAST TECHNIQUE: Contiguous axial images were obtained from the base of the skull through the vertex without intravenous contrast. COMPARISON:  07/31/2014, 11/06/2010 FINDINGS: Brain: No acute intracranial hemorrhage. No midline shift or mass effect. Unremarkable appearance  of the ventricular system. 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 Skull: Surgical changes of right pterional craniotomy for surgical clipping of aneurysm. No acute bony abnormality. Right posterior parietal approach ventriculostomy terminates to the left of midline. Sinuses/Orbits: No significant sinus disease. Other: None IMPRESSION: No CT evidence of acute intracranial abnormality. 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 Signed, Yvone Neu. Loreta Ave, DO [facility] Rad[facility] Electronically Signed   By: Gilmer Mor D.O.   On: 03/25/2016 20:30   Ct Chest Wo Contrast  Result Date: 03/26/2016 CLINICAL DATA:  Shortness of breath. Concern for aspiration pneumonitis EXAM: CT CHEST WITHOUT CONTRAST TECHNIQUE: Multidetector CT imaging of the chest was performed following the standard protocol without IV contrast. COMPARISON:  Chest radiograph March 25, 2016 FINDINGS: Cardiovascular: The ascending thoracic aortic diameter is measured at 4.0 x 4.0 cm, measured on axial slice 59 series 201. The thoracic aorta is somewhat ectatic. There are scattered foci of atherosclerotic calcification in the aorta. The visualized great vessels appear unremarkable on this noncontrast enhanced study. There is slight pericardial thickening without pericardial effusion evident. Mediastinum/Nodes: Thyroid appears unremarkable. There is no evident thoracic adenopathy. Lungs/Pleura: There is mild scarring in the lung apices with slight bullous disease in the extreme apices bilaterally. There are scattered small bullae throughout the lungs bilaterally. There is bibasilar atelectatic change, slightly more on the right than on  the left. There is no frank edema or consolidation. There is no pleural  effusion or pleural thickening evident. Upper Abdomen: Visualized upper abdominal structures appear unremarkable. Musculoskeletal: There are no blastic or lytic bone lesions. IMPRESSION: Bibasilar atelectatic change, slightly more on the right than on the left, likely due to dependent positioning. No edema or consolidation. No findings felt to represent aspiration pneumonitis. No adenopathy. Prominence of the ascending thoracic aorta with a measured diameter 4.0 x 4.0 cm. Recommend annual imaging followup by CTA or MRA. This recommendation follows 2010 ACCF/AHA/AATS/ACR/ASA/SCA/SCAI/SIR/STS/SVM Guidelines for the Diagnosis and Management of Patients with Thoracic Aortic Disease. Circulation. 2010; 121: Z610-R604: [id number]. Thoracic aorta somewhat ectatic with scattered foci of atherosclerotic calcification. Borderline pericardial thickening without pericardial effusion. Electronically Signed   By: Bretta BangWilliam  Woodruff III M.D.   On: 03/26/2016 10:48   Dg Chest Port 1 View  Result Date: 04/04/2016 CLINICAL DATA:  Cough beginning this morning. EXAM: PORTABLE CHEST 1 VIEW COMPARISON:  03/25/2016 FINDINGS: Cardiac silhouette is normal in size. The aorta is uncoiled and tortuous. No mediastinal or hilar masses. No evidence of adenopathy. Clear lungs.  Lungs are mildly hyperexpanded. No pleural effusion or pneumothorax. Skeletal structures are intact. Right anterior chest wall ventriculostomy catheter is intact. IMPRESSION: No acute cardiopulmonary disease. Electronically Signed   By: Amie Portlandavid  Ormond M.D.   On: 04/04/2016 10:32     PERTINENT LAB RESULTS: CBC:  Recent Labs  04/06/16 1358  WBC 11.7*  HGB 15.1  HCT 43.8  PLT 383   CMET CMP     Component Value Date/Time   NA 142 04/07/2016 0802   K 4.0 04/07/2016 0802   CL 111 04/07/2016 0802   CO2 28 04/07/2016 0802   GLUCOSE 108 (H) 04/07/2016 0802   BUN 25 (H) 04/07/2016 0802   CREATININE 1.20 04/07/2016 0802   CALCIUM 8.6 (L) 04/07/2016 0802   PROT 6.4  (L) 03/30/2016 0232   ALBUMIN 3.0 (L) 03/30/2016 0232   AST 14 (L) 03/30/2016 0232   ALT 11 (L) 03/30/2016 0232   ALKPHOS 61 03/30/2016 0232   BILITOT 0.9 03/30/2016 0232   GFRNONAA >60 04/07/2016 0802   GFRAA >60 04/07/2016 0802    GFR Estimated Creatinine Clearance: 45.6 mL/min (by C-G formula based on SCr of 1.2 mg/dL). No results for input(s): LIPASE, AMYLASE in the last 72 hours. No results for input(s): CKTOTAL, CKMB, CKMBINDEX, TROPONINI in the last 72 hours. Invalid input(s): POCBNP No results for input(s): DDIMER in the last 72 hours. No results for input(s): HGBA1C in the last 72 hours. No results for input(s): CHOL, HDL, LDLCALC, TRIG, CHOLHDL, LDLDIRECT in the last 72 hours.  Recent Labs  04/07/16 1133  TSH 0.726   No results for input(s): VITAMINB12, FOLATE, FERRITIN, TIBC, IRON, RETICCTPCT in the last 72 hours. Coags: No results for input(s): INR in the last 72 hours.  Invalid input(s): PT Microbiology: No results found for this or any previous visit (from the past 240 hour(s)).  FURTHER DISCHARGE INSTRUCTIONS:  Get Medicines reviewed and adjusted: Please take all your medications with you for your next visit with your Primary MD  Laboratory/radiological data: Please request your Primary MD to go over all hospital tests and procedure/radiological results at the follow up, please ask your Primary MD to get all Hospital records sent to his/her office.  In some cases, they will be blood work, cultures and biopsy results pending at the time of your discharge. Please request that your primary care M.D. goes through all the  records of your hospital data and follows up on these results.  Also Note the following: If you experience worsening of your admission symptoms, develop shortness of breath, life threatening emergency, suicidal or homicidal thoughts you must seek medical attention immediately by calling 911 or calling your MD immediately  if symptoms less  severe.  You must read complete instructions/literature along with all the possible adverse reactions/side effects for all the Medicines you take and that have been prescribed to you. Take any new Medicines after you have completely understood and accpet all the possible adverse reactions/side effects.   Do not drive when taking Pain medications or sleeping medications (Benzodaizepines)  Do not take more than prescribed Pain, Sleep and Anxiety Medications. It is not advisable to combine anxiety,sleep and pain medications without talking with your primary care practitioner  Special Instructions: If you have smoked or chewed Tobacco  in the last 2 yrs please stop smoking, stop any regular Alcohol  and or any Recreational drug use.  Wear Seat belts while driving.  Please note: You were cared for by a hospitalist during your hospital stay. Once you are discharged, your primary care physician will handle any further medical issues. Please note that NO REFILLS for any discharge medications will be authorized once you are discharged, as it is imperative that you return to your primary care physician (or establish a relationship with a primary care physician if you do not have one) for your post hospital discharge needs so that they can reassess your need for medications and monitor your lab values.  Total Time spent coordinating discharge including counseling, education and face to face time equals 45 minutes.  SignedJeoffrey Massed 04/08/2016 10:26 AM

## 2016-04-08 NOTE — Clinical Social Work Note (Signed)
CSW received consult for SNF placement. Patient advised nurse case manager that his brother could be contacted regarding discharge plan. Talked with patient's brother, Leonard Forbes, Leonard Forbes by [phone]([phone]) and he is in agreement with ST rehab. Full assessment to follow. Facility search initiated and brother will be contacted with facility responses.  Genelle BalVanessa Jaray Boliver, MSW, LCSW Licensed Clinical Social Worker Clinical Social Work Department [facility] [phone]

## 2016-04-08 NOTE — Care Management Important Message (Signed)
Important Message  Patient Details  Name: Leonard Forbes MRN: [id number] Date of Birth: 1955-09-24   Medicare Important Message Given:  Yes    Nyna Chilton Stefan ChurchBratton 04/08/2016, 4:34 PM

## 2016-04-08 NOTE — NC FL2 (Signed)
[place] MEDICAID FL2 LEVEL OF CARE SCREENING TOOL     IDENTIFICATION  Patient Name: Leonard Forbes Birthdate: [birth year] Sex: male Admission Date (Current Location): 03/25/2016  [facility]   Facility and Address:  [facility], [address], [place], [place] [address]      Provider Number: [id number]  Attending Physician Name and Address:  Maretta BeesShanker M Ghimire, MD  Relative Name and Phone Number:  Kathlyn SacramentoStevens,Gloria-Friend, [phone] (h), [phone] (mobile);  Cherly HensenClayton Sedler,Jr- Brother; [phone] (h), [phone] (mobile)    Current Level of Care: Other (Comment) Recommended Level of Care: Skilled Nursing Facility Prior Approval Number:    Date Approved/Denied:   PASRR Number: [id number]  Discharge Plan: SNF    Current Diagnoses: Patient Active Problem List   Diagnosis Date Noted  . Malnutrition of moderate degree 04/02/2016  . Alcohol withdrawal (HCC) 03/26/2016  . Cocaine use 03/26/2016  . Lactic acidosis 03/26/2016  . Hyperkalemia 03/26/2016  . Lesion of hard palate 03/26/2016  . Altered mental status 03/25/2016  . HTN (hypertension) 07/07/2014  . Localized enlarged lymph nodes 07/07/2014  . Headache disorder 06/22/2014  . History of ruptured arterial aneurysm 06/22/2014    Orientation RESPIRATION BLADDER Height & Weight     Self, Place  Normal Continent (Condom cath) Weight: 110 lb (49.9 kg) Height:  5\' 9"  (175.3 cm)  BEHAVIORAL SYMPTOMS/MOOD NEUROLOGICAL BOWEL NUTRITION STATUS      Continent Diet (Low Sodium - Heart Healthy)  AMBULATORY STATUS COMMUNICATION OF NEEDS Skin   Limited Assist Verbally Normal                       Personal Care Assistance Level of Assistance  Bathing, Feeding, Dressing Bathing Assistance: Limited assistance Feeding assistance: Independent Dressing Assistance: Limited assistance     Functional Limitations Info  Sight, Hearing, Speech Sight Info:  Adequate Hearing Info: Adequate Speech Info: Adequate    SPECIAL CARE FACTORS FREQUENCY  PT (By licensed PT)     PT Frequency: Evaluated 12/22 and a minimum of 3X per week therapy recommended              Contractures Contractures Info: Not present    Additional Factors Info  Code Status, Allergies Code Status Info: Full Allergies Info: Aspirin           Current Medications (04/08/2016):  This is the current hospital active medication list Current Facility-Administered Medications  Medication Dose Route Frequency Provider Last Rate Last Dose  . 0.9 %  sodium chloride infusion   Intravenous Continuous Maretta BeesShanker M Ghimire, MD 40 mL/hr at 04/08/16 0005    . clonazePAM (KLONOPIN) tablet 0.5 mg  0.5 mg Oral QHS Maretta BeesShanker M Ghimire, MD   0.5 mg at 04/07/16 2154  . clonazePAM (KLONOPIN) tablet 0.5 mg  0.5 mg Oral TID PRN Maretta BeesShanker M Ghimire, MD   0.5 mg at 04/06/16 0508  . feeding supplement (ENSURE ENLIVE) (ENSURE ENLIVE) liquid 237 mL  237 mL Oral BID BM Dorothea OgleIskra M Myers, MD   237 mL at 04/08/16 1000  . guaiFENesin (ROBITUSSIN) 100 MG/5ML solution 100 mg  5 mL Oral Q4H PRN Maretta BeesShanker M Ghimire, MD   100 mg at 04/05/16 0646  . hydrALAZINE (APRESOLINE) injection 10 mg  10 mg Intravenous Q6H PRN Nishant Dhungel, MD   10 mg at 04/04/16 0757  . LORazepam (ATIVAN) injection 0.5-1 mg  0.5-1 mg Intravenous Q6H PRN Maretta BeesShanker M Ghimire, MD      .  metoprolol (LOPRESSOR) tablet 50 mg  50 mg Oral BID Maretta Bees, MD      . ondansetron [facility]) tablet 4 mg  4 mg Oral Q6H PRN Bobette Mo, MD       Or  . ondansetron [facility]) injection 4 mg  4 mg Intravenous Q6H PRN Bobette Mo, MD      . risperiDONE (RISPERDAL) tablet 0.5 mg  0.5 mg Oral QHS Maretta Bees, MD   0.5 mg at 04/07/16 2154     Discharge Medications: Please see discharge summary for a list of discharge medications.  Relevant Imaging Results:  Relevant Lab Results:   Additional  Information ss#[id number].  Cristobal Goldmann, LCSW

## 2016-04-09 LAB — GLUCOSE, CAPILLARY: Glucose-Capillary: 78 mg/dL (ref 65–99)

## 2016-04-09 NOTE — Progress Notes (Signed)
Seen and examined Continues to slowly improve-awake, alert and following commands.  Vital signs are stable  Lungs-clear to exam Abdomen-soft nontender CVS-S1-S2 regular no murmurs Neuro-nonfocal  Remain stable for discharge to SNF. Please see discharge summary dictated yesterday.

## 2016-04-09 NOTE — Clinical Social Work Note (Signed)
Clinical Social Work Assessment  Patient Details  Name: Leonard HartshornRonald Tangney MRN: [id number] Date of Birth: [birth year]  Date of referral:  04/08/16               Reason for consult:  Facility Placement                Permission sought to share information with:  Family Supports Permission granted to share information::  Yes, Verbal Permission Granted  Name::     Lucretia KernClayton Glauser, Montez HagemanJr.  Agency::     Relationship::  Brother  Contact Information:  [phone]  Housing/Transportation Living arrangements for the past 2 months:  Single Family Home Source of Information:  Other (Comment Required) (Brother EconomistClayton) Patient Interpreter Needed:  None Criminal Activity/Legal Involvement Pertinent to Current Situation/Hospitalization:  No - Comment as needed Significant Relationships:  Siblings Lives with:  Self Do you feel safe going back to the place where you live?  No (Brother in agreement that patient needs rehab before returning home.) Need for family participation in patient care:  Yes (Comment)  Care giving concerns:  Mr. Paris LoreWiley advised nurse case manager C.  Royal on 1/2 that his brother could be contacted regarding SNF placement and patient was given facility list by case manager. Patient's brother in agreement with ST rehab as patient lives alone.  Social Worker assessment / plan:  CSW talked with patient's brother, Cherly HensenClayton Buttery, Jr. On 1/2 and 1/3 by phone and with patient at the bedside on 1/3 regarding discharge disposition and recommendation of SNF placement. Brother in agreement with SNF and provided facility preference. Mr. Ebony CargoClayton advised CSW that he visited his brother on 1/2 and he was better mentally. During CSW's visit with patient on 1/3, he was sitting up in bed and was awake, alert, but lethargic and slow to respond. Patient was advised of facility chosen by brother and did not express any objections to the facility choice.  Employment status:  Disabled (Comment on whether or not  currently receiving Disability) Insurance information:  Medicare PT Recommendations:  Skilled Nursing Facility Information / Referral to community resources:  Skilled Nursing Facility (Talked with brother by phone regarding skilled facility choices)  Patient/Family's Response to care:  No concerns expressed by brother or patient regarding Mr. Oda KiltsWiley's care during hospitalization.  Patient/Family's Understanding of and Emotional Response to Diagnosis, Current Treatment, and Prognosis:  Not discussed.  Emotional Assessment Appearance:  Appears older than stated age Attitude/Demeanor/Rapport:  Lethargic Affect (typically observed):  Flat, Quiet Orientation:  Oriented to Self, Oriented to Place Alcohol / Substance use:  Tobacco Use, Alcohol Use, Illicit Drugs (Patient reported that he smokes cigarettes and drinks alcohol but he denies drug use) Psych involvement (Current and /or in the community):  Yes (Comment) (Seen by psych 12/25)  Discharge Needs  Concerns to be addressed:  Discharge Planning Concerns Readmission within the last 30 days:  No Current discharge risk:  None Barriers to Discharge:  No Barriers Identified   Cristobal GoldmannCrawford, Julann Mcgilvray Bradley, LCSW 04/09/2016, 10:30 AM

## 2016-04-09 NOTE — Progress Notes (Signed)
[facility].Belongings,cell phone sent with patient. Gagandeep Kossman, Drinda Buttsharito Joselita, RCharity fundraiser

## 2016-04-09 NOTE — Clinical Social Work Placement (Signed)
   CLINICAL SOCIAL WORK PLACEMENT  NOTE 04/09/16 - DISCHARGED TO [facility]IA AMBULANCE  Date:  04/09/2016  Patient Details  Name: Catarina HartshornRonald Nanni MRN: [id number] Date of Birth: Jul 06, 1955  Clinical Social Work is seeking post-discharge placement for this patient at the   level of care (*CSW will initial, date and re-position this form in  chart as items are completed):      Patient/family provided with [facility] Clinical Social Work Department's list of facilities offering this level of care within the geographic area requested by the patient (or if unable, by the patient's family).      Patient/family informed of their freedom to choose among providers that offer the needed level of care, that participate in Medicare, Medicaid or managed care program needed by the patient, have an available bed and are willing to accept the patient.      Patient/family informed of [facility] ownership interest in [facility] and [facility], as well as of the fact that they are under no obligation to receive care at these facilities.  PASRR submitted to EDS on       PASRR number received on       Existing PASRR number confirmed on       FL2 transmitted to all facilities in geographic area requested by pt/family on       FL2 transmitted to all facilities within larger geographic area on       Patient informed that his/her managed care company has contracts with or will negotiate with certain facilities, including the following:            Patient/family informed of bed offers received.  Patient chooses bed at       Physician recommends and patient chooses bed at      Patient to be transferred to   on  .  Patient to be transferred to facility by       Patient family notified on   of transfer.  Name of family member notified:        PHYSICIAN       Additional Comment:    _______________________________________________ Cristobal Goldmannrawford, Dontray Haberland Bradley, LCSW 04/09/2016, 10:37 AM

## 2019-11-10 ENCOUNTER — Emergency Department (HOSPITAL_COMMUNITY): Payer: Medicare Other

## 2019-11-10 ENCOUNTER — Emergency Department (HOSPITAL_COMMUNITY)
Admission: EM | Admit: 2019-11-10 | Discharge: 2019-11-12 | Disposition: A | Discharge status: Home / Self Care | Payer: Medicare Other | Attending: Emergency Medicine | Admitting: Emergency Medicine

## 2019-11-10 ENCOUNTER — Encounter (HOSPITAL_COMMUNITY): Payer: Self-pay | Admitting: Emergency Medicine

## 2019-11-10 DIAGNOSIS — F17200 Nicotine dependence, unspecified, uncomplicated: Secondary | ICD-10-CM | POA: Insufficient documentation

## 2019-11-10 DIAGNOSIS — R519 Headache, unspecified: Secondary | ICD-10-CM | POA: Insufficient documentation

## 2019-11-10 DIAGNOSIS — Z8673 Personal history of transient ischemic attack (TIA), and cerebral infarction without residual deficits: Secondary | ICD-10-CM | POA: Insufficient documentation

## 2019-11-10 DIAGNOSIS — R4189 Other symptoms and signs involving cognitive functions and awareness: Secondary | ICD-10-CM

## 2019-11-10 DIAGNOSIS — I10 Essential (primary) hypertension: Secondary | ICD-10-CM | POA: Diagnosis not present

## 2019-11-10 DIAGNOSIS — Z20822 Contact with and (suspected) exposure to covid-19: Secondary | ICD-10-CM | POA: Insufficient documentation

## 2019-11-10 DIAGNOSIS — Z79899 Other long term (current) drug therapy: Secondary | ICD-10-CM | POA: Diagnosis not present

## 2019-11-10 DIAGNOSIS — R4181 Age-related cognitive decline: Secondary | ICD-10-CM | POA: Diagnosis not present

## 2019-11-10 DIAGNOSIS — R627 Adult failure to thrive: Secondary | ICD-10-CM | POA: Diagnosis present

## 2019-11-10 LAB — CBC
HCT: 47.7 % (ref 39.0–52.0)
Hemoglobin: 15.1 g/dL (ref 13.0–17.0)
MCH: 28.7 pg (ref 26.0–34.0)
MCHC: 31.7 g/dL (ref 30.0–36.0)
MCV: 90.7 fL (ref 80.0–100.0)
Platelets: 287 10*3/uL (ref 150–400)
RBC: 5.26 MIL/uL (ref 4.22–5.81)
RDW: 15.8 % — ABNORMAL HIGH (ref 11.5–15.5)
WBC: 9.7 10*3/uL (ref 4.0–10.5)
nRBC: 0 % (ref 0.0–0.2)

## 2019-11-10 LAB — URINALYSIS, ROUTINE W REFLEX MICROSCOPIC
Bacteria, UA: NONE SEEN
Bilirubin Urine: NEGATIVE
Glucose, UA: NEGATIVE mg/dL
Hgb urine dipstick: NEGATIVE
Ketones, ur: 20 mg/dL — AB
Nitrite: NEGATIVE
Protein, ur: 30 mg/dL — AB
Specific Gravity, Urine: 1.03 (ref 1.005–1.030)
pH: 5 (ref 5.0–8.0)

## 2019-11-10 LAB — HEPATIC FUNCTION PANEL
ALT: 12 U/L (ref 0–44)
AST: 13 U/L — ABNORMAL LOW (ref 15–41)
Albumin: 3.8 g/dL (ref 3.5–5.0)
Alkaline Phosphatase: 64 U/L (ref 38–126)
Bilirubin, Direct: 0.2 mg/dL (ref 0.0–0.2)
Indirect Bilirubin: 0.5 mg/dL (ref 0.3–0.9)
Total Bilirubin: 0.7 mg/dL (ref 0.3–1.2)
Total Protein: 7.9 g/dL (ref 6.5–8.1)

## 2019-11-10 LAB — BASIC METABOLIC PANEL
Anion gap: 12 (ref 5–15)
BUN: 17 mg/dL (ref 8–23)
CO2: 25 mmol/L (ref 22–32)
Calcium: 9.1 mg/dL (ref 8.9–10.3)
Chloride: 103 mmol/L (ref 98–111)
Creatinine, Ser: 1.09 mg/dL (ref 0.61–1.24)
GFR calc Af Amer: >60 mL/min (ref >60)
GFR calc non Af Amer: >60 mL/min (ref >60)
Glucose, Bld: 94 mg/dL (ref 70–99)
Potassium: 4.5 mmol/L (ref 3.5–5.1)
Sodium: 140 mmol/L (ref 135–145)

## 2019-11-10 MED ORDER — AMLODIPINE BESYLATE 5 MG PO TABS
5.0000 mg | ORAL_TABLET | Freq: Once | ORAL | Status: AC
  Administered 2019-11-10: 5 mg via ORAL
  Filled 2019-11-10: qty 1

## 2019-11-10 NOTE — Progress Notes (Addendum)
Per EPD, more collateral information is needed to understand the nature of why APS chose to send the pt to the ED and per the EPD very little is known of the pt's home's condition and of the pt's support system.  Per the EPD, attempts were made to contact the family but they were unsuccessful.   CSW awaiting return call from APS now.  CSW will continue to follow for D/C needs.  Dorothe Pea. Veronia Laprise  MSW, LCSW, LCAS, CCS Transitions of Care Clinical Social Worker Care Coordination Department Ph: [phone]

## 2019-11-10 NOTE — ED Notes (Signed)
Pt eating sandwich and gingerale. No distress.

## 2019-11-10 NOTE — ED Provider Notes (Signed)
[facility]-EMERGENCY DEPT Provider Note   CSN: [id number] Arrival date & time: 11/10/19  1108     History Chief Complaint  Patient presents with  . Failure To Thrive    Leonard Forbes is a 64 y.o. male.  Pt is a 63y/o male with hx of stroke, HTN, ruptured arterial aneurysm and recurrent headaches who presents today by EMS after APS called them back on concern for failure to thrive.  Pt is unable to answer a majority of questions and is hear alone.  He denies any pain or recent problems but does admit to memory problems.  He does admit to using tobacco and marijuana but denies alcohol and cocaine use recently.  Pt is not aware if he has had weight loss.  The history is provided by the patient. The history is limited by the absence of a caregiver.       Past Medical History:  Diagnosis Date  . Headache   . Headache disorder 06/22/2014  . 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 Hypertension   . Stroke [facility])     Patient Active Problem List   Diagnosis Date Noted  . Malnutrition of moderate degree 04/02/2016  . Alcohol withdrawal (HCC) 03/26/2016  . Cocaine use 03/26/2016  . Lactic acidosis 03/26/2016  . Hyperkalemia 03/26/2016  . Lesion of hard palate 03/26/2016  . Altered mental status 03/25/2016  . HTN (hypertension) 07/07/2014  . Localized enlarged lymph nodes 07/07/2014  . Headache disorder 06/22/2014  . 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 CAROTID ENDARTERECTOMY    . CRANIOTOMY     with clipping  . orif mandible Left   . VENTRICULOPERITONEAL SHUNT         Family History  Problem Relation Age of Onset  . Multiple sclerosis Mother   . Dementia Father   . Stroke Sister     Social History   Tobacco Use  . Smoking status: Current Every Day Smoker  . Smokeless tobacco: Never Used  . Tobacco comment: UTA  Substance Use Topics  . Alcohol use: Yes     Alcohol/week: 0.0 standard drinks    Comment: drinks 3-4 oz of whiskey 6x a week and 3 12oz beers a week  . Drug use: No    Home Medications Prior to Admission medications   Medication Sig Start Date End Date Taking? Authorizing Provider  clonazePAM (KLONOPIN) 0.5 MG tablet Take 1 tablet (0.5 mg total) by mouth at bedtime. 04/08/16   Ghimire, Werner Lean, MD  clonazePAM (KLONOPIN) 0.5 MG tablet Take 1 tablet (0.5 mg total) by mouth 3 (three) times daily as needed (anxiety/agitation/sedation). 04/08/16   Ghimire, Werner Lean, MD  feeding supplement, ENSURE ENLIVE, (ENSURE ENLIVE) LIQD Take 237 mLs by mouth 2 (two) times daily between meals. 04/08/16   Ghimire, Werner Lean, MD  metoprolol (LOPRESSOR) 50 MG tablet Take 1 tablet (50 mg total) by mouth 2 (two) times daily. 04/08/16   Ghimire, Werner Lean, MD  risperiDONE (RISPERDAL) 0.5 MG tablet Take 1 tablet (0.5 mg total) by mouth at bedtime. 04/08/16   Ghimire, Werner Lean, MD    Allergies    Aspirin  Review of Systems   Review of Systems  Unable to perform ROS: Dementia    Physical Exam Updated Vital Signs BP (!) 182/132 (BP Location: Right Arm)   Pulse 69   Temp 97.9 F (36.6 C) (Oral)   Resp 18  SpO2 95%   Physical Exam Vitals and nursing note reviewed.  Constitutional:      General: He is not in acute distress.    Appearance: He is well-developed and underweight.     Comments: Disheveled and poorly kept with dirt over the fingers  HENT:     Head: Normocephalic and atraumatic.     Right Ear: Tympanic membrane normal.     Left Ear: Tympanic membrane normal.     Nose: Nose normal.     Mouth/Throat:     Mouth: Mucous membranes are moist.  Eyes:     Conjunctiva/sclera: Conjunctivae normal.     Pupils: Pupils are equal, round, and reactive to light.  Cardiovascular:     Rate and Rhythm: Normal rate and regular rhythm.     Pulses: Normal pulses.     Heart sounds: No murmur heard.   Pulmonary:     Effort: Pulmonary effort is normal. No  respiratory distress.     Breath sounds: Normal breath sounds. No wheezing or rales.  Abdominal:     General: There is no distension.     Palpations: Abdomen is soft.     Tenderness: There is no abdominal tenderness. There is no guarding or rebound.  Musculoskeletal:        General: No tenderness. Normal range of motion.     Cervical back: Normal range of motion and neck supple.     Right lower leg: No edema.     Left lower leg: No edema.  Skin:    General: Skin is warm and dry.     Findings: No erythema or rash.  Neurological:     Mental Status: He is alert.     Cranial Nerves: No cranial nerve deficit.     Sensory: No sensory deficit.     Motor: No weakness.     Gait: Gait normal.     Comments: Oriented to person and place.  No weakness in arms or legs.  No asterixis  Psychiatric:     Comments: Calm and cooperative      ED Results / Procedures / Treatments   Labs (all labs ordered are listed, but only abnormal results are displayed) Labs Reviewed  CBC - Abnormal; Notable for the following components:      Result Value   RDW 15.8 (*)    All other components within normal limits  BASIC METABOLIC PANEL  HEPATIC FUNCTION PANEL  URINALYSIS, ROUTINE W REFLEX MICROSCOPIC    EKG None  Radiology No results found.  Procedures Procedures (including critical care time)  Medications Ordered in ED Medications - No data to display  ED Course  I have reviewed the triage vital signs and the nursing notes.  Pertinent labs & imaging results that were available during my care of the patient were reviewed by me and considered in my medical decision making (see chart for details).    MDM Rules/Calculators/A&P                         Patient is a 64 year old male presenting today by ambulance for complaint of failure to thrive.  APS called EMS to have the patient evaluated.  Nobody is present here with the patient and he provides very little information.  He states someone came  to his house today and said that he needed to be evaluated.  Patient has no complaints at this time.  He does look disheveled and dirt around his fingernails  but otherwise his clothes appear clean.  Patient is able to ambulate but does appear to have memory issues.  Unable to get a hold of anybody in his contacts 5 different numbers were called without being able to contact anyone.  Patient is hypertensive here rest of vital signs are normal and suspect that is more chronic.  He does not appear intoxicated and is awake and alert.  Patient's creatinine today is within normal limits and CBC without acute findings.  He does have a raspy cough on exam and will do a chest x-ray as well as a head CT for confusion.  However this may be his baseline but without collateral information is difficult to tell.  No APS report available and no notes are in the chart.  Will contact transitional care team for further assistance.   8:33 PM CT of head and chest without acute findings but chronic one.  Home health ordered.  Pt eating here without difficulty.  MDM Number of Diagnoses or Management Options   Amount and/or Complexity of Data Reviewed Clinical lab tests: ordered and reviewed Tests in the radiology section of CPT: ordered and reviewed Decide to obtain previous medical records or to obtain history from someone other than the patient: yes Obtain history from someone other than the patient: no Review and summarize past medical records: yes Independent visualization of images, tracings, or specimens: yes  Risk of Complications, Morbidity, and/or Mortality Presenting problems: moderate Diagnostic procedures: low Management options: low  Patient Progress Patient progress: stable   Final Clinical Impression(s) / ED Diagnoses Final diagnoses:  Cognitive decline    Rx / DC Orders ED Discharge Orders    None       Gwyneth Sprout, MD 11/10/19 2034

## 2019-11-10 NOTE — ED Triage Notes (Signed)
Per EMS, patient from home, EMS called by APS, reports patient is failure to thrive with poor PO intake. Denies N/V/D, chest pain, SOB. A&Ox4.

## 2019-11-10 NOTE — Progress Notes (Signed)
CSW received a call from the DSS/APS on-call social worker who states that:  1. EMS was likely called by pt's assigned DSS/APS worker Camilla at ph: [phone] or [phone].  2.  Shaune Pascal will be on duty to answer questions on the morning of 11/12/19.  3. Notes were not available to explain pt's situation.home's condition at this time.  EPD updated.  CSW will continue to follow for D/C needs.  Dorothe Pea. Koleman Marling  MSW, LCSW, LCAS, CCS Transitions of Care Clinical Social Worker Care Coordination Department Ph: [phone]

## 2019-11-11 ENCOUNTER — Other Ambulatory Visit: Payer: Self-pay

## 2019-11-11 LAB — SARS CORONAVIRUS 2 BY RT PCR (HOSPITAL ORDER, PERFORMED IN ~~LOC~~ HOSPITAL LAB): SARS Coronavirus 2: NEGATIVE

## 2019-11-11 MED ORDER — METOPROLOL TARTRATE 25 MG PO TABS
50.0000 mg | ORAL_TABLET | Freq: Two times a day (BID) | ORAL | Status: DC
  Administered 2019-11-11 – 2019-11-12 (×3): 50 mg via ORAL
  Filled 2019-11-11 (×3): qty 2

## 2019-11-11 MED ORDER — RISPERIDONE 0.5 MG PO TABS
0.5000 mg | ORAL_TABLET | Freq: Every day | ORAL | Status: DC
  Administered 2019-11-11: 0.5 mg via ORAL
  Filled 2019-11-11: qty 1

## 2019-11-11 NOTE — ED Notes (Signed)
Pt presented to TCU with 1 pt belonging bag, located in locker 31

## 2019-11-11 NOTE — ED Notes (Signed)
1 patient belonging bag and wallet placed in the secured locker corresponding to his room.

## 2019-11-11 NOTE — ED Notes (Addendum)
Adult protective services called for follow-up on pt. Leonard Forbes [phone].

## 2019-11-11 NOTE — Progress Notes (Addendum)
CSW reviewed chart and notes PT recommended a 24 hour facility but not a SNF.  Per chart review pt only has a payor for SNF.  Per PT, pt ambulated 100 feet without assistance and CSW leadership was consulted and was in agreement pt not appropriate for placement from the ED without the ability to private pay and states pt should consulted as to his wishes if he is alert and oriented as pt has no legal guardian.  CSW to speak to the pt.  7:07 PM CSW ,met with pt who was not sure what hospital he was in but stated his name and his current address:  [place], [place] [address]  Pt states the address listed on his facesheet was an older address where he and his GF Shella Spearing at ph: [phone] used to live.  Pt was asked if he would like placement into a SNF for short-term rehab or to an [place] for :TC and pt presented as disbelieving and emphatically stated, "No, I want to go back to my own place.    This question was asked 2 times more and pt x2 replied, "No, I want to go home", each time more emphatically than before AEB the pt becoming agitated and concerned that this question was still being asked.  Pt was asked if he wanted to return home today and pt stated he would prefer to "go home tomorrow or over the weekend sometime".  CSW asked pt if he knew what day it was and pt stated Friday.  When asked why the pt didn't want to go home tonight if D/C'd and the pt stated he had not talked to his GF Peter Congo about a plan to return home.  Pt stated Peter Congo was not aware he was D/C'ing today and wanted to call her to make preparations first.  Pt stated however he had no way to contact her as he did not know her phone number.  CSW called the RN back and per pt's RN, pt stated pt's contacts are limited to two people who are not his GF.  EPD updated and feels a safe D/C plan would be better implemented in daylight on 11/12/19 where further attempts at locating support  would be more likeley and pt could connect with his neighbors more easily as per the pt's wishes.  2nd shift ED CSW will leave handoff for 1st shift ED CSW.  CSW will continue to follow for D/C needs.  Alphonse Guild. Oriah Leinweber  MSW, LCSW, LCAS, CCS Transitions of Care Clinical Social Worker Care Coordination Department Ph: [phone]

## 2019-11-11 NOTE — Evaluation (Addendum)
Physical Therapy Evaluation Patient Details Name: Leonard Forbes MRN: [id number] DOB: 1955/11/13 Today's Date: 11/11/2019   History of Present Illness  Pt is a 64y/o male with hx of stroke, HTN, ruptured arterial aneurysm and recurrent headaches who presents 11/10/19 by EMS after APS called them back on concern for failure to thrive  Clinical Impression  The patient  Is cooperative, oriented  To self and month. Does not state where or with whom he lives.  Patient ambulated with min guard, no assistive device on unit x 100'..  Patient's BP elevated: sitting 127/117, post ambulation 145/101. Left arm dynamap. Patient had no C/O. Patient may benefit from a facility to provide 24/7 care. Pt admitted with above diagnosis.  Pt currently with functional limitations due to the deficits listed below (see PT Problem List). Pt will benefit from skilled PT to increase their independence and safety with mobility to allow discharge to the venue listed below.       Follow Up Recommendations  (facility to provide 24/7 care)    Equipment Recommendations  None recommended by PT    Recommendations for Other Services       Precautions / Restrictions Precautions Precautions: Fall Precaution Comments: incontinence      Mobility  Bed Mobility Overal bed mobility: Independent                Transfers Overall transfer level: Needs assistance Equipment used: None Transfers: Sit to/from Stand Sit to Stand: Supervision            Ambulation/Gait Ambulation/Gait assistance: Min guard Gait Distance (Feet): 100 Feet Assistive device: None Gait Pattern/deviations: Step-through pattern Gait velocity: decr   General Gait Details: shuffles as has on fipflops."I'm trying to keep my shoes on" Stairs            Wheelchair Mobility    Modified Rankin (Stroke Patients Only)       Balance Overall balance assessment: Mild deficits observed, not formally tested                                            Pertinent Vitals/Pain Pain Assessment: No/denies pain    Home Living Family/patient expects to be discharged to:: Unsure                 Additional Comments: patient states lives alone on [address]    Prior Function           Comments: unsure, APS was called for FTT     Hand Dominance        Extremity/Trunk Assessment   Upper Extremity Assessment Upper Extremity Assessment: Generalized weakness    Lower Extremity Assessment Lower Extremity Assessment: Generalized weakness    Cervical / Trunk Assessment Cervical / Trunk Assessment: Normal  Communication   Communication: Expressive difficulties;No difficulties  Cognition Arousal/Alertness: Awake/alert Behavior During Therapy: WFL for tasks assessed/performed Overall Cognitive Status: No family/caregiver present to determine baseline cognitive functioning Area of Impairment: Orientation                 Orientation Level: Place;Time;Situation             General Comments: oriented to [place]" Is that a town?", orientred to august      General Comments      Exercises     Assessment/Plan    PT Assessment Patient needs continued PT services  PT Problem  List Decreased strength;Decreased mobility;Decreased activity tolerance;Cardiopulmonary status limiting activity;Decreased balance;Decreased knowledge of use of DME       PT Treatment Interventions Gait training;Functional mobility training;Patient/family education    PT Goals (Current goals can be found in the Care Plan section)  Acute Rehab PT Goals Patient Stated Goal: agreed to ambulating PT Goal Formulation: Patient unable to participate in goal setting Time For Goal Achievement: 11/25/19 Potential to Achieve Goals: Fair    Frequency Min 2X/week   Barriers to discharge        Co-evaluation               AM-PAC PT "6 Clicks" Mobility  Outcome Measure Help needed turning from your  back to your side while in a flat bed without using bedrails?: None Help needed moving from lying on your back to sitting on the side of a flat bed without using bedrails?: None Help needed moving to and from a bed to a chair (including a wheelchair)?: None Help needed standing up from a chair using your arms (e.g., wheelchair or bedside chair)?: None Help needed to walk in hospital room?: A Little Help needed climbing 3-5 steps with a railing? : A Little 6 Click Score: 22    End of Session Equipment Utilized During Treatment: Gait belt Activity Tolerance: Patient tolerated treatment well Patient left: in bed;with call bell/phone within reach Nurse Communication: Mobility status PT Visit Diagnosis: Adult, failure to thrive (R62.7)    Time: 1600-1620 PT Time Calculation (min) (ACUTE ONLY): 20 min   Charges:   PT Evaluation $PT Eval Low Complexity: 1 Low          [name] PT Acute Rehabilitation Services Pager [phone] Office [phone]   Rada Hay 11/11/2019, 4:29 PM

## 2019-11-11 NOTE — Social Work (Signed)
TOC CSW spoke with DSS/APS-Camilla Smith [phone].  Shaune Pascal stated that pt will need an ALF placement.  Pt can not return to place of residence, due to it being an unsafe environment and he is unable to care for himself independently.    Shaune Pascal also stated to contact her for any questions or assistance.  CSW will continue to follow for D/C needs.  Greggory Safranek Tarpley-Carter, MSW, LCSW-A                  [facility] ED Transitions of CareClinical Social Worker [email or web address] [phone]

## 2019-11-11 NOTE — ED Provider Notes (Signed)
Emergency Medicine Observation Re-evaluation Note  Leonard Forbes is a 64 y.o. male, seen on rounds today.  Pt initially presented to the ED for complaints of Failure To Thrive Currently, the patient is laying comfortably. Nurses report no complaints from the patient.  Physical Exam  BP (!) 160/116   Pulse 74   Temp 98.2 F (36.8 C) (Oral)   Resp 16   SpO2 94%  Physical Exam Vitals and nursing note reviewed.  Constitutional:      Appearance: He is well-developed.  HENT:     Head: Atraumatic.  Cardiovascular:     Rate and Rhythm: Normal rate.     Pulses: Normal pulses.  Pulmonary:     Effort: Pulmonary effort is normal.  Musculoskeletal:     Cervical back: Neck supple.  Skin:    General: Skin is warm.  Neurological:     Mental Status: He is alert and oriented to person, place, and time.     ED Course / MDM  EKG:  Clinical Course as of Nov 10 733  [facility] Nov 11, 2019  0629 Chloride: 103 [MV]    Clinical Course User Index [MV] Tanda Rockers, [place]   I have reviewed the labs performed to date as well as medications administered while in observation.  Recent changes in the last 24 hours include: none. Patient is eating fine. Home meds ordered. Plan  Current plan is for social work to reassess in the morning. Patient is not under full IVC at this time.   Derwood Kaplan, MD 11/11/19 [phone]

## 2019-11-12 NOTE — Progress Notes (Signed)
CSW spoke with pt regarding return home.  CSW asked about conversation with CSW last night and pt again states he does not want to go to any facility and would like to return home. Pt alert and oriented, stated he lives alone at [address]  CSW asked about note stating he needs to contact his girlfriend and pt states he does need to "check in" with her, but she does not live with him and this does not impact his ability to return home.  CSW paged [facility] APS after hours number, spoke with Jillene Bucks and informed her that pt is declining placement and will be discharged. Werner Lean will inform pt regular caseworker, Rulon Sera.  Pt sent home by [facility] cab. Garner Nash, MSW, LCSW 11/12/2019 11:05 AM

## 2019-11-12 NOTE — ED Notes (Signed)
Pt refused breakfast tray.  

## 2019-11-12 NOTE — ED Notes (Signed)
Pt discharged home. Discharged instructions read to pt who verbalized understanding. All belongings returned to pt. Denies SI/HI, is not delusional and not responding to internal stimuli. Escorted pt to the ED exit where [facility] Cab is provided for transport home.

## 2019-11-12 NOTE — Discharge Instructions (Addendum)
Follow-up with your primary care doctor for checkup as soon as possible.

## 2019-11-12 NOTE — ED Provider Notes (Signed)
1:10 PM-patient being housed for concern of failure to thrive.  He was seen by social work for possible placement but he does not want to be placed.  Social work was able to contact outpatient social work services through [facility], in order to arrange for outpatient follow-up and interventions as needed.  Patient is being discharged with a voucher for cab.  He is apparently going to his home.  At this time he is alert and cooperative.  He states he is trying to get in touch with his girlfriend, but does not have any other concerns.   Mancel Bale, MD 11/12/19 1322

## 2019-11-12 NOTE — ED Notes (Signed)
D/C arrangement to be made today, post patient refusing SNF placement, uneventful shift otherwise for patient

## 2019-11-12 NOTE — ED Notes (Addendum)
Pt slow to get dressed, but finally was able to put his very dirty clothes back on. He stood up to get his pants on. Was able to walk to a wheel chair and get on.  [facility] called and are on the way. Pt was cooperative and seemed to understand. Verbalized the desire to go home and said that he has a key to his place. He has his wallet in his possession.

## 2019-11-13 ENCOUNTER — Encounter (HOSPITAL_COMMUNITY): Payer: Self-pay

## 2019-11-13 ENCOUNTER — Other Ambulatory Visit: Payer: Self-pay

## 2019-11-13 ENCOUNTER — Emergency Department (HOSPITAL_COMMUNITY)
Admission: EM | Admit: 2019-11-13 | Discharge: 2019-11-15 | Disposition: A | Discharge status: Home / Self Care | Payer: Medicare Other | Attending: Emergency Medicine | Admitting: Emergency Medicine

## 2019-11-13 DIAGNOSIS — Z79899 Other long term (current) drug therapy: Secondary | ICD-10-CM | POA: Insufficient documentation

## 2019-11-13 DIAGNOSIS — Z20822 Contact with and (suspected) exposure to covid-19: Secondary | ICD-10-CM | POA: Insufficient documentation

## 2019-11-13 DIAGNOSIS — R627 Adult failure to thrive: Secondary | ICD-10-CM | POA: Diagnosis not present

## 2019-11-13 DIAGNOSIS — R410 Disorientation, unspecified: Secondary | ICD-10-CM | POA: Insufficient documentation

## 2019-11-13 DIAGNOSIS — F17200 Nicotine dependence, unspecified, uncomplicated: Secondary | ICD-10-CM | POA: Insufficient documentation

## 2019-11-13 DIAGNOSIS — I10 Essential (primary) hypertension: Secondary | ICD-10-CM | POA: Insufficient documentation

## 2019-11-13 LAB — CBC WITH DIFFERENTIAL/PLATELET
Abs Immature Granulocytes: 0.02 10*3/uL (ref 0.00–0.07)
Basophils Absolute: 0 10*3/uL (ref 0.0–0.1)
Basophils Relative: 1 %
Eosinophils Absolute: 0.3 10*3/uL (ref 0.0–0.5)
Eosinophils Relative: 5 %
HCT: 44.5 % (ref 39.0–52.0)
Hemoglobin: 14.4 g/dL (ref 13.0–17.0)
Immature Granulocytes: 0 %
Lymphocytes Relative: 23 %
Lymphs Abs: 1.5 10*3/uL (ref 0.7–4.0)
MCH: 28.5 pg (ref 26.0–34.0)
MCHC: 32.4 g/dL (ref 30.0–36.0)
MCV: 88.1 fL (ref 80.0–100.0)
Monocytes Absolute: 0.8 10*3/uL (ref 0.1–1.0)
Monocytes Relative: 12 %
Neutro Abs: 3.9 10*3/uL (ref 1.7–7.7)
Neutrophils Relative %: 59 %
Platelets: 362 10*3/uL (ref 150–400)
RBC: 5.05 MIL/uL (ref 4.22–5.81)
RDW: 15.3 % (ref 11.5–15.5)
WBC: 6.5 10*3/uL (ref 4.0–10.5)
nRBC: 0 % (ref 0.0–0.2)

## 2019-11-13 LAB — COMPREHENSIVE METABOLIC PANEL
ALT: 14 U/L (ref 0–44)
AST: 13 U/L — ABNORMAL LOW (ref 15–41)
Albumin: 3.6 g/dL (ref 3.5–5.0)
Alkaline Phosphatase: 58 U/L (ref 38–126)
Anion gap: 10 (ref 5–15)
BUN: 25 mg/dL — ABNORMAL HIGH (ref 8–23)
CO2: 26 mmol/L (ref 22–32)
Calcium: 8.7 mg/dL — ABNORMAL LOW (ref 8.9–10.3)
Chloride: 104 mmol/L (ref 98–111)
Creatinine, Ser: 1.2 mg/dL (ref 0.61–1.24)
GFR calc Af Amer: >60 mL/min (ref >60)
GFR calc non Af Amer: >60 mL/min (ref >60)
Glucose, Bld: 93 mg/dL (ref 70–99)
Potassium: 3.8 mmol/L (ref 3.5–5.1)
Sodium: 140 mmol/L (ref 135–145)
Total Bilirubin: 0.8 mg/dL (ref 0.3–1.2)
Total Protein: 7.3 g/dL (ref 6.5–8.1)

## 2019-11-13 LAB — URINALYSIS, ROUTINE W REFLEX MICROSCOPIC
Bacteria, UA: NONE SEEN
Bilirubin Urine: NEGATIVE
Glucose, UA: NEGATIVE mg/dL
Hgb urine dipstick: NEGATIVE
Ketones, ur: 5 mg/dL — AB
Nitrite: NEGATIVE
Protein, ur: NEGATIVE mg/dL
Specific Gravity, Urine: 1.028 (ref 1.005–1.030)
pH: 5 (ref 5.0–8.0)

## 2019-11-13 LAB — RAPID URINE DRUG SCREEN, HOSP PERFORMED
Amphetamines: NOT DETECTED
Barbiturates: NOT DETECTED
Benzodiazepines: NOT DETECTED
Cocaine: POSITIVE — AB
Opiates: NOT DETECTED
Tetrahydrocannabinol: NOT DETECTED

## 2019-11-13 LAB — ETHANOL: Alcohol, Ethyl (B): <10 mg/dL (ref <10)

## 2019-11-13 MED ORDER — RISPERIDONE 0.5 MG PO TABS
0.5000 mg | ORAL_TABLET | Freq: Every day | ORAL | Status: DC
  Administered 2019-11-13 – 2019-11-15 (×2): 0.5 mg via ORAL
  Filled 2019-11-13 (×2): qty 1

## 2019-11-13 MED ORDER — METOPROLOL TARTRATE 25 MG PO TABS
50.0000 mg | ORAL_TABLET | Freq: Two times a day (BID) | ORAL | Status: DC
  Administered 2019-11-13 – 2019-11-15 (×3): 50 mg via ORAL
  Filled 2019-11-13 (×3): qty 2

## 2019-11-13 MED ORDER — SODIUM CHLORIDE 0.9 % IV BOLUS
1000.0000 mL | Freq: Once | INTRAVENOUS | Status: AC
  Administered 2019-11-13: 1000 mL via INTRAVENOUS

## 2019-11-13 NOTE — ED Provider Notes (Signed)
[facility]-EMERGENCY DEPT Provider Note   CSN: [id number] Arrival date & time: 11/13/19  1628     History Chief Complaint  Patient presents with  . Altered Mental Status    Leonard Forbes is a 64 y.o. male.  HPI    64 year old male comes into the ER with chief complaint of altered mental status. Patient is AOx3.  According to EMS, patient is coming from his "Cousin's house" at the request of the cousin who stated that patient wandered up to the home and appeared confused to them.  Patient unable to tell EMS how he got to the house.  Patient is oriented x3.  He reports that he was brought here via ambulance.  He is unsure why he was brought to the hospital via ambulance.  He has no complaints from his side.  He denies any headaches, neck pain, chest pain, cough, shortness of breath, abdominal pain, UTI-like symptoms, back pain.  He denies trauma.  He denies substance abuse.  Patient was seen in the ER few days ago and had social worker consult. It appears that patient did not want to be placed and was discharged.   Past Medical History:  Diagnosis Date  . Headache   . Headache disorder 06/22/2014  . 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 Hypertension   . Stroke [facility])     Patient Active Problem List   Diagnosis Date Noted  . Malnutrition of moderate degree 04/02/2016  . Alcohol withdrawal (HCC) 03/26/2016  . Cocaine use 03/26/2016  . Lactic acidosis 03/26/2016  . Hyperkalemia 03/26/2016  . Lesion of hard palate 03/26/2016  . Altered mental status 03/25/2016  . HTN (hypertension) 07/07/2014  . Localized enlarged lymph nodes 07/07/2014  . Headache disorder 06/22/2014  . 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 CAROTID ENDARTERECTOMY    . CRANIOTOMY     with clipping  . orif mandible Left   . VENTRICULOPERITONEAL SHUNT         Family History  Problem Relation Age  of Onset  . Multiple sclerosis Mother   . Dementia Father   . Stroke Sister     Social History   Tobacco Use  . Smoking status: Current Every Day Smoker  . Smokeless tobacco: Never Used  . Tobacco comment: UTA  Substance Use Topics  . Alcohol use: Yes    Alcohol/week: 0.0 standard drinks    Comment: drinks 3-4 oz of whiskey 6x a week and 3 12oz beers a week  . Drug use: No    Home Medications Prior to Admission medications   Medication Sig Start Date End Date Taking? Authorizing Provider  clonazePAM (KLONOPIN) 0.5 MG tablet Take 1 tablet (0.5 mg total) by mouth at bedtime. Patient not taking: Reported on 11/10/2019 04/08/16   Maretta Bees, MD  clonazePAM (KLONOPIN) 0.5 MG tablet Take 1 tablet (0.5 mg total) by mouth 3 (three) times daily as needed (anxiety/agitation/sedation). Patient not taking: Reported on 11/10/2019 04/08/16   Maretta Bees, MD  feeding supplement, ENSURE ENLIVE, (ENSURE ENLIVE) LIQD Take 237 mLs by mouth 2 (two) times daily between meals. Patient not taking: Reported on 11/10/2019 04/08/16   Maretta Bees, MD  metoprolol (LOPRESSOR) 50 MG tablet Take 1 tablet (50 mg total) by mouth 2 (two) times daily. Patient not taking: Reported on 11/10/2019 04/08/16   Maretta Bees, MD  risperiDONE (RISPERDAL) 0.5 MG tablet Take 1  tablet (0.5 mg total) by mouth at bedtime. Patient not taking: Reported on 11/10/2019 04/08/16   Maretta Bees, MD    Allergies    Aspirin  Review of Systems   Review of Systems  Constitutional: Negative for activity change.  Respiratory: Negative for cough and shortness of breath.   Cardiovascular: Negative for chest pain.  Gastrointestinal: Negative for nausea and vomiting.  Neurological: Negative for headaches.  All other systems reviewed and are negative.   Physical Exam Updated Vital Signs BP (!) 148/111   Pulse 85   Temp 98.1 F (36.7 C) (Oral)   Resp 16   SpO2 100%   Physical Exam Vitals and nursing note  reviewed.  Constitutional:      Appearance: He is well-developed.  HENT:     Head: Atraumatic.  Cardiovascular:     Rate and Rhythm: Normal rate.  Pulmonary:     Effort: Pulmonary effort is normal.  Musculoskeletal:     Cervical back: Neck supple.  Skin:    General: Skin is warm.  Neurological:     Mental Status: He is alert and oriented to person, place, and time.     ED Results / Procedures / Treatments   Labs (all labs ordered are listed, but only abnormal results are displayed) Labs Reviewed  COMPREHENSIVE METABOLIC PANEL - Abnormal; Notable for the following components:      Result Value   BUN 25 (*)    Calcium 8.7 (*)    AST 13 (*)    All other components within normal limits  URINALYSIS, ROUTINE W REFLEX MICROSCOPIC - Abnormal; Notable for the following components:   Ketones, ur 5 (*)    Leukocytes,Ua TRACE (*)    All other components within normal limits  RAPID URINE DRUG SCREEN, HOSP PERFORMED - Abnormal; Notable for the following components:   Cocaine POSITIVE (*)    All other components within normal limits  CBC WITH DIFFERENTIAL/PLATELET  ETHANOL    EKG EKG Interpretation  Date/Time:  Sunday November 13 2019 19:01:26 EDT Ventricular Rate:  87 PR Interval:    QRS Duration: 67 QT Interval:  361 QTC Calculation: 435 R Axis:   -21 Text Interpretation: Sinus rhythm Atrial premature complex Biatrial enlargement Low voltage, extremity leads Consider anterior infarct No acute changes Confirmed by ,  ([id number]) on 11/13/2019 8:10:36 PM   Radiology No results found.  Procedures Procedures (including critical care time)  Medications Ordered in ED Medications  metoprolol tartrate (LOPRESSOR) tablet 50 mg (has no administration in time range)  risperiDONE (RISPERDAL) tablet 0.5 mg (has no administration in time range)  sodium chloride 0.9 % bolus 1,000 mL (0 mLs Intravenous Stopped 11/13/19 2143)    ED Course  I have reviewed the triage vital signs  and the nursing notes.  Pertinent labs & imaging results that were available during my care of the patient were reviewed by me and considered in my medical decision making (see chart for details).    MDM Rules/Calculators/A&P                          63  year old comes in a chief complaint of confusion.  He is AAO x3, but unable to tell me the circumstances surrounding the EMS call or whether he went he was at his cousin's place.  He cannot tell me where he lives.  Although patient is oriented, having serious questions about his capacity making decision.  We will get basic labs  right now. We will consult social work for placement related issues. We will consult psychiatry for capacity evaluation.  Final Clinical Impression(s) / ED Diagnoses Final diagnoses:  Disorientation    Rx / DC Orders ED Discharge Orders    None       Derwood Kaplan, MD 11/13/19 2222

## 2019-11-13 NOTE — ED Triage Notes (Signed)
Pt arrives [facility] from a "cousins house" Per EMS: Pt seen 2 days ago for FTT and altered mental status. EMS was called today to the cousins house who stated that he " wandered" up and is very confused. Pt is unable to recall his address or how he got to his cousins house. Pt is able to recall the day of the week and year. Pt seen 2 days ago and SW attempted to place pt but pt confused. Pt was sent back to an address on [address] via cab voucher. Pt is obviously confused at this time. Pt urinating in triage sink. Pt redirectable. Pt denies medical complaints. Pt confused about situation. Pts "cousin" stated to EMS " we dont know how he got here or where he is staying. We dont know what to do with him"

## 2019-11-13 NOTE — ED Notes (Signed)
Pt ambulate in hall 99 feet. tolerated well.

## 2019-11-14 NOTE — ED Notes (Signed)
PT at bedside.

## 2019-11-14 NOTE — Progress Notes (Signed)
TOC CM spoke to ED provider and order for PT eval for SNF placement given. Isidoro Donning RN CCM, [facility] ED TOC CM [phone]

## 2019-11-14 NOTE — ED Provider Notes (Signed)
Initially seen by Dr. Rhunette Croft.  Patient was alert and oriented but appeared confused.  Patient appears medically stable.  Vital signs are normal.  Social work is attempting to find skilled nursing facility placement.   Linwood Dibbles, MD 11/14/19 1515

## 2019-11-14 NOTE — Progress Notes (Signed)
2nd shift ED CSW received a handoff from the 1st shift [facility] ED CSW/ RN CM.  CSW called Tresa Endo in admission at [facility] who states pt can arrive on the morning of 8/10 as long athe review by their admin's of pt's PT recommendation is complete.  Tresa Endo states she will pull the PT recommendation from the chart to provide to the admins and then call the 1st shift TOC [facility] ED TOC to provide pt's accepting information for transport on 8/10 in the morning.  2nd shift ED CSW will leave handoff for 1st shift ED CSW.  CSW will continue to follow for D/C needs.  Leonard Forbes. Leonard Forbes  MSW, LCSW, LCAS, CCS Transitions of Care Clinical Social Worker Care Coordination Department Ph: [phone]

## 2019-11-14 NOTE — Evaluation (Signed)
Physical Therapy Evaluation Patient Details Name: Leonard Forbes MRN: [id number] DOB: [birth year] Today's Date: 11/14/2019   History of Present Illness  Pt is a 63y/o male with hx of stroke, HTN, ruptured arterial aneurysm and recurrent headaches who presents 11/13/19 from "cousins 'House, [facility]12/[facility] after declining to go to SNF  Clinical Impression  The patient patient is A and O x 1. Patient follows directions, Ambulated x 50' with RW. This PT familiar with  patient  From previous admission last week. Pt admitted with above diagnosis. Pt currently with functional limitations due to the deficits listed below (see PT Problem List). Pt will benefit from skilled PT to increase their independence and safety with mobility to allow discharge to the venue listed below.  Patient noted with expiratory wheezes and frequent cough. Patient able to feed self, although not self limiting amount  That he puts ito his mouth.  Follow Up Recommendations SNF;Supervision/Assistance - 24 hour    Equipment Recommendations  None recommended by PT    Recommendations for Other Services       Precautions / Restrictions Precautions Precautions: Fall Precaution Comments: incontinence      Mobility  Bed Mobility Overal bed mobility: Independent Bed Mobility: Supine to Sit              Transfers Overall transfer level: Needs assistance Equipment used: Rolling walker (2 wheeled) Transfers: Sit to/from Stand Sit to Stand: Supervision         General transfer comment: at RW  Ambulation/Gait Ambulation/Gait assistance: Min guard Gait Distance (Feet): 50 Feet Assistive device: Rolling walker (2 wheeled) Gait Pattern/deviations: Step-through pattern;Decreased stride length Gait velocity: decr   General Gait Details: no external assistance required to turn and back up to stretcher.  Stairs            Wheelchair Mobility    Modified Rankin (Stroke Patients Only)        Balance Overall balance assessment: Mild deficits observed, not formally tested                                           Pertinent Vitals/Pain      Home Living Family/patient expects to be discharged to:: Unsure                 Additional Comments: plans to go to a SNF    Prior Function           Comments: unsure but had gotten to a family member's home after DC'd from ED. 8/7     Hand Dominance        Extremity/Trunk Assessment   Upper Extremity Assessment Upper Extremity Assessment: Generalized weakness    Lower Extremity Assessment Lower Extremity Assessment: Generalized weakness    Cervical / Trunk Assessment Cervical / Trunk Assessment: Normal  Communication   Communication: Expressive difficulties;No difficulties  Cognition Arousal/Alertness: Awake/alert Behavior During Therapy: WFL for tasks assessed/performed Overall Cognitive Status: No family/caregiver present to determine baseline cognitive functioning Area of Impairment: Orientation                 Orientation Level: Person;Place                    General Comments      Exercises     Assessment/Plan    PT Assessment Patient needs continued PT services  PT Problem  List Decreased strength;Decreased mobility;Decreased activity tolerance;Cardiopulmonary status limiting activity;Decreased balance;Decreased knowledge of use of DME       PT Treatment Interventions Gait training;Functional mobility training;Patient/family education    PT Goals (Current goals can be found in the Care Plan section)  Acute Rehab PT Goals Patient Stated Goal: agreed to ambulating PT Goal Formulation: Patient unable to participate in goal setting Time For Goal Achievement: 11/28/19 Potential to Achieve Goals: Fair    Frequency Min 2X/week   Barriers to discharge Decreased caregiver support      Co-evaluation               AM-PAC PT "6 Clicks" Mobility   Outcome Measure Help needed turning from your back to your side while in a flat bed without using bedrails?: None Help needed moving from lying on your back to sitting on the side of a flat bed without using bedrails?: None Help needed moving to and from a bed to a chair (including a wheelchair)?: A Little Help needed standing up from a chair using your arms (e.g., wheelchair or bedside chair)?: A Little Help needed to walk in hospital room?: A Little Help needed climbing 3-5 steps with a railing? : A Little 6 Click Score: 20    End of Session Equipment Utilized During Treatment: Gait belt Activity Tolerance: Patient tolerated treatment well (sitting on stretcher edge, RN close by) Patient left: with nursing/sitter in room Nurse Communication: Mobility status PT Visit Diagnosis: Adult, failure to thrive (R62.7)    Time: 6629-4765 PT Time Calculation (min) (ACUTE ONLY): 32 min   Charges:   PT Evaluation $PT Eval Low Complexity: 1 Low PT Treatments $Self Care/Home Management: 8-22        Leonard Forbes PT Acute Rehabilitation Services Pager [phone] Office [phone]   Leonard Forbes 11/14/2019, 3:25 PM

## 2019-11-14 NOTE — ED Notes (Signed)
PT called at 14:38

## 2019-11-14 NOTE — Social Work (Signed)
TOC CSW contacted [employer] has accepted pt.  Holden is going to attempt for a [employer].    Pt is currently awaiting PT eval.  CSW will continue to follow for dc needs.  Andras Grunewald Tarpley-Carter, MSW, LCSW-A                  [facility] ED Transitions of Care Clinical Social Worker [email or web address] [phone]

## 2019-11-14 NOTE — ED Notes (Signed)
Pt provided breakfast tray.  No other needs expressed.

## 2019-11-14 NOTE — [place] FL2 (Signed)
[place] MEDICAID FL2 LEVEL OF CARE SCREENING TOOL     IDENTIFICATION  Patient Name: Leonard Forbes Birthdate: 04-11-1955 Sex: male Admission Date (Current Location): 11/13/2019  [facility] and [place] Number:  Producer, television/film/video and Address:  [facility],  [address], [place] [address]      Provider Number: [id number]  Attending Physician Name and Address:  Default, Provider, MD  Relative Name and Phone Number:  Joycelyn Man, Son  [phone]    Current Level of Care: Hospital Recommended Level of Care: Skilled Nursing Facility Prior Approval Number:    Date Approved/Denied:   PASRR Number: [id number]  Discharge Plan: SNF    Current Diagnoses: Patient Active Problem List   Diagnosis Date Noted  . Malnutrition of moderate degree 04/02/2016  . Alcohol withdrawal (HCC) 03/26/2016  . Cocaine use 03/26/2016  . Lactic acidosis 03/26/2016  . Hyperkalemia 03/26/2016  . Lesion of hard palate 03/26/2016  . Altered mental status 03/25/2016  . HTN (hypertension) 07/07/2014  . Localized enlarged lymph nodes 07/07/2014  . Headache disorder 06/22/2014  . History of ruptured arterial aneurysm 06/22/2014    Orientation RESPIRATION BLADDER Height & Weight     Self, Time, Place, Situation  Normal Incontinent Weight:   Height:     BEHAVIORAL SYMPTOMS/MOOD NEUROLOGICAL BOWEL NUTRITION STATUS  Wanderer   Incontinent Diet (Regular)  AMBULATORY STATUS COMMUNICATION OF NEEDS Skin   Limited Assist Verbally Normal                       Personal Care Assistance Level of Assistance  Bathing, Feeding, Dressing Bathing Assistance: Limited assistance Feeding assistance: Independent Dressing Assistance: Limited assistance     Functional Limitations Info  Sight, Speech, Hearing Sight Info: Adequate Hearing Info: Adequate Speech Info: Adequate    SPECIAL CARE FACTORS FREQUENCY  PT (By licensed PT), OT (By licensed OT)     PT Frequency: 5x OT  Frequency: 5x            Contractures Contractures Info: Not present    Additional Factors Info  Code Status, Allergies Code Status Info: Full Code Allergies Info: Aspirin           Current Medications (11/14/2019):  This is the current hospital active medication list Current Facility-Administered Medications  Medication Dose Route Frequency Provider Last Rate Last Admin  . metoprolol tartrate (LOPRESSOR) tablet 50 mg  50 mg Oral BID Derwood Kaplan, MD   50 mg at 11/14/19 0928  . risperiDONE (RISPERDAL) tablet 0.5 mg  0.5 mg Oral QHS Derwood Kaplan, MD   0.5 mg at 11/13/19 2207   Current Outpatient Medications  Medication Sig Dispense Refill  . clonazePAM (KLONOPIN) 0.5 MG tablet Take 1 tablet (0.5 mg total) by mouth at bedtime. (Patient not taking: Reported on 11/10/2019) 20 tablet 0  . clonazePAM (KLONOPIN) 0.5 MG tablet Take 1 tablet (0.5 mg total) by mouth 3 (three) times daily as needed (anxiety/agitation/sedation). (Patient not taking: Reported on 11/10/2019) 30 tablet 0  . feeding supplement, ENSURE ENLIVE, (ENSURE ENLIVE) LIQD Take 237 mLs by mouth 2 (two) times daily between meals. (Patient not taking: Reported on 11/10/2019)    . metoprolol (LOPRESSOR) 50 MG tablet Take 1 tablet (50 mg total) by mouth 2 (two) times daily. (Patient not taking: Reported on 11/10/2019)    . risperiDONE (RISPERDAL) 0.5 MG tablet Take 1 tablet (0.5 mg total) by mouth at bedtime. (Patient not taking: Reported on 11/10/2019)  Discharge Medications: Please see discharge summary for a list of discharge medications.  Relevant Imaging Results:  Relevant Lab Results:   Additional Information SSN# [id number]  Lovina Zuver C Tarpley-Carter, LCSWA

## 2019-11-15 LAB — SARS CORONAVIRUS 2 BY RT PCR (HOSPITAL ORDER, PERFORMED IN ~~LOC~~ HOSPITAL LAB): SARS Coronavirus 2: NEGATIVE

## 2019-11-15 NOTE — ED Notes (Signed)
PT provided meal tray

## 2019-11-15 NOTE — Social Work (Signed)
TOC CSW has spoken with [facility] [phone].  Tresa Endo has spoke with corporate and [employer] [employer] [facility] has been accepted.    Room #:  28  Call Report:  [phone]  Pt is ready for dc.  Information for dc has been given to RN.  CSW will continue to follow for dc needs.  Rochelle Larue Tarpley-Carter, MSW, LCSW-A                  [facility] ED Transitions of CareClinical Social Worker [email or web address] [phone]

## 2019-11-15 NOTE — Progress Notes (Signed)
[facility] called, pt is number 12 on list for transport. ED RN notified. Isidoro Donning RN CCM, [facility] ED TOC CM [phone]

## 2019-11-15 NOTE — ED Notes (Signed)
PT was walk assisted into restroom, PT was provided cleansing of private area with soap and washcloths. PT was placed on condom cath by EMT & NT. PT walk assisted back to bed, clean linens applied with chuck pads.

## 2019-11-15 NOTE — ED Notes (Signed)
Assumed care at 0300, slept until end of shift, woke up with chronic coughing spell, to be discharged to [facility] SNF this morning

## 2020-02-07 ENCOUNTER — Emergency Department: Payer: Medicare Other

## 2020-02-07 ENCOUNTER — Encounter: Payer: Self-pay | Admitting: Emergency Medicine

## 2020-02-07 ENCOUNTER — Other Ambulatory Visit: Payer: Self-pay

## 2020-02-07 ENCOUNTER — Emergency Department
Admission: EM | Admit: 2020-02-07 | Discharge: 2020-02-08 | Disposition: A | Discharge status: Home / Self Care | Payer: Medicare Other | Attending: Emergency Medicine | Admitting: Emergency Medicine

## 2020-02-07 DIAGNOSIS — Z8679 Personal history of other diseases of the circulatory system: Secondary | ICD-10-CM | POA: Diagnosis not present

## 2020-02-07 DIAGNOSIS — I10 Essential (primary) hypertension: Secondary | ICD-10-CM | POA: Insufficient documentation

## 2020-02-07 DIAGNOSIS — R410 Disorientation, unspecified: Secondary | ICD-10-CM | POA: Insufficient documentation

## 2020-02-07 DIAGNOSIS — F17200 Nicotine dependence, unspecified, uncomplicated: Secondary | ICD-10-CM | POA: Insufficient documentation

## 2020-02-07 DIAGNOSIS — R55 Syncope and collapse: Secondary | ICD-10-CM | POA: Diagnosis present

## 2020-02-07 DIAGNOSIS — Z79899 Other long term (current) drug therapy: Secondary | ICD-10-CM | POA: Diagnosis not present

## 2020-02-07 HISTORY — DX: Cocaine abuse, uncomplicated: F14.10

## 2020-02-07 HISTORY — DX: Alcohol abuse, uncomplicated: F10.10

## 2020-02-07 LAB — BASIC METABOLIC PANEL
Anion gap: 11 (ref 5–15)
BUN: 23 mg/dL (ref 8–23)
CO2: 26 mmol/L (ref 22–32)
Calcium: 8.9 mg/dL (ref 8.9–10.3)
Chloride: 106 mmol/L (ref 98–111)
Creatinine, Ser: 1.08 mg/dL (ref 0.61–1.24)
GFR, Estimated: >60 mL/min (ref >60)
Glucose, Bld: 110 mg/dL — ABNORMAL HIGH (ref 70–99)
Potassium: 4.7 mmol/L (ref 3.5–5.1)
Sodium: 143 mmol/L (ref 135–145)

## 2020-02-07 LAB — CBC
HCT: 43.4 % (ref 39.0–52.0)
Hemoglobin: 14.6 g/dL (ref 13.0–17.0)
MCH: 29.3 pg (ref 26.0–34.0)
MCHC: 33.6 g/dL (ref 30.0–36.0)
MCV: 87.1 fL (ref 80.0–100.0)
Platelets: 338 10*3/uL (ref 150–400)
RBC: 4.98 MIL/uL (ref 4.22–5.81)
RDW: 17.7 % — ABNORMAL HIGH (ref 11.5–15.5)
WBC: 8.7 10*3/uL (ref 4.0–10.5)
nRBC: 0 % (ref 0.0–0.2)

## 2020-02-07 LAB — TROPONIN I (HIGH SENSITIVITY): Troponin I (High Sensitivity): 4 ng/L (ref <18)

## 2020-02-07 MED ORDER — SODIUM CHLORIDE 0.9 % IV BOLUS
1000.0000 mL | Freq: Once | INTRAVENOUS | Status: AC
  Administered 2020-02-08: 1000 mL via INTRAVENOUS

## 2020-02-07 NOTE — ED Triage Notes (Signed)
Pt arrived via [facility] from [facility] with reports of near syncope.  Witnessed by staff.   Pt denies any pain at this time, Pt is alert to self at this time.

## 2020-02-07 NOTE — ED Notes (Signed)
Lab called to collect blood multiple attempts made all unsuccessful.

## 2020-02-07 NOTE — ED Notes (Signed)
Pt is refusing repeat labs and vs

## 2020-02-07 NOTE — ED Provider Notes (Signed)
[facility] Emergency Department Provider Note   ____________________________________________   None    (approximate)  I have reviewed the triage vital signs and the nursing notes.   HISTORY  Chief Complaint Near Syncope    HPI Leonard Forbes is a 64 y.o. male brought to the ED via EMS from [facility] with a chief complaint of near syncope which was witnessed by the staff.  Patient denies complaints.  Denies recent fever, cough, chest pain, shortness of breath, abdominal pain, nausea, vomiting, diarrhea, headache or dizziness.  Denies EtOH or drug use.     Past Medical History:  Diagnosis Date  . Alcohol abuse   . Cocaine abuse ([place])   . Headache   . Headache disorder 06/22/2014  . 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 Hypertension   . Stroke [facility])     Patient Active Problem List   Diagnosis Date Noted  . Malnutrition of moderate degree 04/02/2016  . Alcohol withdrawal ([place]) 03/26/2016  . Cocaine use 03/26/2016  . Lactic acidosis 03/26/2016  . Hyperkalemia 03/26/2016  . Lesion of hard palate 03/26/2016  . Altered mental status 03/25/2016  . HTN (hypertension) 07/07/2014  . Localized enlarged lymph nodes 07/07/2014  . Headache disorder 06/22/2014  . 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 CAROTID ENDARTERECTOMY    . CRANIOTOMY     with clipping  . orif mandible Left   . VENTRICULOPERITONEAL SHUNT      Prior to Admission medications   Medication Sig Start Date End Date Taking? Authorizing Provider  clonazePAM (KLONOPIN) 0.5 MG tablet Take 1 tablet (0.5 mg total) by mouth at bedtime. Patient not taking: Reported on 11/10/2019 04/08/16   Jonetta Osgood, MD  clonazePAM (KLONOPIN) 0.5 MG tablet Take 1 tablet (0.5 mg total) by mouth 3 (three) times daily as needed (anxiety/agitation/sedation). Patient not taking: Reported on 11/10/2019 04/08/16    Jonetta Osgood, MD  feeding supplement, ENSURE ENLIVE, (ENSURE ENLIVE) LIQD Take 237 mLs by mouth 2 (two) times daily between meals. Patient not taking: Reported on 11/10/2019 04/08/16   Jonetta Osgood, MD  metoprolol (LOPRESSOR) 50 MG tablet Take 1 tablet (50 mg total) by mouth 2 (two) times daily. Patient not taking: Reported on 11/10/2019 04/08/16   Jonetta Osgood, MD  risperiDONE (RISPERDAL) 0.5 MG tablet Take 1 tablet (0.5 mg total) by mouth at bedtime. Patient not taking: Reported on 11/10/2019 04/08/16   Jonetta Osgood, MD    Allergies Aspirin  Family History  Problem Relation Age of Onset  . Multiple sclerosis Mother   . Dementia Father   . Stroke Sister     Social History Social History   Tobacco Use  . Smoking status: Current Every Day Smoker  . Smokeless tobacco: Never Used  . Tobacco comment: UTA  Substance Use Topics  . Alcohol use: Yes    Alcohol/week: 0.0 standard drinks    Comment: drinks 3-4 oz of whiskey 6x a week and 3 12oz beers a week  . Drug use: No    Review of Systems  Constitutional: No fever/chills Eyes: No visual changes. ENT: No sore throat. Cardiovascular: Denies chest pain. Respiratory: Denies shortness of breath. Gastrointestinal: No abdominal pain.  No nausea, no vomiting.  No diarrhea.  No constipation. Genitourinary: Negative for dysuria. Musculoskeletal: Negative for back pain. Skin: Negative for rash. Neurological: Positive for near syncope.  Negative for headaches,  focal weakness or numbness.   ____________________________________________   PHYSICAL EXAM:  VITAL SIGNS: ED Triage Vitals  Enc Vitals Group     BP 02/07/20 1919 109/75     Pulse Rate 02/07/20 1919 73     Resp 02/07/20 1919 18     Temp 02/07/20 1919 97.6 F (36.4 C)     Temp Source 02/07/20 1919 Oral     SpO2 02/07/20 1915 99 %     Weight 02/07/20 1919 120 lb (54.4 kg)     Height 02/07/20 1919 _0  (1.753 m)     Head Circumference --      Peak Flow  --      Pain Score --      Pain Loc --      Pain Edu? --      Excl. in [place]? --     Constitutional: Alert and oriented. Well appearing and in no acute distress. Eyes: Conjunctivae are normal. PERRL. EOMI. Head: Atraumatic. Nose: No congestion/rhinnorhea. Mouth/Throat: Mucous membranes are mildly dry.   Neck: No stridor.   Cardiovascular: Normal rate, regular rhythm. Grossly normal heart sounds.  Good peripheral circulation. Respiratory: Normal respiratory effort.  No retractions. Lungs CTAB. Gastrointestinal: Soft and nontender. No distention. No abdominal bruits. No CVA tenderness. Musculoskeletal: No lower extremity tenderness nor edema.  No joint effusions. Neurologic: Alert and oriented to place and self.  Normal speech and language. No gross focal neurologic deficits are appreciated. MAEx4. Skin:  Skin is warm, dry and intact. No rash noted. Psychiatric: Mood and affect are flat. Speech and behavior are normal.  ____________________________________________   LABS (all labs ordered are listed, but only abnormal results are displayed)  Labs Reviewed  BASIC METABOLIC PANEL - Abnormal; Notable for the following components:      Result Value   Glucose, Bld 110 (*)    All other components within normal limits  CBC - Abnormal; Notable for the following components:   RDW 17.7 (*)    All other components within normal limits  URINALYSIS, COMPLETE (UACMP) WITH MICROSCOPIC - Abnormal; Notable for the following components:   Color, Urine YELLOW (*)    APPearance CLEAR (*)    All other components within normal limits  HEPATIC FUNCTION PANEL - Abnormal; Notable for the following components:   Bilirubin, Direct 0.3 (*)    All other components within normal limits  LACTIC ACID, PLASMA - Abnormal; Notable for the following components:   Lactic Acid, Venous 2.9 (*)    All other components within normal limits  CULTURE, BLOOD (ROUTINE X 2)  CULTURE, BLOOD (ROUTINE X 2)  LIPASE, BLOOD   ETHANOL  URINE DRUG SCREEN, QUALITATIVE ([facility] ONLY)  AMMONIA  PROCALCITONIN  LACTIC ACID, PLASMA  TROPONIN I (HIGH SENSITIVITY)  TROPONIN I (HIGH SENSITIVITY)   ____________________________________________  EKG  ED ECG REPORT I, Al Bracewell J, the attending physician, personally viewed and interpreted this ECG.   Date: 02/08/2020  EKG Time: 1916  Rate: 71  Rhythm: normal EKG, normal sinus rhythm  Axis: Normal  Intervals:none  ST&T Change: Nonspecific  ____________________________________________  RADIOLOGY I, Mehreen Azizi J, personally viewed and evaluated these images (plain radiographs) as part of my medical decision making, as well as reviewing the written report by the radiologist.  ED MD interpretation: No acute cardiopulmonary process, noted right VP shunt; CT head with stable small chronic SDH, no acute intracranial abnormality  Official radiology report(s): CT Head Wo Contrast  Result Date: 02/08/2020 CLINICAL DATA:  Mental status changes EXAM:  CT HEAD WITHOUT CONTRAST TECHNIQUE: Contiguous axial images were obtained from the base of the skull through the vertex without intravenous contrast. COMPARISON:  11/10/2019 FINDINGS: Brain: Stable chronic left subdural hematoma, 4 mm. Ventriculoperitoneal shunt catheter remains in place, unchanged. No hydrocephalus. There is atrophy and chronic small vessel disease changes. Vascular: Vascular clips noted on the right, unchanged. Skull: Prior right temporal frontal craniotomy. No acute calvarial abnormality. 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 Stable VP shunt and ventricular size. Atrophy, chronic microvascular disease. No acute intracranial abnormality. Electronically Signed   By: Rolm Baptise M.D.   On: 02/08/2020 00:26   DG Chest Port 1 View  Result Date: 02/07/2020 CLINICAL DATA:  Near-syncope EXAM: PORTABLE CHEST 1 VIEW COMPARISON:  Chest x-ray  11/10/2019, chest x-ray 04/04/2016, CT chest 03/26/2016 FINDINGS: The heart size and mediastinal contours are unchanged. Tortuous and calcified thoracic aorta. Emphysematous changes. No focal consolidation. No pulmonary edema. No pleural effusion. No pneumothorax. No acute osseous abnormality. Right ventriculoperitoneal shunt. IMPRESSION: No active disease. Electronically Signed   By: Iven Finn M.D.   On: 02/07/2020 23:33    ____________________________________________   PROCEDURES  Procedure(s) performed (including Critical Care):  .1-3 Lead EKG Interpretation Performed by: Paulette Blanch, MD Authorized by: Paulette Blanch, MD     Interpretation: normal     ECG rate:  70   ECG rate assessment: normal     Rhythm: sinus rhythm     Ectopy: none     Conduction: normal   Comments:     Patient placed on cardiac monitor to evaluate for arrhythmias     ____________________________________________   INITIAL IMPRESSION / ASSESSMENT AND PLAN / ED COURSE  As part of my medical decision making, I reviewed the following data within the [facility] notes reviewed and incorporated, Labs reviewed, EKG interpreted, Old chart reviewed (cardiology, neurology office visits), Radiograph reviewed and Notes from prior ED visits (multiple ED visits for altered mentation)     64 year old male presenting from SNF with chief complaint of near syncope, now resolved.  Flat affect and difficult historian likely secondary to history of drug use and craniotomy.  Differential diagnosis includes, but is not limited to, alcohol, illicit or prescription medications, or other toxic ingestion; intracranial pathology such as stroke or intracerebral hemorrhage; fever or infectious causes including sepsis; hypoxemia and/or hypercarbia; uremia; trauma; endocrine related disorders such as diabetes, hypoglycemia, and thyroid-related diseases; hypertensive encephalopathy; etc.  Initial CBC, met B,  troponin unremarkable.  Will check LFTs/lipase, repeat troponin, EtOH, lactic acid, ammonia.  Add CT head and chest x-ray.  Will reassess.   Clinical Course as of Feb 08 655  Wed Feb 08, 2020  0120 Patient sleeping in no acute distress.  Work-up largely unremarkable.  Awaiting urinalysis.   [JS]  [id number] Reportedly patient urinated but the sample was not collected.  Elevated lactate noted without other source.  Will administer second liter IV fluids, collect UA and repeat lactate.   [JS]  [id number] UA unremarkable.  Second liter IV fluids infusing.  Will repeat lactic acid after completion of second liter IV fluids.  Patient remains hemodynamically stable and resting in no acute distress.   [JS]  [id number] Preliminary blood culture results demonstrate no growth. Patient remains hemodynamically stable. Repeat lactic acid is pending. Anticipate discharge back to SNF as long as repeat lactic acid is stable or trending down. Care is transferred at change of  shift to Dr. Charna Archer.   [JS]    Clinical Course User Index [JS] Paulette Blanch, MD     ____________________________________________   FINAL CLINICAL IMPRESSION(S) / ED DIAGNOSES  Final diagnoses:  Near syncope  Confusion     ED Discharge Orders    None      *Please note:  Jordani Nunn was evaluated in Emergency Department on 02/08/2020 for the symptoms described in the history of present illness. He was evaluated in the context of the global COVID-19 pandemic, which necessitated consideration that the patient might be at risk for infection with the SARS-CoV-2 virus that causes COVID-19. Institutional protocols and algorithms that pertain to the evaluation of patients at risk for COVID-19 are in a state of rapid change based on information released by regulatory bodies including the CDC and federal and state organizations. These policies and algorithms were followed during the patient's care in the ED.  Some ED evaluations and interventions may be  delayed as a result of limited staffing during and the pandemic.*   Note:  This document was prepared using Dragon voice recognition software and may include unintentional dictation errors.   Paulette Blanch, MD 02/08/20 2312

## 2020-02-07 NOTE — ED Notes (Signed)
Resumed care from reed rn.  Pt alert.  Sitter with pt.  Bed alarm on pt.  md at bedside.

## 2020-02-08 LAB — URINE DRUG SCREEN, QUALITATIVE (ARMC ONLY)
Amphetamines, Ur Screen: NOT DETECTED
Barbiturates, Ur Screen: NOT DETECTED
Benzodiazepine, Ur Scrn: NOT DETECTED
Cannabinoid 50 Ng, Ur ~~LOC~~: NOT DETECTED
Cocaine Metabolite,Ur ~~LOC~~: NOT DETECTED
MDMA (Ecstasy)Ur Screen: NOT DETECTED
Methadone Scn, Ur: NOT DETECTED
Opiate, Ur Screen: NOT DETECTED
Phencyclidine (PCP) Ur S: NOT DETECTED
Tricyclic, Ur Screen: NOT DETECTED

## 2020-02-08 LAB — HEPATIC FUNCTION PANEL
ALT: 16 U/L (ref 0–44)
AST: 22 U/L (ref 15–41)
Albumin: 3.6 g/dL (ref 3.5–5.0)
Alkaline Phosphatase: 66 U/L (ref 38–126)
Bilirubin, Direct: 0.3 mg/dL — ABNORMAL HIGH (ref 0.0–0.2)
Indirect Bilirubin: 0.4 mg/dL (ref 0.3–0.9)
Total Bilirubin: 0.7 mg/dL (ref 0.3–1.2)
Total Protein: 6.9 g/dL (ref 6.5–8.1)

## 2020-02-08 LAB — URINALYSIS, COMPLETE (UACMP) WITH MICROSCOPIC
Bacteria, UA: NONE SEEN
Bilirubin Urine: NEGATIVE
Glucose, UA: NEGATIVE mg/dL
Hgb urine dipstick: NEGATIVE
Ketones, ur: NEGATIVE mg/dL
Leukocytes,Ua: NEGATIVE
Nitrite: NEGATIVE
Protein, ur: NEGATIVE mg/dL
Specific Gravity, Urine: 1.019 (ref 1.005–1.030)
Squamous Epithelial / HPF: NONE SEEN (ref 0–5)
pH: 6 (ref 5.0–8.0)

## 2020-02-08 LAB — LACTIC ACID, PLASMA
Lactic Acid, Venous: 2.9 mmol/L (ref 0.5–1.9)
Lactic Acid, Venous: 1.3 mmol/L (ref 0.5–1.9)

## 2020-02-08 LAB — TROPONIN I (HIGH SENSITIVITY): Troponin I (High Sensitivity): 5 ng/L (ref <18)

## 2020-02-08 LAB — AMMONIA: Ammonia: 24 umol/L (ref 9–35)

## 2020-02-08 LAB — PROCALCITONIN: Procalcitonin: <0.1 ng/mL

## 2020-02-08 LAB — LIPASE, BLOOD: Lipase: 25 U/L (ref 11–51)

## 2020-02-08 LAB — ETHANOL: Alcohol, Ethyl (B): <10 mg/dL (ref <10)

## 2020-02-08 MED ORDER — SODIUM CHLORIDE 0.9 % IV BOLUS
1000.0000 mL | Freq: Once | INTRAVENOUS | Status: AC
  Administered 2020-02-08: 1000 mL via INTRAVENOUS

## 2020-02-08 NOTE — ED Notes (Signed)
Pt resting comfortably at this time. Waiting for EMS arrival at this time. Sitter at bedside.

## 2020-02-08 NOTE — ED Provider Notes (Signed)
-----------------------------------------   7:04 AM on 02/08/2020 -----------------------------------------  Blood pressure (!) 138/109, pulse 62, temperature 97.6 F (36.4 C), temperature source Oral, resp. rate 18, height 5\' 9"  (1.753 m), weight 54.4 kg, SpO2 100 %.  Assuming care from Dr. .  In short, Leonard Forbes is a 64 y.o. male with a chief complaint of Near Syncope .  Refer to the original H&P for additional details.  The current plan of care is to follow-up repeat lactic acid.  ----------------------------------------- 8:16 AM on 02/08/2020 -----------------------------------------  Repeat lactic acid within normal limits.  On reassessment, patient is awake and alert with no complaints at this time.  He is appropriate for discharge home to [facility] healthcare per Dr. [facility].  Patient agrees with plan.    Dolores Frame, MD 02/08/20 [phone]

## 2020-02-08 NOTE — Discharge Instructions (Addendum)
Your blood work, urinalysis, CT scan and x-ray are reassuring. Return to the ER for worsening symptoms, persistent vomiting, difficulty breathing, lethargy or other concerns.

## 2020-02-08 NOTE — ED Notes (Signed)
Pt up to the bathroom, no assistance needed.

## 2020-02-08 NOTE — ED Notes (Addendum)
Report to [facility] at [facility]

## 2020-02-08 NOTE — ED Notes (Signed)
Pt up to the bathroom with no distress noted, no assistance needed at this time.

## 2020-02-08 NOTE — ED Notes (Signed)
Pt resting quietly at this time. NO distress noted. Pt requests to urinate at bedside toilet, pt assisted with minimal assist. Bed alarm in place as well and sitter at bedside. Pt does keep trying to out of bed on own. Pt educated on importance of using call bell.

## 2020-02-08 NOTE — ED Notes (Signed)
Pt up to bathroom, no assistance needed

## 2020-02-08 NOTE — ED Notes (Signed)
Iv fluids infusing.  

## 2020-02-08 NOTE — ED Notes (Signed)
EMS to transport pt back to [facility] health center. All questions answered for EMS.

## 2020-02-08 NOTE — ED Notes (Signed)
This tech assigned to be Recruitment consultant

## 2020-02-09 NOTE — ED Provider Notes (Signed)
-----------------------------------------   12:29 AM on 02/09/2020 -----------------------------------------  Notified by pharmacy that 1 out of 4 blood culture bottles grew out gram-positive rods. Charge nursing Raquel to notify SNF to send patient to the ED.   Irean Hong, MD 02/09/20 0030

## 2020-02-09 NOTE — ED Notes (Signed)
Lab called with positive blood culture results. Dr. Dolores Frame made aware and chart was reviewed. This nurse called [facility] where patient resides and staff states patient is doing well with normal VS. Encouraged staff to return to ED for any worsening symptoms or concerns and to have MD or NP from facility follow up with patient in am and possibly repeat blood work if they feel is needed.

## 2020-02-12 LAB — CULTURE, BLOOD (ROUTINE X 2)
Special Requests: ADEQUATE
Special Requests: ADEQUATE

## 2020-02-23 ENCOUNTER — Emergency Department: Payer: Medicare Other

## 2020-02-23 ENCOUNTER — Other Ambulatory Visit: Payer: Self-pay

## 2020-02-23 ENCOUNTER — Encounter (HOSPITAL_COMMUNITY): Payer: Self-pay | Admitting: Internal Medicine

## 2020-02-23 ENCOUNTER — Inpatient Hospital Stay (HOSPITAL_COMMUNITY)
Admission: EM | Admit: 2020-02-23 | Discharge: 2020-06-05 | DRG: 981 | Disposition: E | Payer: Medicare Other | Attending: Internal Medicine | Admitting: Internal Medicine

## 2020-02-23 ENCOUNTER — Emergency Department
Admission: EM | Admit: 2020-02-23 | Discharge: 2020-02-23 | Disposition: A | Discharge status: Home / Self Care | Payer: Medicare Other | Attending: Emergency Medicine | Admitting: Emergency Medicine

## 2020-02-23 DIAGNOSIS — S061X9A Traumatic cerebral edema with loss of consciousness of unspecified duration, initial encounter: Secondary | ICD-10-CM | POA: Diagnosis present

## 2020-02-23 DIAGNOSIS — K921 Melena: Secondary | ICD-10-CM | POA: Diagnosis not present

## 2020-02-23 DIAGNOSIS — R64 Cachexia: Secondary | ICD-10-CM | POA: Diagnosis present

## 2020-02-23 DIAGNOSIS — D509 Iron deficiency anemia, unspecified: Secondary | ICD-10-CM | POA: Diagnosis present

## 2020-02-23 DIAGNOSIS — Z189 Retained foreign body fragments, unspecified material: Secondary | ICD-10-CM

## 2020-02-23 DIAGNOSIS — K449 Diaphragmatic hernia without obstruction or gangrene: Secondary | ICD-10-CM | POA: Diagnosis present

## 2020-02-23 DIAGNOSIS — N179 Acute kidney failure, unspecified: Secondary | ICD-10-CM | POA: Diagnosis not present

## 2020-02-23 DIAGNOSIS — Z87820 Personal history of traumatic brain injury: Secondary | ICD-10-CM | POA: Diagnosis not present

## 2020-02-23 DIAGNOSIS — Z5900 Homelessness unspecified: Secondary | ICD-10-CM | POA: Diagnosis not present

## 2020-02-23 DIAGNOSIS — F1010 Alcohol abuse, uncomplicated: Secondary | ICD-10-CM

## 2020-02-23 DIAGNOSIS — W1830XA Fall on same level, unspecified, initial encounter: Secondary | ICD-10-CM | POA: Diagnosis not present

## 2020-02-23 DIAGNOSIS — K319 Disease of stomach and duodenum, unspecified: Secondary | ICD-10-CM | POA: Diagnosis present

## 2020-02-23 DIAGNOSIS — Z7189 Other specified counseling: Secondary | ICD-10-CM | POA: Diagnosis not present

## 2020-02-23 DIAGNOSIS — F202 Catatonic schizophrenia: Secondary | ICD-10-CM | POA: Diagnosis not present

## 2020-02-23 DIAGNOSIS — G9608 Other cranial cerebrospinal fluid leak: Secondary | ICD-10-CM | POA: Diagnosis present

## 2020-02-23 DIAGNOSIS — R000 Tachycardia, unspecified: Secondary | ICD-10-CM | POA: Diagnosis not present

## 2020-02-23 DIAGNOSIS — Y92810 Car as the place of occurrence of the external cause: Secondary | ICD-10-CM | POA: Insufficient documentation

## 2020-02-23 DIAGNOSIS — R4189 Other symptoms and signs involving cognitive functions and awareness: Secondary | ICD-10-CM | POA: Diagnosis present

## 2020-02-23 DIAGNOSIS — I10 Essential (primary) hypertension: Secondary | ICD-10-CM | POA: Diagnosis not present

## 2020-02-23 DIAGNOSIS — Y92230 Patient room in hospital as the place of occurrence of the external cause: Secondary | ICD-10-CM | POA: Diagnosis not present

## 2020-02-23 DIAGNOSIS — F1020 Alcohol dependence, uncomplicated: Secondary | ICD-10-CM | POA: Diagnosis not present

## 2020-02-23 DIAGNOSIS — S0001XA Abrasion of scalp, initial encounter: Secondary | ICD-10-CM | POA: Diagnosis not present

## 2020-02-23 DIAGNOSIS — E878 Other disorders of electrolyte and fluid balance, not elsewhere classified: Secondary | ICD-10-CM | POA: Diagnosis not present

## 2020-02-23 DIAGNOSIS — S02672A Fracture of alveolus of left mandible, initial encounter for closed fracture: Secondary | ICD-10-CM | POA: Diagnosis present

## 2020-02-23 DIAGNOSIS — K029 Dental caries, unspecified: Secondary | ICD-10-CM | POA: Diagnosis present

## 2020-02-23 DIAGNOSIS — E870 Hyperosmolality and hypernatremia: Secondary | ICD-10-CM | POA: Diagnosis not present

## 2020-02-23 DIAGNOSIS — R627 Adult failure to thrive: Secondary | ICD-10-CM | POA: Diagnosis not present

## 2020-02-23 DIAGNOSIS — K047 Periapical abscess without sinus: Secondary | ICD-10-CM | POA: Diagnosis present

## 2020-02-23 DIAGNOSIS — Z982 Presence of cerebrospinal fluid drainage device: Secondary | ICD-10-CM | POA: Diagnosis not present

## 2020-02-23 DIAGNOSIS — E440 Moderate protein-calorie malnutrition: Secondary | ICD-10-CM | POA: Diagnosis not present

## 2020-02-23 DIAGNOSIS — Z66 Do not resuscitate: Secondary | ICD-10-CM | POA: Diagnosis not present

## 2020-02-23 DIAGNOSIS — Z515 Encounter for palliative care: Secondary | ICD-10-CM

## 2020-02-23 DIAGNOSIS — Y95 Nosocomial condition: Secondary | ICD-10-CM | POA: Diagnosis not present

## 2020-02-23 DIAGNOSIS — R509 Fever, unspecified: Secondary | ICD-10-CM | POA: Diagnosis not present

## 2020-02-23 DIAGNOSIS — G936 Cerebral edema: Secondary | ICD-10-CM | POA: Diagnosis not present

## 2020-02-23 DIAGNOSIS — Z7409 Other reduced mobility: Secondary | ICD-10-CM | POA: Diagnosis not present

## 2020-02-23 DIAGNOSIS — G9340 Encephalopathy, unspecified: Secondary | ICD-10-CM | POA: Diagnosis not present

## 2020-02-23 DIAGNOSIS — Z9889 Other specified postprocedural states: Secondary | ICD-10-CM | POA: Diagnosis not present

## 2020-02-23 DIAGNOSIS — K264 Chronic or unspecified duodenal ulcer with hemorrhage: Secondary | ICD-10-CM | POA: Diagnosis not present

## 2020-02-23 DIAGNOSIS — Z5989 Other problems related to housing and economic circumstances: Secondary | ICD-10-CM | POA: Diagnosis not present

## 2020-02-23 DIAGNOSIS — S02609A Fracture of mandible, unspecified, initial encounter for closed fracture: Secondary | ICD-10-CM | POA: Diagnosis present

## 2020-02-23 DIAGNOSIS — F331 Major depressive disorder, recurrent, moderate: Secondary | ICD-10-CM | POA: Diagnosis not present

## 2020-02-23 DIAGNOSIS — S02652A Fracture of angle of left mandible, initial encounter for closed fracture: Secondary | ICD-10-CM | POA: Insufficient documentation

## 2020-02-23 DIAGNOSIS — Z681 Body mass index (BMI) 19 or less, adult: Secondary | ICD-10-CM

## 2020-02-23 DIAGNOSIS — Z789 Other specified health status: Secondary | ICD-10-CM

## 2020-02-23 DIAGNOSIS — Z23 Encounter for immunization: Secondary | ICD-10-CM | POA: Insufficient documentation

## 2020-02-23 DIAGNOSIS — K920 Hematemesis: Secondary | ICD-10-CM | POA: Diagnosis not present

## 2020-02-23 DIAGNOSIS — Z8781 Personal history of (healed) traumatic fracture: Secondary | ICD-10-CM | POA: Diagnosis not present

## 2020-02-23 DIAGNOSIS — S0990XA Unspecified injury of head, initial encounter: Secondary | ICD-10-CM | POA: Diagnosis present

## 2020-02-23 DIAGNOSIS — E43 Unspecified severe protein-calorie malnutrition: Secondary | ICD-10-CM | POA: Diagnosis present

## 2020-02-23 DIAGNOSIS — S065X9A Traumatic subdural hemorrhage with loss of consciousness of unspecified duration, initial encounter: Secondary | ICD-10-CM | POA: Diagnosis not present

## 2020-02-23 DIAGNOSIS — U071 COVID-19: Secondary | ICD-10-CM | POA: Diagnosis not present

## 2020-02-23 DIAGNOSIS — E162 Hypoglycemia, unspecified: Secondary | ICD-10-CM | POA: Diagnosis not present

## 2020-02-23 DIAGNOSIS — Z20822 Contact with and (suspected) exposure to covid-19: Secondary | ICD-10-CM | POA: Diagnosis not present

## 2020-02-23 DIAGNOSIS — R400 Somnolence: Secondary | ICD-10-CM | POA: Diagnosis not present

## 2020-02-23 DIAGNOSIS — E512 Wernicke's encephalopathy: Secondary | ICD-10-CM | POA: Diagnosis not present

## 2020-02-23 DIAGNOSIS — D751 Secondary polycythemia: Secondary | ICD-10-CM | POA: Diagnosis not present

## 2020-02-23 DIAGNOSIS — K279 Peptic ulcer, site unspecified, unspecified as acute or chronic, without hemorrhage or perforation: Secondary | ICD-10-CM | POA: Diagnosis not present

## 2020-02-23 DIAGNOSIS — F322 Major depressive disorder, single episode, severe without psychotic features: Secondary | ICD-10-CM | POA: Diagnosis not present

## 2020-02-23 DIAGNOSIS — E860 Dehydration: Secondary | ICD-10-CM | POA: Diagnosis not present

## 2020-02-23 DIAGNOSIS — F332 Major depressive disorder, recurrent severe without psychotic features: Secondary | ICD-10-CM | POA: Diagnosis not present

## 2020-02-23 DIAGNOSIS — G9349 Other encephalopathy: Secondary | ICD-10-CM | POA: Diagnosis not present

## 2020-02-23 DIAGNOSIS — F329 Major depressive disorder, single episode, unspecified: Secondary | ICD-10-CM | POA: Diagnosis not present

## 2020-02-23 DIAGNOSIS — Z95828 Presence of other vascular implants and grafts: Secondary | ICD-10-CM | POA: Diagnosis not present

## 2020-02-23 HISTORY — DX: Other specified health status: Z78.9

## 2020-02-23 LAB — CBC WITH DIFFERENTIAL/PLATELET
Abs Immature Granulocytes: 0.03 10*3/uL (ref 0.00–0.07)
Basophils Absolute: 0 10*3/uL (ref 0.0–0.1)
Basophils Relative: 0 %
Eosinophils Absolute: 0 10*3/uL (ref 0.0–0.5)
Eosinophils Relative: 0 %
HCT: 42.8 % (ref 39.0–52.0)
Hemoglobin: 12.7 g/dL — ABNORMAL LOW (ref 13.0–17.0)
Immature Granulocytes: 0 %
Lymphocytes Relative: 6 %
Lymphs Abs: 0.7 10*3/uL (ref 0.7–4.0)
MCH: 28.3 pg (ref 26.0–34.0)
MCHC: 29.7 g/dL — ABNORMAL LOW (ref 30.0–36.0)
MCV: 95.3 fL (ref 80.0–100.0)
Monocytes Absolute: 0.9 10*3/uL (ref 0.1–1.0)
Monocytes Relative: 8 %
Neutro Abs: 9.6 10*3/uL — ABNORMAL HIGH (ref 1.7–7.7)
Neutrophils Relative %: 86 %
Platelets: 206 10*3/uL (ref 150–400)
RBC: 4.49 MIL/uL (ref 4.22–5.81)
RDW: 18.2 % — ABNORMAL HIGH (ref 11.5–15.5)
WBC: 11.3 10*3/uL — ABNORMAL HIGH (ref 4.0–10.5)
nRBC: 0 % (ref 0.0–0.2)

## 2020-02-23 LAB — RESP PANEL BY RT-PCR (FLU A&B, COVID) ARPGX2
Influenza A by PCR: NEGATIVE
Influenza B by PCR: NEGATIVE
SARS Coronavirus 2 by RT PCR: NEGATIVE

## 2020-02-23 LAB — ETHANOL: Alcohol, Ethyl (B): <10 mg/dL (ref <10)

## 2020-02-23 MED ORDER — ACETAMINOPHEN 325 MG PO TABS: 650.0000 mg | ORAL_TABLET | Freq: Four times a day (QID) | ORAL | Status: DC | PRN

## 2020-02-23 MED ORDER — ENOXAPARIN SODIUM 40 MG/0.4ML ~~LOC~~ SOLN
40.0000 mg | SUBCUTANEOUS | Status: DC
  Administered 2020-02-23: 40 mg via SUBCUTANEOUS
  Filled 2020-02-23: qty 0.4

## 2020-02-23 MED ORDER — ONDANSETRON HCL 4 MG PO TABS: 4.0000 mg | ORAL_TABLET | Freq: Four times a day (QID) | ORAL | Status: DC | PRN

## 2020-02-23 MED ORDER — DEXAMETHASONE SODIUM PHOSPHATE 10 MG/ML IJ SOLN: 10.0000 mg | INTRAMUSCULAR | Status: DC

## 2020-02-23 MED ORDER — HYDROMORPHONE HCL 1 MG/ML IJ SOLN
0.5000 mg | INTRAMUSCULAR | Status: DC
  Administered 2020-02-23 – 2020-02-24 (×3): 0.5 mg via INTRAVENOUS
  Administered 2020-02-24: 1 mg via INTRAVENOUS
  Administered 2020-02-24 – 2020-02-25 (×3): 0.5 mg via INTRAVENOUS
  Filled 2020-02-23 (×3): qty 0.5
  Filled 2020-02-23: qty 1
  Filled 2020-02-23 (×3): qty 0.5

## 2020-02-23 MED ORDER — ADULT MULTIVITAMIN W/MINERALS CH
1.0000 | ORAL_TABLET | Freq: Every day | ORAL | Status: DC
  Filled 2020-02-23: qty 1

## 2020-02-23 MED ORDER — DEXAMETHASONE SODIUM PHOSPHATE 10 MG/ML IJ SOLN
10.0000 mg | Freq: Once | INTRAMUSCULAR | Status: AC
  Administered 2020-02-23: 10 mg via INTRAVENOUS
  Filled 2020-02-23: qty 1

## 2020-02-23 MED ORDER — HEPARIN SODIUM (PORCINE) 5000 UNIT/ML IJ SOLN: 5000.0000 [IU] | Freq: Three times a day (TID) | INTRAMUSCULAR | Status: DC

## 2020-02-23 MED ORDER — FOLIC ACID 1 MG PO TABS
1.0000 mg | ORAL_TABLET | Freq: Every day | ORAL | Status: DC
  Filled 2020-02-23: qty 1

## 2020-02-23 MED ORDER — THIAMINE HCL 100 MG PO TABS
100.0000 mg | ORAL_TABLET | Freq: Every day | ORAL | Status: DC
  Filled 2020-02-23: qty 1

## 2020-02-23 MED ORDER — ONDANSETRON HCL 4 MG/2ML IJ SOLN
4.0000 mg | Freq: Four times a day (QID) | INTRAMUSCULAR | Status: DC | PRN
  Administered 2020-03-04: 4 mg via INTRAVENOUS
  Filled 2020-02-23: qty 2

## 2020-02-23 MED ORDER — TETANUS-DIPHTH-ACELL PERTUSSIS 5-2.5-18.5 LF-MCG/0.5 IM SUSY
0.5000 mL | PREFILLED_SYRINGE | Freq: Once | INTRAMUSCULAR | Status: AC
  Administered 2020-02-23: 0.5 mL via INTRAMUSCULAR
  Filled 2020-02-23: qty 0.5

## 2020-02-23 MED ORDER — SODIUM CHLORIDE 0.9 % IV SOLN
3.0000 g | Freq: Four times a day (QID) | INTRAVENOUS | Status: DC
  Administered 2020-02-23 – 2020-03-04 (×35): 3 g via INTRAVENOUS
  Filled 2020-02-23: qty 8
  Filled 2020-02-23: qty 3
  Filled 2020-02-23 (×5): qty 8
  Filled 2020-02-23: qty 3
  Filled 2020-02-23: qty 8
  Filled 2020-02-23: qty 3
  Filled 2020-02-23 (×3): qty 8
  Filled 2020-02-23 (×2): qty 3
  Filled 2020-02-23 (×5): qty 8
  Filled 2020-02-23 (×3): qty 3
  Filled 2020-02-23 (×2): qty 8
  Filled 2020-02-23: qty 3
  Filled 2020-02-23 (×2): qty 8
  Filled 2020-02-23: qty 3
  Filled 2020-02-23 (×2): qty 8
  Filled 2020-02-23: qty 3
  Filled 2020-02-23: qty 8
  Filled 2020-02-23: qty 3
  Filled 2020-02-23 (×6): qty 8
  Filled 2020-02-23: qty 3
  Filled 2020-02-23 (×3): qty 8
  Filled 2020-02-23: qty 3
  Filled 2020-02-23: qty 8
  Filled 2020-02-23: qty 3

## 2020-02-23 MED ORDER — SODIUM CHLORIDE 0.9 % IV BOLUS
1000.0000 mL | Freq: Once | INTRAVENOUS | Status: AC
  Administered 2020-02-23: 1000 mL via INTRAVENOUS

## 2020-02-23 MED ORDER — KETOROLAC TROMETHAMINE 30 MG/ML IJ SOLN
30.0000 mg | Freq: Once | INTRAMUSCULAR | Status: AC
  Administered 2020-02-23: 30 mg via INTRAVENOUS
  Filled 2020-02-23: qty 1

## 2020-02-23 MED ORDER — SODIUM CHLORIDE 0.9 % IV SOLN
INTRAVENOUS | Status: DC | PRN
  Administered 2020-02-23: 250 mL via INTRAVENOUS
  Administered 2020-02-27: 500 mL via INTRAVENOUS

## 2020-02-23 MED ORDER — SENNOSIDES-DOCUSATE SODIUM 8.6-50 MG PO TABS: 1.0000 | ORAL_TABLET | Freq: Every evening | ORAL | Status: DC | PRN

## 2020-02-23 MED ORDER — MORPHINE SULFATE (PF) 4 MG/ML IV SOLN: 4.0000 mg | INTRAVENOUS | Status: DC | PRN

## 2020-02-23 MED ORDER — SODIUM CHLORIDE 0.9% FLUSH
3.0000 mL | Freq: Two times a day (BID) | INTRAVENOUS | Status: DC
  Administered 2020-02-24 – 2020-05-16 (×78): 3 mL via INTRAVENOUS

## 2020-02-23 MED ORDER — ACETAMINOPHEN 650 MG RE SUPP
650.0000 mg | Freq: Four times a day (QID) | RECTAL | Status: DC | PRN
  Filled 2020-02-23: qty 1

## 2020-02-23 MED ORDER — HYDROMORPHONE HCL 1 MG/ML IJ SOLN: 0.5000 mg | INTRAMUSCULAR | Status: DC | PRN

## 2020-02-23 NOTE — ED Notes (Signed)
CARELINK  CALLED  PER  DR  BRADLER  MD 

## 2020-02-23 NOTE — ED Triage Notes (Signed)
Pt arrived via EMS with AMS, unknown pt at this time

## 2020-02-23 NOTE — Plan of Care (Addendum)
Shift assessment completed to the best of my ability. Pt does not speak much. Pt responses to "are you in pain" and that is about all. Unable to give PO meds d/t pt refused, educated patient on meds and still refused. Will continue to monitor patient.  Attempt to reassess patient @ 0600 pt still mostly mute with a grunt yes or no here and there.    Problem: Education: Goal: Knowledge of General Education information will improve Description: Including pain rating scale, medication(s)/side effects and non-pharmacologic comfort measures Outcome: Progressing   Problem: Activity: Goal: Risk for activity intolerance will decrease Outcome: Progressing   Problem: Pain Managment: Goal: General experience of comfort will improve Outcome: Progressing   Problem: Safety: Goal: Ability to remain free from injury will improve Outcome: Progressing   Problem: Skin Integrity: Goal: Risk for impaired skin integrity will decrease Outcome: Progressing

## 2020-02-23 NOTE — ED Notes (Signed)
Pt identified by police by multiple scars. Registration to merge charts.
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
## 2020-02-23 NOTE — ED Notes (Signed)
Report to [facility] 

## 2020-02-23 NOTE — ED Notes (Signed)
[facility] here to transport pt, Pt cannot sign transfer consent due to AMS.
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
## 2020-02-23 NOTE — ED Notes (Signed)
Pt going to CT
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
## 2020-02-23 NOTE — ED Notes (Signed)
Report to Cathlean Cower, Charity fundraiser at [facility]
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
## 2020-02-23 NOTE — ED Notes (Signed)
[place] PD at bedside to help identify pt

## 2020-02-23 NOTE — ED Notes (Signed)
Pt resting comfortably at this time. No distress noted. Pt seem to be resting well and has become less hypertensive after Toradol injection. IVF infusing. This RN offered to help change pt due to smell or urine, pt refuses at this time.

## 2020-02-24 DIAGNOSIS — S02609A Fracture of mandible, unspecified, initial encounter for closed fracture: Secondary | ICD-10-CM | POA: Diagnosis not present

## 2020-02-24 DIAGNOSIS — S065X9A Traumatic subdural hemorrhage with loss of consciousness of unspecified duration, initial encounter: Secondary | ICD-10-CM

## 2020-02-24 DIAGNOSIS — K047 Periapical abscess without sinus: Secondary | ICD-10-CM | POA: Diagnosis not present

## 2020-02-24 DIAGNOSIS — Z7409 Other reduced mobility: Secondary | ICD-10-CM

## 2020-02-24 LAB — RAPID URINE DRUG SCREEN, HOSP PERFORMED
Amphetamines: NOT DETECTED
Barbiturates: NOT DETECTED
Benzodiazepines: NOT DETECTED
Cocaine: NOT DETECTED
Opiates: POSITIVE — AB
Tetrahydrocannabinol: NOT DETECTED

## 2020-02-24 LAB — BASIC METABOLIC PANEL
Anion gap: 12 (ref 5–15)
Anion gap: 9 (ref 5–15)
BUN: 24 mg/dL — ABNORMAL HIGH (ref 8–23)
BUN: 25 mg/dL — ABNORMAL HIGH (ref 8–23)
CO2: 19 mmol/L — ABNORMAL LOW (ref 22–32)
CO2: 25 mmol/L (ref 22–32)
Calcium: 8.4 mg/dL — ABNORMAL LOW (ref 8.9–10.3)
Calcium: 8.4 mg/dL — ABNORMAL LOW (ref 8.9–10.3)
Chloride: 113 mmol/L — ABNORMAL HIGH (ref 98–111)
Chloride: 110 mmol/L (ref 98–111)
Creatinine, Ser: 0.93 mg/dL (ref 0.61–1.24)
Creatinine, Ser: 1.05 mg/dL (ref 0.61–1.24)
GFR, Estimated: >60 mL/min (ref >60)
GFR, Estimated: >60 mL/min (ref >60)
Glucose, Bld: 94 mg/dL (ref 70–99)
Glucose, Bld: 121 mg/dL — ABNORMAL HIGH (ref 70–99)
Potassium: 4.3 mmol/L (ref 3.5–5.1)
Potassium: 4.2 mmol/L (ref 3.5–5.1)
Sodium: 144 mmol/L (ref 135–145)
Sodium: 144 mmol/L (ref 135–145)

## 2020-02-24 LAB — CBC
HCT: 37 % — ABNORMAL LOW (ref 39.0–52.0)
Hemoglobin: 12.2 g/dL — ABNORMAL LOW (ref 13.0–17.0)
MCH: 28.9 pg (ref 26.0–34.0)
MCHC: 33 g/dL (ref 30.0–36.0)
MCV: 87.7 fL (ref 80.0–100.0)
Platelets: 344 10*3/uL (ref 150–400)
RBC: 4.22 MIL/uL (ref 4.22–5.81)
RDW: 17.8 % — ABNORMAL HIGH (ref 11.5–15.5)
WBC: 11 10*3/uL — ABNORMAL HIGH (ref 4.0–10.5)
nRBC: 0 % (ref 0.0–0.2)

## 2020-02-24 LAB — HIV ANTIBODY (ROUTINE TESTING W REFLEX): HIV Screen 4th Generation wRfx: NONREACTIVE

## 2020-02-24 MED ORDER — LACTATED RINGERS IV SOLN: INTRAVENOUS | Status: AC

## 2020-02-24 MED ORDER — DEXAMETHASONE SODIUM PHOSPHATE 10 MG/ML IJ SOLN
8.0000 mg | Freq: Three times a day (TID) | INTRAMUSCULAR | Status: DC
  Administered 2020-02-24 – 2020-02-26 (×8): 8 mg via INTRAVENOUS
  Filled 2020-02-24 (×8): qty 1
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
## 2020-02-24 NOTE — Progress Notes (Signed)
Pt unable to communicate, eat or drink today. Didn't know where he was, nodding to "yes" or "no" questions. Eventually, pt was oriented enough to write his name and date of birth, confirming his identity.

## 2020-02-24 NOTE — TOC Initial Note (Addendum)
Transition of Care [facility]) - Initial/Assessment Note    Patient Details  Name: Leonard Forbes MRN: [id number] Date of Birth: 13-Nov-1955  Transition of Care [facility]) CM/SW Contact:    Janae Bridgeman, RN Phone Number: 02/24/2020, 2:12 PM  Clinical Narrative:                 Case management spoke with Dr. Charissa Bash on the phone and the patient does not have any listed emergency contacts in the chart and the physician is in need of speaking with next of kin regarding permission for surgery scheduled for tomorrow.  Previous encounters for hospitalization listed the following family contacts until the current chart is able to be merged by admitting:  Jani Files, friend - [phone], [phone] Cherly Hensen. - brother - [phone], [phone] Cheyenne Adas, son - [phone]  I contacted admitting and asked that they merge the patient's chart so that previous visits, history, and emergency contacts are available and they are in the process of merging the chart at this time.  I will continue to follow the patient for patient needs and support.   Expected Discharge Plan:  (previous history notes patient is homeless) Barriers to Discharge: Homeless with medical needs   Patient Goals and CMS Choice Patient states their goals for this hospitalization and ongoing recovery are:: Patient currently with altered mental status      Expected Discharge Plan and Services Expected Discharge Plan:  (previous history notes patient is homeless)   Discharge Planning Services: CM Consult   Living arrangements for the past 2 months: Homeless                                      Prior Living Arrangements/Services Living arrangements for the past 2 months: Homeless                     Activities of Daily Living      Permission Sought/Granted                  Emotional Assessment              Admission diagnosis:  Mandibular fracture  (HCC) [S02.609A] Closed fracture of left side of mandible, unspecified mandibular site, initial encounter [facility]) [S02.609A] Patient Active Problem List   Diagnosis Date Noted  . Mandibular fracture (HCC) 02/08/2020   PCP:  Pcp, No Pharmacy:  No Pharmacies Listed    Social Determinants of Health (SDOH) Interventions    Readmission Risk Interventions No flowsheet data found.

## 2020-02-24 NOTE — TOC CAGE-AID Note (Signed)
Transition of Care [facility]) - CAGE-AID Screening   Patient Details  Name: Leonard Forbes MRN: [id number] Date of Birth: 10/09/1962  Transition of Care [facility]) CM/SW Contact:    Jimmy Picket, LCSWA Phone Number: 02/24/2020, 10:48 AM   Clinical Narrative:  Pt was unable to participate in assessment due to only being oriented to person.   CAGE-AID Screening: Substance Abuse Screening unable to be completed due to: : Patient unable to participate            Isabella Stalling Clinical Social Worker [phone]

## 2020-02-24 NOTE — Progress Notes (Addendum)
STATUS UPDATE:  Concerns regarding patient being unable to consent to ORIF of right mandibular fracture given his current mental status. He is unable to follow commands appropriately and does not possess capacity to consent at this time.  Attempted to call patient's next of kin, [name] (brother) x3 and [name] (son) x3 for consent for patient's ORIF of mandibular fracture scheduled for Saturday 02/11/2020 at 9:30AM. At this time, unable to reach either party. Given that patient is unable to eat at this time in order to sustain himself, and given that in the usual state, a usual person with appropriate capacity would consent to this procedure, will go ahead and proceed with scheduled procedure for tomorrow.

## 2020-02-25 ENCOUNTER — Inpatient Hospital Stay (HOSPITAL_COMMUNITY): Payer: Medicare Other | Admitting: Certified Registered"

## 2020-02-25 ENCOUNTER — Encounter (HOSPITAL_COMMUNITY): Admission: EM | Disposition: E | Payer: Self-pay | Source: Home / Self Care | Attending: Internal Medicine

## 2020-02-25 ENCOUNTER — Inpatient Hospital Stay (HOSPITAL_COMMUNITY): Payer: Medicare Other

## 2020-02-25 HISTORY — PX: ORIF MANDIBULAR FRACTURE: SHX2127

## 2020-02-25 LAB — CBC
HCT: 36.1 % — ABNORMAL LOW (ref 39.0–52.0)
Hemoglobin: 11.6 g/dL — ABNORMAL LOW (ref 13.0–17.0)
MCH: 28.1 pg (ref 26.0–34.0)
MCHC: 32.1 g/dL (ref 30.0–36.0)
MCV: 87.4 fL (ref 80.0–100.0)
Platelets: 304 10*3/uL (ref 150–400)
RBC: 4.13 MIL/uL — ABNORMAL LOW (ref 4.22–5.81)
RDW: 17.6 % — ABNORMAL HIGH (ref 11.5–15.5)
WBC: 13.2 10*3/uL — ABNORMAL HIGH (ref 4.0–10.5)
nRBC: 0 % (ref 0.0–0.2)

## 2020-02-25 LAB — BASIC METABOLIC PANEL
Anion gap: 11 (ref 5–15)
BUN: 30 mg/dL — ABNORMAL HIGH (ref 8–23)
CO2: 23 mmol/L (ref 22–32)
Calcium: 8.3 mg/dL — ABNORMAL LOW (ref 8.9–10.3)
Chloride: 109 mmol/L (ref 98–111)
Creatinine, Ser: 1.16 mg/dL (ref 0.61–1.24)
GFR, Estimated: >60 mL/min (ref >60)
Glucose, Bld: 130 mg/dL — ABNORMAL HIGH (ref 70–99)
Potassium: 4.5 mmol/L (ref 3.5–5.1)
Sodium: 143 mmol/L (ref 135–145)

## 2020-02-25 LAB — GLUCOSE, CAPILLARY: Glucose-Capillary: 119 mg/dL — ABNORMAL HIGH (ref 70–99)

## 2020-02-25 LAB — SURGICAL PCR SCREEN
MRSA, PCR: NEGATIVE
Staphylococcus aureus: NEGATIVE

## 2020-02-25 SURGERY — OPEN REDUCTION INTERNAL FIXATION (ORIF) MANDIBULAR FRACTURE
Anesthesia: General | Site: Mouth | Laterality: Left

## 2020-02-25 MED ORDER — ONDANSETRON HCL 4 MG/2ML IJ SOLN: 4.0000 mg | Freq: Four times a day (QID) | INTRAMUSCULAR | Status: DC | PRN

## 2020-02-25 MED ORDER — EPHEDRINE SULFATE-NACL 50-0.9 MG/10ML-% IV SOSY
PREFILLED_SYRINGE | INTRAVENOUS | Status: DC | PRN
  Administered 2020-02-25 (×3): 5 mg via INTRAVENOUS

## 2020-02-25 MED ORDER — CHLORHEXIDINE GLUCONATE CLOTH 2 % EX PADS
6.0000 | MEDICATED_PAD | Freq: Every day | CUTANEOUS | Status: DC
  Administered 2020-02-25 – 2020-04-04 (×33): 6 via TOPICAL

## 2020-02-25 MED ORDER — PROPOFOL 10 MG/ML IV BOLUS
INTRAVENOUS | Status: AC
  Filled 2020-02-25: qty 20

## 2020-02-25 MED ORDER — SUCCINYLCHOLINE CHLORIDE 20 MG/ML IJ SOLN
INTRAMUSCULAR | Status: DC | PRN
  Administered 2020-02-25: 120 mg via INTRAVENOUS

## 2020-02-25 MED ORDER — ENOXAPARIN SODIUM 40 MG/0.4ML ~~LOC~~ SOLN
40.0000 mg | SUBCUTANEOUS | Status: DC
  Administered 2020-02-26 – 2020-03-04 (×8): 40 mg via SUBCUTANEOUS
  Filled 2020-02-25 (×8): qty 0.4

## 2020-02-25 MED ORDER — ROCURONIUM BROMIDE 10 MG/ML (PF) SYRINGE
PREFILLED_SYRINGE | INTRAVENOUS | Status: AC
  Filled 2020-02-25: qty 10

## 2020-02-25 MED ORDER — 0.9 % SODIUM CHLORIDE (POUR BTL) OPTIME
TOPICAL | Status: DC | PRN
  Administered 2020-02-25: 1000 mL

## 2020-02-25 MED ORDER — PHENYLEPHRINE 40 MCG/ML (10ML) SYRINGE FOR IV PUSH (FOR BLOOD PRESSURE SUPPORT)
PREFILLED_SYRINGE | INTRAVENOUS | Status: AC
  Filled 2020-02-25: qty 10

## 2020-02-25 MED ORDER — HYDROMORPHONE HCL 1 MG/ML IJ SOLN
0.5000 mg | INTRAMUSCULAR | Status: DC
  Administered 2020-02-25 – 2020-03-06 (×33): 0.5 mg via INTRAVENOUS
  Filled 2020-02-25: qty 0.5
  Filled 2020-02-25 (×2): qty 1
  Filled 2020-02-25 (×7): qty 0.5
  Filled 2020-02-25: qty 1
  Filled 2020-02-25 (×3): qty 0.5
  Filled 2020-02-25 (×2): qty 1
  Filled 2020-02-25: qty 0.5
  Filled 2020-02-25: qty 1
  Filled 2020-02-25 (×2): qty 0.5
  Filled 2020-02-25: qty 1
  Filled 2020-02-25: qty 0.5
  Filled 2020-02-25: qty 1
  Filled 2020-02-25 (×4): qty 0.5
  Filled 2020-02-25: qty 1
  Filled 2020-02-25 (×2): qty 0.5
  Filled 2020-02-25: qty 1
  Filled 2020-02-25 (×2): qty 0.5

## 2020-02-25 MED ORDER — DEXAMETHASONE SODIUM PHOSPHATE 4 MG/ML IJ SOLN
INTRAMUSCULAR | Status: DC | PRN
  Administered 2020-02-25: 8 mg via INTRAVENOUS

## 2020-02-25 MED ORDER — MIDAZOLAM HCL 2 MG/2ML IJ SOLN
INTRAMUSCULAR | Status: AC
  Filled 2020-02-25: qty 2

## 2020-02-25 MED ORDER — ESMOLOL HCL 100 MG/10ML IV SOLN
INTRAVENOUS | Status: DC | PRN
  Administered 2020-02-25: 10 mg via INTRAVENOUS

## 2020-02-25 MED ORDER — OXYCODONE HCL 5 MG/5ML PO SOLN: 5.0000 mg | Freq: Once | ORAL | Status: DC | PRN

## 2020-02-25 MED ORDER — OXYCODONE HCL 5 MG PO TABS: 5.0000 mg | ORAL_TABLET | Freq: Once | ORAL | Status: DC | PRN

## 2020-02-25 MED ORDER — HYDRALAZINE HCL 20 MG/ML IJ SOLN
INTRAMUSCULAR | Status: DC | PRN
  Administered 2020-02-25: 2.5 mg via INTRAVENOUS
  Administered 2020-02-25: 5 mg via INTRAVENOUS

## 2020-02-25 MED ORDER — SUCCINYLCHOLINE CHLORIDE 200 MG/10ML IV SOSY
PREFILLED_SYRINGE | INTRAVENOUS | Status: AC
  Filled 2020-02-25: qty 10

## 2020-02-25 MED ORDER — ONDANSETRON HCL 4 MG/2ML IJ SOLN
INTRAMUSCULAR | Status: DC | PRN
  Administered 2020-02-25: 4 mg via INTRAVENOUS

## 2020-02-25 MED ORDER — ONDANSETRON HCL 4 MG/2ML IJ SOLN
INTRAMUSCULAR | Status: AC
  Filled 2020-02-25: qty 2

## 2020-02-25 MED ORDER — LIDOCAINE-EPINEPHRINE 1 %-1:100000 IJ SOLN
INTRAMUSCULAR | Status: DC | PRN
  Administered 2020-02-25: 3 mL

## 2020-02-25 MED ORDER — HEMOSTATIC AGENTS (NO CHARGE) OPTIME
TOPICAL | Status: DC | PRN
  Administered 2020-02-25: 1 via TOPICAL

## 2020-02-25 MED ORDER — LIDOCAINE-EPINEPHRINE 1 %-1:100000 IJ SOLN
INTRAMUSCULAR | Status: AC
  Filled 2020-02-25: qty 1

## 2020-02-25 MED ORDER — FENTANYL CITRATE (PF) 100 MCG/2ML IJ SOLN: 25.0000 ug | INTRAMUSCULAR | Status: DC | PRN

## 2020-02-25 MED ORDER — FENTANYL CITRATE (PF) 250 MCG/5ML IJ SOLN
INTRAMUSCULAR | Status: AC
  Filled 2020-02-25: qty 5

## 2020-02-25 MED ORDER — LACTATED RINGERS IV SOLN: INTRAVENOUS | Status: AC

## 2020-02-25 MED ORDER — LACTATED RINGERS IV SOLN: INTRAVENOUS | Status: DC

## 2020-02-25 MED ORDER — PHENYLEPHRINE HCL-NACL 10-0.9 MG/250ML-% IV SOLN
INTRAVENOUS | Status: DC | PRN
  Administered 2020-02-25: 50 ug/min via INTRAVENOUS

## 2020-02-25 MED ORDER — EPHEDRINE 5 MG/ML INJ
INTRAVENOUS | Status: AC
  Filled 2020-02-25: qty 10

## 2020-02-25 MED ORDER — DEXAMETHASONE SODIUM PHOSPHATE 10 MG/ML IJ SOLN
INTRAMUSCULAR | Status: AC
  Filled 2020-02-25: qty 1

## 2020-02-25 MED ORDER — PHENYLEPHRINE 40 MCG/ML (10ML) SYRINGE FOR IV PUSH (FOR BLOOD PRESSURE SUPPORT)
PREFILLED_SYRINGE | INTRAVENOUS | Status: DC | PRN
  Administered 2020-02-25 (×6): 40 ug via INTRAVENOUS

## 2020-02-25 MED ORDER — PROPOFOL 500 MG/50ML IV EMUL
INTRAVENOUS | Status: DC | PRN
  Administered 2020-02-25: 50 ug/kg/min via INTRAVENOUS

## 2020-02-25 MED ORDER — FENTANYL CITRATE (PF) 100 MCG/2ML IJ SOLN
INTRAMUSCULAR | Status: DC | PRN
  Administered 2020-02-25: 50 ug via INTRAVENOUS
  Administered 2020-02-25: 100 ug via INTRAVENOUS
  Administered 2020-02-25 (×2): 50 ug via INTRAVENOUS

## 2020-02-25 MED ORDER — PROPOFOL 10 MG/ML IV BOLUS
INTRAVENOUS | Status: DC | PRN
  Administered 2020-02-25: 50 mg via INTRAVENOUS
  Administered 2020-02-25: 100 mg via INTRAVENOUS
  Administered 2020-02-25: 20 mg via INTRAVENOUS

## 2020-02-25 SURGICAL SUPPLY — 79 items
BIT DRILL 1.6X50 (BIT) ×2 IMPLANT
BLADE RECIPRO TAPERED (BLADE) IMPLANT
BLADE SURG 10 STRL SS (BLADE) IMPLANT
BLADE SURG 15 STRL LF DISP TIS (BLADE) ×2 IMPLANT
BLADE SURG 15 STRL SS (BLADE) ×2
BUR RND DIAMOND ELITE 3.5 (BURR) IMPLANT
BUR SURG 4X8 MED (BURR) ×1 IMPLANT
BURR SURG 4X8 MED (BURR) ×2
CANISTER SUCT 3000ML PPV (MISCELLANEOUS) ×2 IMPLANT
CLEANER TIP ELECTROSURG 2X2 (MISCELLANEOUS) IMPLANT
COVER SURGICAL LIGHT HANDLE (MISCELLANEOUS) ×2 IMPLANT
COVER WAND RF STERILE (DRAPES) ×2 IMPLANT
DECANTER SPIKE VIAL GLASS SM (MISCELLANEOUS) ×2 IMPLANT
DERMABOND ADVANCED (GAUZE/BANDAGES/DRESSINGS) ×1
DERMABOND ADVANCED .7 DNX12 (GAUZE/BANDAGES/DRESSINGS) ×1 IMPLANT
DRAPE HALF SHEET 40X57 (DRAPES) ×2 IMPLANT
DRSG TEGADERM 4X4.75 (GAUZE/BANDAGES/DRESSINGS) ×2 IMPLANT
ELECT COATED BLADE 2.86 ST (ELECTRODE) ×2 IMPLANT
ELECT NEEDLE BLADE 2-5/6 (NEEDLE) ×2 IMPLANT
ELECT REM PT RETURN 9FT ADLT (ELECTROSURGICAL) ×2
ELECTRODE REM PT RTRN 9FT ADLT (ELECTROSURGICAL) ×1 IMPLANT
GLOVE BIO SURGEON STRL SZ8 (GLOVE) ×8 IMPLANT
GLOVE BIOGEL PI IND STRL 8 (GLOVE) ×2 IMPLANT
GLOVE BIOGEL PI INDICATOR 8 (GLOVE) ×2
GOWN STRL REUS W/ TWL LRG LVL3 (GOWN DISPOSABLE) ×3 IMPLANT
GOWN STRL REUS W/ TWL XL LVL3 (GOWN DISPOSABLE) IMPLANT
GOWN STRL REUS W/TWL LRG LVL3 (GOWN DISPOSABLE) ×3
GOWN STRL REUS W/TWL XL LVL3 (GOWN DISPOSABLE)
HEMOSTAT SURGICEL 2X14 (HEMOSTASIS) ×2 IMPLANT
HOOK DURA (MISCELLANEOUS) ×2 IMPLANT
KIT BASIN OR (CUSTOM PROCEDURE TRAY) ×2 IMPLANT
KIT TURNOVER KIT B (KITS) ×2 IMPLANT
NEEDLE 18GX1X1/2 (RX/OR ONLY) (NEEDLE) ×2 IMPLANT
NEEDLE HYPO 25GX1X1/2 BEV (NEEDLE) ×2 IMPLANT
NS IRRIG 1000ML POUR BTL (IV SOLUTION) ×2 IMPLANT
PAD ARMBOARD 7.5X6 YLW CONV (MISCELLANEOUS) ×4 IMPLANT
PATTIES SURGICAL .5 X3 (DISPOSABLE) ×2 IMPLANT
PENCIL BUTTON HOLSTER BLD 10FT (ELECTRODE) ×2 IMPLANT
PLATE 6H WITH GAP 2.0 (Plate) ×2 IMPLANT
POSITIONER HEAD DONUT 9IN (MISCELLANEOUS) ×2 IMPLANT
PROTECTOR CORNEAL (OPHTHALMIC RELATED) IMPLANT
RETRACTOR STAY HOOK 5MM (MISCELLANEOUS) ×2 IMPLANT
SCISSORS WIRE ANG 4 3/4 DISP (INSTRUMENTS) IMPLANT
SCREW LOCKING X-DR 2.0X10 (Screw) ×4 IMPLANT
SCREW LOCKING X-DR 2.0X12 (Screw) ×4 IMPLANT
SCREW LOCKING X-DR 2.3X10 (Screw) ×4 IMPLANT
SCREW LOCKING X-DR 2.3X12 (Screw) ×4 IMPLANT
SCREW LOCKING X-DR 2.3X16 (Screw) ×2 IMPLANT
SCREW NON LOCK HT 2.0X16 (Screw) ×2 IMPLANT
SCREW NON LOCK X-DR 2.0X14 (Screw) ×2 IMPLANT
SCREW NON LOCK X-DR 2.0X18 (Screw) ×2 IMPLANT
SPONGE INTESTINAL PEANUT (DISPOSABLE) ×8 IMPLANT
SPONGE LAP 18X18 RF (DISPOSABLE) ×4 IMPLANT
STAPLER VISISTAT 35W (STAPLE) ×2 IMPLANT
STRIP CLOSURE SKIN 1/2X4 (GAUZE/BANDAGES/DRESSINGS) ×2 IMPLANT
SUT CHROMIC 3 0 PS 2 (SUTURE) IMPLANT
SUT CHROMIC 3 0 SH 27 (SUTURE) ×2 IMPLANT
SUT CHROMIC 4 0 PS 2 18 (SUTURE) IMPLANT
SUT ETHILON 5 0 P 3 18 (SUTURE)
SUT MNCRL AB 4-0 PS2 18 (SUTURE) ×2 IMPLANT
SUT NYLON ETHILON 5-0 P-3 1X18 (SUTURE) IMPLANT
SUT PROLENE 5 0 PS 2 (SUTURE) IMPLANT
SUT SILK 2 0 PERMA HAND 18 BK (SUTURE) IMPLANT
SUT SILK 3 0 (SUTURE) ×1
SUT SILK 3-0 18XBRD TIE 12 (SUTURE) ×1 IMPLANT
SUT STEEL 0 (SUTURE)
SUT STEEL 0 18XMFL TIE 17 (SUTURE) IMPLANT
SUT STEEL 4 (SUTURE) IMPLANT
SUT VIC AB 3-0 SH 27 (SUTURE)
SUT VIC AB 3-0 SH 27X BRD (SUTURE) IMPLANT
SUT VIC AB 3-0 SH 8-18 (SUTURE) ×2 IMPLANT
SUT VIC AB 4-0 RB1 27 (SUTURE) ×2
SUT VIC AB 4-0 RB1 27X BRD (SUTURE) ×2 IMPLANT
SUT VIC AB 4-0 SH 27 (SUTURE) ×2
SUT VIC AB 4-0 SH 27XANBCTRL (SUTURE) ×2 IMPLANT
TOWEL GREEN STERILE FF (TOWEL DISPOSABLE) ×2 IMPLANT
TRAY ENT MC OR (CUSTOM PROCEDURE TRAY) ×2 IMPLANT
WATER STERILE IRR 1000ML POUR (IV SOLUTION) ×2 IMPLANT
YANKAUER SUCT BULB TIP NO VENT (SUCTIONS) ×2 IMPLANT

## 2020-02-25 NOTE — Plan of Care (Signed)
Patient is alert, non-verbal. Follows commands. Scheduled IV dilaudid for pain. Jaw and lips edematous, pt NPO for surgery today. Condom catheter and voiding clear yellow urine. Fall precautions in place. Problem: Education: Goal: Knowledge of General Education information will improve Description: Including pain rating scale, medication(s)/side effects and non-pharmacologic comfort measures 02/12/2020 0405 by Ruby Cola, RN Outcome: Progressing 02/27/2020 0405 by Ruby Cola, RN Outcome: Progressing   Problem: Health Behavior/Discharge Planning: Goal: Ability to manage health-related needs will improve 03/04/2020 0405 by Ruby Cola, RN Outcome: Progressing 02/07/2020 0405 by Ruby Cola, RN Outcome: Progressing   Problem: Clinical Measurements: Goal: Ability to maintain clinical measurements within normal limits will improve 02/24/2020 0405 by Ruby Cola, RN Outcome: Progressing 02/07/2020 0405 by Ruby Cola, RN Outcome: Progressing Goal: Will remain free from infection 02/24/2020 0405 by Ruby Cola, RN Outcome: Progressing 02/06/2020 0405 by Ruby Cola, RN Outcome: Progressing Goal: Diagnostic test results will improve 02/18/2020 0405 by Ruby Cola, RN Outcome: Progressing 02/24/2020 0405 by Ruby Cola, RN Outcome: Progressing Goal: Respiratory complications will improve 02/12/2020 0405 by Ruby Cola, RN Outcome: Progressing 03/01/2020 0405 by Ruby Cola, RN Outcome: Progressing Goal: Cardiovascular complication will be avoided 02/11/2020 0405 by Ruby Cola, RN Outcome: Progressing 02/17/2020 0405 by Ruby Cola, RN Outcome: Progressing   Problem: Activity: Goal: Risk for activity intolerance will decrease 02/24/2020 0405 by Ruby Cola, RN Outcome: Progressing 03/03/2020 0405 by Ruby Cola, RN Outcome: Progressing   Problem: Nutrition: Goal:  Adequate nutrition will be maintained 03/02/2020 0405 by Ruby Cola, RN Outcome: Progressing 02/20/2020 0405 by Ruby Cola, RN Outcome: Progressing   Problem: Coping: Goal: Level of anxiety will decrease 02/08/2020 0405 by Ruby Cola, RN Outcome: Progressing 02/20/2020 0405 by Ruby Cola, RN Outcome: Progressing   Problem: Elimination: Goal: Will not experience complications related to bowel motility 02/09/2020 0405 by Ruby Cola, RN Outcome: Progressing 02/16/2020 0405 by Ruby Cola, RN Outcome: Progressing Goal: Will not experience complications related to urinary retention 02/13/2020 0405 by Ruby Cola, RN Outcome: Progressing 02/16/2020 0405 by Ruby Cola, RN Outcome: Progressing   Problem: Pain Managment: Goal: General experience of comfort will improve 03/05/2020 0405 by Ruby Cola, RN Outcome: Progressing 02/21/2020 0405 by Ruby Cola, RN Outcome: Progressing   Problem: Safety: Goal: Ability to remain free from injury will improve 02/07/2020 0405 by Ruby Cola, RN Outcome: Progressing 02/29/2020 0405 by Ruby Cola, RN Outcome: Progressing   Problem: Skin Integrity: Goal: Risk for impaired skin integrity will decrease 02/17/2020 0405 by Ruby Cola, RN Outcome: Progressing 02/16/2020 0405 by Ruby Cola, RN Outcome: Progressing

## 2020-02-25 NOTE — Anesthesia Procedure Notes (Signed)
Procedure Name: Intubation Date/Time: 02/19/2020 8:01 AM Performed by: Sammie Bench, CRNA Pre-anesthesia Checklist: Patient identified, Emergency Drugs available, Suction available and Patient being monitored Patient Re-evaluated:Patient Re-evaluated prior to induction Oxygen Delivery Method: Circle System Utilized Preoxygenation: Pre-oxygenation with 100% oxygen Induction Type: IV induction Ventilation: Mask ventilation without difficulty Laryngoscope Size: Mac, 3 and Glidescope Grade View: Grade II Nasal Tubes: Nasal Rae and Nasal prep performed Number of attempts: 1 Placement Confirmation: ETT inserted through vocal cords under direct vision,  positive ETCO2 and breath sounds checked- equal and bilateral Tube secured with: Tape Dental Injury: Teeth and Oropharynx as per pre-operative assessment  Comments: After induction of anesthesia: Afrin spray x 3, nasal trumpets for dilation x 3, Nasal tube advanced before DL with MAC3, hematoma noted to posterior pharyngeal wall, switch to glidescope, cords visualized, tube advanced under direct visualization, +ETC02, + BBS, sutured into place by surgeon.

## 2020-02-25 NOTE — Anesthesia Preprocedure Evaluation (Addendum)
Anesthesia Evaluation  Patient identified by MRN, date of birth, ID band Patient awake    Reviewed: Allergy & Precautions, H&P , NPO status , Patient's Chart, lab work & pertinent test results  Airway Mallampati: III   Neck ROM: full  Mouth opening: Limited Mouth Opening  Dental   Pulmonary neg pulmonary ROS,    breath sounds clear to auscultation       Cardiovascular negative cardio ROS   Rhythm:regular Rate:Normal     Neuro/Psych Subdural hematoma Altered mental status    GI/Hepatic   Endo/Other    Renal/GU      Musculoskeletal   Abdominal   Peds  Hematology   Anesthesia Other Findings   Reproductive/Obstetrics                            Anesthesia Physical Anesthesia Plan  ASA: II  Anesthesia Plan: General   Post-op Pain Management:    Induction: Intravenous  PONV Risk Score and Plan: 2 and Ondansetron, Dexamethasone, Midazolam and Treatment may vary due to age or medical condition  Airway Management Planned: Nasal ETT  Additional Equipment:   Intra-op Plan:   Post-operative Plan: Extubation in OR  Informed Consent: I have reviewed the patients History and Physical, chart, labs and discussed the procedure including the risks, benefits and alternatives for the proposed anesthesia with the patient or authorized representative who has indicated his/her understanding and acceptance.       Plan Discussed with: CRNA, Anesthesiologist and Surgeon  Anesthesia Plan Comments:         Anesthesia Quick Evaluation

## 2020-02-25 NOTE — Transfer of Care (Signed)
Immediate Anesthesia Transfer of Care Note  Patient: Leonard Forbes  Procedure(s) Performed: OPEN REDUCTION INTERNAL FIXATION (ORIF) MANDIBULAR FRACTURE LEFT  AND EXTRACTION OF TEETH  NUMBER 22 AND 23. (Left Mouth)  Patient Location: PACU  Anesthesia Type:General  Level of Consciousness: unresponsive, drowsy and patient cooperative  Airway & Oxygen Therapy: Patient Spontanous Breathing and Patient connected to nasal cannula oxygen  Post-op Assessment: Report given to RN and Post -op Vital signs reviewed and stable  Post vital signs: Reviewed and stable  Last Vitals:  Vitals Value Taken Time  BP 145/95 02/20/2020 1137  Temp    Pulse 92 02/12/2020 1141  Resp 14 02/19/2020 1141  SpO2 94 % 02/26/2020 1141  Vitals shown include unvalidated device data.  Last Pain:  Vitals:   02/14/2020 0500  TempSrc: Oral  PainSc:          Complications: No complications documented.

## 2020-02-25 NOTE — Progress Notes (Signed)
   Subjective: HD 2 for [name] who is a 64 year old male with PMHx of alcohol use disorder, ruptured arterial aneurysm s/p shunt placement, CVA with unknown baseline admitted with left mandibular fracture and altered mental status.  Overnight, no acute events reported.  [name] was evaluated at bedside this morning following his ORIF of left mandibular fracture. He is somnolent and difficult to arouse. He does open his eyes but not following commands or withdrawing to pain.   Objective:  Vital signs in last 24 hours: Vitals:   02/24/20 0834 02/24/20 1136 02/24/20 1934 02/17/2020 0500  BP: (!) 126/111 (!) 146/101 (!) 144/96 132/88  Pulse: 70 79 71 77  Resp: 16 16 15 16   Temp: 98.6 F (37 C) 98.8 F (37.1 C) 98.9 F (37.2 C) 98.5 F (36.9 C)  TempSrc: Axillary Oral Oral Oral  SpO2: 98% 98% 99% 96%  Weight:    53.3 kg   CBC Latest Ref Rng & Units 02/24/2020 02/08/2020  WBC 4.0 - 10.5 K/uL 11.0(H) 11.3(H)  Hemoglobin 13.0 - 17.0 g/dL 12.2(L) 12.7(L)  Hematocrit 39 - 52 % 37.0(L) 42.8  Platelets 150 - 400 K/uL 344 206   BMP Latest Ref Rng & Units 02/24/2020 02/19/2020  Glucose 70 - 99 mg/dL 03/01/2020) 94  BUN 8 - 23 mg/dL 314(H) 70(Y)  Creatinine 0.61 - 1.24 mg/dL 63(Z 8.58  Sodium 8.50 - 145 mmol/L 144 144  Potassium 3.5 - 5.1 mmol/L 4.2 4.3  Chloride 98 - 111 mmol/L 110 113(H)  CO2 22 - 32 mmol/L 25 19(L)  Calcium 8.9 - 10.3 mg/dL 277) 4.1(O)   Physical Exam  Constitutional: chronically malnourished male; No distress.  HENT: left mandible is edematous; poor dentition; PERRL Cardiovascular: Normal rate, regular rhythm, S1 and S2 present, no murmurs, rubs, gallops.  Distal pulses intact Neurological: Intermittently opening eyes but without purposeful movements; does not withdraw to pain at this time; PERRL  Assessment/Plan:  Active Problems:   Mandibular fracture [facility]) Mr [name] is a 64 year old male with prior trauma with ruptured arterial aneurysm  s/p  craniotomy and VP shunt, alcohol use disorder admitted for left mandibular fracture and altered mental status.   Left mandibular fracture s/p ORIF  Subdural hematoma Patient is s/p ORIF during evaluation. No complications from surgery. He is noted to have acute change in mental status without any response to pain on examination. CT Head repeated to evaluate for evolution of subdural hematoma; however, this appears to be stable from prior exam.  - Continue dexamethasone 8mg  q8h - Continue Unasyn 3g q6h  - IV dilaudid 0.5-1mg  q4h for pain - Trend CBC - Frequent neuro checks  - Seizure precautions   Diet: NPO; full liquid once more awake Fluids: LR 75 cc/hr DVT Prophylaxis: Lovenox Code Status: FULL    Prior to Admission Living Arrangement: Unhomed  Anticipated Discharge Location: Pending PT/OT eval  Barriers to Discharge: Continued medical management  Dispo: Anticipated discharge in approximately 3-4 day(s).   41, MD  [facility] PGY-2 02/11/2020, 7:08 AM Pager: [phone] After 5pm on weekdays and 1pm on weekends: On Call pager [phone]

## 2020-02-25 NOTE — OR Nursing (Signed)
At final count missing suture needle so hospital protocol was followed and X- ray obtained. However suture needle was found on floor so final  count was correct prior to patient leaving room. X-ray was read by radiologist  Dr. Wallene Huh who stated that x-ray was negative for suture needle.

## 2020-02-25 NOTE — Progress Notes (Signed)
Myra Rude, CRNA at bedside to assume care. Patient did not verbalize but able to follow basic commands of squeezing hand and holding two fingers up.

## 2020-02-26 DIAGNOSIS — S065X9A Traumatic subdural hemorrhage with loss of consciousness of unspecified duration, initial encounter: Secondary | ICD-10-CM | POA: Diagnosis not present

## 2020-02-26 DIAGNOSIS — S02609A Fracture of mandible, unspecified, initial encounter for closed fracture: Secondary | ICD-10-CM | POA: Diagnosis not present

## 2020-02-26 DIAGNOSIS — Z982 Presence of cerebrospinal fluid drainage device: Secondary | ICD-10-CM | POA: Diagnosis not present

## 2020-02-26 DIAGNOSIS — R400 Somnolence: Secondary | ICD-10-CM

## 2020-02-26 DIAGNOSIS — Z9889 Other specified postprocedural states: Secondary | ICD-10-CM

## 2020-02-26 LAB — RETICULOCYTES
Immature Retic Fract: 13.7 % (ref 2.3–15.9)
RBC.: 4.09 MIL/uL — ABNORMAL LOW (ref 4.22–5.81)
Retic Count, Absolute: 69.5 10*3/uL (ref 19.0–186.0)
Retic Ct Pct: 1.7 % (ref 0.4–3.1)

## 2020-02-26 MED ORDER — CHLORHEXIDINE GLUCONATE 0.12 % MT SOLN
15.0000 mL | Freq: Two times a day (BID) | OROMUCOSAL | Status: DC
  Administered 2020-02-26 – 2020-04-04 (×60): 15 mL via OROMUCOSAL
  Filled 2020-02-26 (×65): qty 15

## 2020-02-26 MED ORDER — ORAL CARE MOUTH RINSE
15.0000 mL | Freq: Two times a day (BID) | OROMUCOSAL | Status: DC
  Administered 2020-02-26 – 2020-05-13 (×77): 15 mL via OROMUCOSAL
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
## 2020-02-26 NOTE — Progress Notes (Signed)
1 Day Post-Op   Subjective/Chief Complaint: POD#1 s/p ORIF left mandible, extraction teeth #22, 23. Patient was taken to the ICU post-operatively for airway monitoring. CT head was obtained showing questionable interval increase in existing subdural hematomas. The patient remained stable overnight. Resting comfortably in bed on my exam, satting 99% on RA.    Objective: Vital signs in last 24 hours: Temp:  [97.7 F (36.5 C)-98.4 F (36.9 C)] 98.4 F (36.9 C) (11/21 0400) Pulse Rate:  [55-134] 60 (11/21 0800) Resp:  [0-47] 12 (11/21 0800) BP: (140-179)/(88-120) 148/120 (11/21 0800) SpO2:  [91 %-100 %] 98 % (11/21 0800) Weight:  [46.8 kg] 46.8 kg (11/21 0500) Last BM Date:  (PTA)  Intake/Output from previous day: 11/20 0701 - 11/21 0700 In: 2794.6 [I.V.:2494.6; IV Piggyback:300] Out: 825 [Urine:775; Blood:50] Intake/Output this shift: Total I/O In: 229 [I.V.:214.7; IV Piggyback:14.3] Out: -   Maxillofacial Exam: Mild edema of the left lower facial 1/3 Incision c/d/i with no evidence of hematoma Unable to assess facial nerve function  Intraoral Exam: Stable hematoma of the left posterior oropharynx  Surgical site closed, hemostatic No purulence   Lab Results:  Recent Labs    02/24/20 0225 02/26/2020 1518  WBC 11.0* 13.2*  HGB 12.2* 11.6*  HCT 37.0* 36.1*  PLT 344 304   BMET Recent Labs    02/24/20 0225 02/11/2020 1518  NA 144 143  K 4.2 4.5  CL 110 109  CO2 25 23  GLUCOSE 121* 130*  BUN 25* 30*  CREATININE 1.05 1.16  CALCIUM 8.4* 8.3*   PT/INR No results for input(s): LABPROT, INR in the last 72 hours. ABG No results for input(s): PHART, HCO3 in the last 72 hours.  Invalid input(s): PCO2, PO2  Studies/Results: DG Chest 1 View  Result Date: 02/17/2020 CLINICAL DATA:  Incorrect needle count, left mandible ORIF EXAM: CHEST  1 VIEW COMPARISON:  None. FINDINGS: Left mandible ORIF changes noted. VP shunt tubing along the right neck and chest. Endotracheal  tube at the T4 level. No visualized unexpected radiopaque foreign body such as a needle. Clear lung apices.  Aorta atherosclerotic and tortuous. IMPRESSION: Expected postoperative findings. No unexpected needle radiopaque foreign body. Electronically Signed   By: Judie Petit.  Shick M.D.   On: 02/07/2020 11:35   CT HEAD WO CONTRAST  Result Date: 02/29/2020 CLINICAL DATA:  64 year old male with altered mental status. Decreasing responsiveness. EXAM: CT HEAD WITHOUT CONTRAST TECHNIQUE: Contiguous axial images were obtained from the base of the skull through the vertex without intravenous contrast. COMPARISON:  Head CT 02/28/2020 and earlier. CT face 02/28/2020. FINDINGS: Brain: Right posterior approach ventriculostomy catheter is stable, terminates through the 3rd ventricle just across midline to the left as before. Stable ventricular system, no ventriculomegaly. However, there is a mixed density extra-axial collection along the left convexity (series 3, image 21) measuring 4 mm which appears mildly increased since August and compatible with small subdural hematoma. Trace right side subdural also suspected. No significant intracranial mass effect or midline shift. No other intracranial hemorrhage identified. Stable gray-white matter differentiation throughout the brain. Confluent bilateral cerebral white matter hypodensity appears stable, greater in the right hemisphere. No cortical encephalomalacia identified. 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 Skull: Stable. Previous right side burr holes and right frontotemporal craniotomy. No acute osseous abnormality identified. 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 Still, this does appear to result in chronic C1-C2 level spinal stenosis (series 3, image 1).  Sinuses/Orbits: Visualized paranasal sinuses and mastoids are stable and well pneumatized. Other: Orbit and scalp soft tissues appear stable, including right posterior convexity shunt reservoir or with no adverse features identified. IMPRESSION: 1. Small left greater than right Subdural Hematomas appears mildly increased since August and raise the possibility of over-shunting in this clinical setting. These are 4 mm or less, without significant intracranial mass effect. 2. Underlying stable CSF shunt with no adverse features. Stable vents without ventriculomegaly. 3. 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 Electronically Signed   By: Odessa Fleming M.D.   On: 2020/03/06 14:11    Anti-infectives: Anti-infectives (From admission, onward)   Start     Dose/Rate Route Frequency Ordered Stop   02/09/2020 1830  Ampicillin-Sulbactam (UNASYN) 3 g in sodium chloride 0.9 % 100 mL IVPB        3 g 200 mL/hr over 30 Minutes Intravenous Every 6 hours 02/26/2020 1824        Assessment/Plan: POD#1 s/p Procedure(s): OPEN REDUCTION INTERNAL FIXATION (ORIF) MANDIBULAR FRACTURE LEFT  AND EXTRACTION OF TEETH  NUMBER 22 AND 23. (Left)  Patient maintaining his airway, mental status appears unchanged from pre-operative state. No expansion of hematoma. Surgical sites closed with no dehiscence.   OK to d/c decadron Patient must maintain a full liquid diet for 6 weeks, then can gradually return to normal Recommend transition to Augmentin for 5 days post-operatively Peridex rinses BID for one week.     LOS: 3 days    Exie Parody 02/26/2020
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
## 2020-02-27 LAB — MAGNESIUM: Magnesium: 1.7 mg/dL (ref 1.7–2.4)

## 2020-02-27 LAB — GLUCOSE, CAPILLARY
Glucose-Capillary: 160 mg/dL — ABNORMAL HIGH (ref 70–99)
Glucose-Capillary: 41 mg/dL — CL (ref 70–99)
Glucose-Capillary: 61 mg/dL — ABNORMAL LOW (ref 70–99)
Glucose-Capillary: 91 mg/dL (ref 70–99)
Glucose-Capillary: 68 mg/dL — ABNORMAL LOW (ref 70–99)
Glucose-Capillary: 126 mg/dL — ABNORMAL HIGH (ref 70–99)

## 2020-02-27 LAB — PHOSPHORUS: Phosphorus: 2.5 mg/dL (ref 2.5–4.6)

## 2020-02-27 MED ORDER — DEXTROSE 50 % IV SOLN
INTRAVENOUS | Status: AC
  Administered 2020-02-27: 25 g via INTRAVENOUS
  Filled 2020-02-27: qty 50

## 2020-02-27 MED ORDER — SENNOSIDES-DOCUSATE SODIUM 8.6-50 MG PO TABS
1.0000 | ORAL_TABLET | Freq: Every evening | ORAL | Status: DC | PRN
  Administered 2020-02-27: 1
  Filled 2020-02-27: qty 1

## 2020-02-27 MED ORDER — THIAMINE HCL 100 MG PO TABS
100.0000 mg | ORAL_TABLET | Freq: Every day | ORAL | Status: DC
  Administered 2020-02-28 – 2020-03-07 (×8): 100 mg
  Filled 2020-02-27 (×8): qty 1

## 2020-02-27 MED ORDER — OSMOLITE 1.2 CAL PO LIQD
1000.0000 mL | ORAL | Status: DC
  Administered 2020-02-27 – 2020-03-01 (×4): 1000 mL
  Filled 2020-02-27 (×14): qty 1000

## 2020-02-27 MED ORDER — PROSOURCE TF PO LIQD
45.0000 mL | Freq: Every day | ORAL | Status: DC
  Administered 2020-02-27 – 2020-03-06 (×8): 45 mL
  Filled 2020-02-27 (×9): qty 45

## 2020-02-27 MED ORDER — DEXTROSE 50 % IV SOLN
50.0000 mL | INTRAVENOUS | Status: AC
  Administered 2020-02-27: 50 mL via INTRAVENOUS

## 2020-02-27 MED ORDER — DEXTROSE 50 % IV SOLN: 25.0000 g | INTRAVENOUS | Status: AC

## 2020-02-27 MED ORDER — DEXTROSE 50 % IV SOLN
INTRAVENOUS | Status: AC
  Filled 2020-02-27: qty 50

## 2020-02-27 MED ORDER — FOLIC ACID 1 MG PO TABS
1.0000 mg | ORAL_TABLET | Freq: Every day | ORAL | Status: DC
  Administered 2020-02-28 – 2020-03-20 (×17): 1 mg
  Filled 2020-02-27 (×20): qty 1

## 2020-02-27 MED ORDER — ADULT MULTIVITAMIN W/MINERALS CH
1.0000 | ORAL_TABLET | Freq: Every day | ORAL | Status: DC
  Administered 2020-02-28 – 2020-03-20 (×17): 1
  Filled 2020-02-27 (×20): qty 1

## 2020-02-27 NOTE — Procedures (Signed)
Cortrak  Person Inserting Tube:  Renie Ora, RD Tube Type:  Cortrak - 43 inches Tube Location:  Right nare Initial Placement:  Stomach Secured by: Bridle Technique Used to Measure Tube Placement:  Documented cm marking at nare/ corner of mouth Cortrak Secured At:  72 cm Procedure Comments:  Cortrak Tube Team Note:  Consult received to place a Cortrak feeding tube.   No x-ray is required. RN may begin using tube.   If the tube becomes dislodged please keep the tube and contact the Cortrak team at [email or web address] (password [email or web address]) for replacement.  If after hours and replacement cannot be delayed, place a NG tube and confirm placement with an abdominal x-ray.       Trenton Gammon, MS, RD, LDN, CNSC Inpatient Clinical Dietitian RD pager # available in AMION  After hours/weekend pager # available in [facility]

## 2020-02-27 NOTE — Procedures (Signed)
Cortrak  Person Inserting Tube:  Jullie Arps M, RD Tube Type:  Cortrak - 43 inches Tube Location:  Right nare Initial Placement:  Stomach Secured by: Bridle Technique Used to Measure Tube Placement:  Documented cm marking at nare/ corner of mouth Cortrak Secured At:  75 cm Procedure Comments:  Cortrak Tube Team Note:  Consult received to place a Cortrak feeding tube.   No x-ray is required. RN may begin using tube.    If the tube becomes dislodged please keep the tube and contact the Cortrak team at [email or web address] (password [email or web address]) for replacement.  If after hours and replacement cannot be delayed, place a NG tube and confirm placement with an abdominal x-ray.      Yasuko Lapage, MS, RD, LDN, CNSC Inpatient Clinical Dietitian RD pager # available in AMION  After hours/weekend pager # available in [facility]ION      
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
## 2020-02-27 NOTE — Progress Notes (Signed)
SLP Cancellation Note  Patient Details Name: Leonard Forbes MRN: [id number] DOB: Mar 25, 1956   Cancelled treatment:        Attempted to see patient for PO trials for readiness to initiate diet.  Pt refused all trials.  Cortrak team arrived to place NG tube.  Pt continued to refuse POs and preferred placeement of Cortrak.    Kerrie Pleasure, MA, CCC-SLP Acute Rehabilitation Services Office: [phone] 02/27/2020, 10:25 AM
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
## 2020-02-28 ENCOUNTER — Encounter (HOSPITAL_COMMUNITY): Payer: Self-pay | Admitting: Oral Surgery

## 2020-02-28 DIAGNOSIS — S02609A Fracture of mandible, unspecified, initial encounter for closed fracture: Secondary | ICD-10-CM | POA: Diagnosis not present

## 2020-02-28 DIAGNOSIS — S065X9A Traumatic subdural hemorrhage with loss of consciousness of unspecified duration, initial encounter: Secondary | ICD-10-CM | POA: Diagnosis not present

## 2020-02-28 DIAGNOSIS — E43 Unspecified severe protein-calorie malnutrition: Secondary | ICD-10-CM | POA: Diagnosis present

## 2020-02-28 DIAGNOSIS — Z9889 Other specified postprocedural states: Secondary | ICD-10-CM | POA: Diagnosis not present

## 2020-02-28 DIAGNOSIS — Z7409 Other reduced mobility: Secondary | ICD-10-CM | POA: Diagnosis not present

## 2020-02-28 LAB — GLUCOSE, CAPILLARY
Glucose-Capillary: 106 mg/dL — ABNORMAL HIGH (ref 70–99)
Glucose-Capillary: 109 mg/dL — ABNORMAL HIGH (ref 70–99)
Glucose-Capillary: 113 mg/dL — ABNORMAL HIGH (ref 70–99)
Glucose-Capillary: 135 mg/dL — ABNORMAL HIGH (ref 70–99)
Glucose-Capillary: 98 mg/dL (ref 70–99)

## 2020-02-28 LAB — MAGNESIUM
Magnesium: 1.9 mg/dL (ref 1.7–2.4)
Magnesium: 2 mg/dL (ref 1.7–2.4)

## 2020-02-28 LAB — PHOSPHORUS
Phosphorus: 3.4 mg/dL (ref 2.5–4.6)
Phosphorus: 3.8 mg/dL (ref 2.5–4.6)

## 2020-02-28 MED ORDER — AMLODIPINE BESYLATE 5 MG PO TABS
5.0000 mg | ORAL_TABLET | Freq: Every day | ORAL | Status: DC
  Administered 2020-02-28: 5 mg via ORAL
  Filled 2020-02-28: qty 1
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
## 2020-02-28 NOTE — Progress Notes (Signed)
Took mittens off and pt pulled at his corpack.  Put mittens back on
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
## 2020-02-28 NOTE — Anesthesia Postprocedure Evaluation (Signed)
Anesthesia Post Note  Patient: Leonard Forbes  Procedure(s) Performed: OPEN REDUCTION INTERNAL FIXATION (ORIF) MANDIBULAR FRACTURE LEFT  AND EXTRACTION OF TEETH  NUMBER 22 AND 23. (Left Mouth)     Patient location during evaluation: PACU Anesthesia Type: General Level of consciousness: awake and alert Pain management: pain level controlled Vital Signs Assessment: post-procedure vital signs reviewed and stable Respiratory status: spontaneous breathing, nonlabored ventilation, respiratory function stable and patient connected to nasal cannula oxygen Cardiovascular status: blood pressure returned to baseline and stable Postop Assessment: no apparent nausea or vomiting Anesthetic complications: no   No complications documented.  Last Vitals:  Vitals:   02/28/20 0812 02/28/20 0900  BP: (!) 157/101 (!) 158/88  Pulse: 66   Resp: 18 18  Temp: 36.7 C   SpO2: 99% 100%    Last Pain:  Vitals:   02/28/20 0812  TempSrc: Oral  PainSc:                  Asberry Lascola S

## 2020-02-28 NOTE — Progress Notes (Signed)
Pt pulling at corpack.  Educated pt to not pull at [employer].

## 2020-02-28 NOTE — Plan of Care (Signed)
  Problem: Education: Goal: Knowledge of General Education information will improve Description Including pain rating scale, medication(s)/side effects and non-pharmacologic comfort measures Outcome: Progressing   
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
## 2020-02-28 NOTE — TOC Progression Note (Signed)
Transition of Care [facility]) - Progression Note    Patient Details  Name: Demian Maisel MRN: [id number] Date of Birth: Sep 04, 1955  Transition of Care [facility]) CM/SW Contact  Janae Bridgeman, RN Phone Number: 02/28/2020, 3:29 PM  Clinical Narrative:    Case management reached out to other numbers associated with family contacts including Yovanni Frenette, brother - [phone] - number disconnected and is not in-service.  Also, Jamar Buchanon, son - [phone] - number is also disconnected.    Patient with possible recent history of admission to [facility] care SNF - called Lynden Ang, CM with [facility] and she is checking to see if there is listed family members and numbers for this patient.  Will continue to follow for family members and possible contact numbers.    Expected Discharge Plan: Skilled Nursing Facility Barriers to Discharge: Homeless with medical needs  Expected Discharge Plan and Services Expected Discharge Plan: Skilled Nursing Facility   Discharge Planning Services: CM Consult Post Acute Care Choice: Skilled Nursing Facility Living arrangements for the past 2 months: Homeless                                       Social Determinants of Health (SDOH) Interventions    Readmission Risk Interventions Readmission Risk Prevention Plan 02/28/2020  Transportation Screening Complete  PCP or Specialist Appt within 5-7 Days Complete  Home Care Screening Complete  Medication Review (RN CM) Complete
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
## 2020-02-29 LAB — GLUCOSE, CAPILLARY
Glucose-Capillary: 104 mg/dL — ABNORMAL HIGH (ref 70–99)
Glucose-Capillary: 101 mg/dL — ABNORMAL HIGH (ref 70–99)
Glucose-Capillary: 84 mg/dL (ref 70–99)
Glucose-Capillary: 99 mg/dL (ref 70–99)
Glucose-Capillary: 100 mg/dL — ABNORMAL HIGH (ref 70–99)

## 2020-02-29 MED ORDER — ACETAMINOPHEN 650 MG RE SUPP
650.0000 mg | Freq: Four times a day (QID) | RECTAL | Status: DC
  Administered 2020-03-01 – 2020-03-02 (×2): 650 mg via RECTAL
  Filled 2020-02-29 (×5): qty 1

## 2020-02-29 MED ORDER — AMLODIPINE BESYLATE 10 MG PO TABS
10.0000 mg | ORAL_TABLET | Freq: Every day | ORAL | Status: DC
  Filled 2020-02-29: qty 1

## 2020-02-29 MED ORDER — ACETAMINOPHEN 325 MG PO TABS
650.0000 mg | ORAL_TABLET | Freq: Four times a day (QID) | ORAL | Status: DC
  Administered 2020-02-29 – 2020-03-07 (×16): 650 mg
  Filled 2020-02-29 (×20): qty 2

## 2020-02-29 MED ORDER — AMLODIPINE BESYLATE 10 MG PO TABS
10.0000 mg | ORAL_TABLET | Freq: Every day | ORAL | Status: DC
  Administered 2020-02-29 – 2020-03-06 (×6): 10 mg
  Filled 2020-02-29 (×5): qty 1

## 2020-02-29 MED ORDER — AMLODIPINE BESYLATE 10 MG PO TABS: 10.0000 mg | ORAL_TABLET | Freq: Every day | ORAL | Status: DC

## 2020-02-29 MED ORDER — SENNOSIDES-DOCUSATE SODIUM 8.6-50 MG PO TABS
2.0000 | ORAL_TABLET | Freq: Every day | ORAL | Status: DC
  Administered 2020-03-08 – 2020-03-19 (×10): 2
  Filled 2020-02-29 (×13): qty 2

## 2020-02-29 NOTE — Plan of Care (Signed)
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
## 2020-03-01 DIAGNOSIS — S02609A Fracture of mandible, unspecified, initial encounter for closed fracture: Secondary | ICD-10-CM | POA: Diagnosis not present

## 2020-03-01 LAB — CBC
HCT: 37 % — ABNORMAL LOW (ref 39.0–52.0)
Hemoglobin: 12 g/dL — ABNORMAL LOW (ref 13.0–17.0)
MCH: 28.8 pg (ref 26.0–34.0)
MCHC: 32.4 g/dL (ref 30.0–36.0)
MCV: 88.7 fL (ref 80.0–100.0)
Platelets: 357 10*3/uL (ref 150–400)
RBC: 4.17 MIL/uL — ABNORMAL LOW (ref 4.22–5.81)
RDW: 17.1 % — ABNORMAL HIGH (ref 11.5–15.5)
WBC: 10.3 10*3/uL (ref 4.0–10.5)
nRBC: 0 % (ref 0.0–0.2)

## 2020-03-01 LAB — GLUCOSE, CAPILLARY
Glucose-Capillary: 103 mg/dL — ABNORMAL HIGH (ref 70–99)
Glucose-Capillary: 107 mg/dL — ABNORMAL HIGH (ref 70–99)
Glucose-Capillary: 110 mg/dL — ABNORMAL HIGH (ref 70–99)
Glucose-Capillary: 115 mg/dL — ABNORMAL HIGH (ref 70–99)
Glucose-Capillary: 121 mg/dL — ABNORMAL HIGH (ref 70–99)
Glucose-Capillary: 124 mg/dL — ABNORMAL HIGH (ref 70–99)

## 2020-03-01 NOTE — Plan of Care (Signed)
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
## 2020-03-02 LAB — GLUCOSE, CAPILLARY
Glucose-Capillary: 125 mg/dL — ABNORMAL HIGH (ref 70–99)
Glucose-Capillary: 85 mg/dL (ref 70–99)
Glucose-Capillary: 87 mg/dL (ref 70–99)
Glucose-Capillary: 90 mg/dL (ref 70–99)
Glucose-Capillary: 96 mg/dL (ref 70–99)
Glucose-Capillary: 97 mg/dL (ref 70–99)
Glucose-Capillary: 99 mg/dL (ref 70–99)

## 2020-03-02 NOTE — Progress Notes (Signed)
Turned and repositioned he will tun himself back on his back
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
## 2020-03-03 DIAGNOSIS — S02609A Fracture of mandible, unspecified, initial encounter for closed fracture: Secondary | ICD-10-CM | POA: Diagnosis not present

## 2020-03-03 DIAGNOSIS — G9340 Encephalopathy, unspecified: Secondary | ICD-10-CM | POA: Diagnosis not present

## 2020-03-03 LAB — GLUCOSE, CAPILLARY
Glucose-Capillary: 114 mg/dL — ABNORMAL HIGH (ref 70–99)
Glucose-Capillary: 125 mg/dL — ABNORMAL HIGH (ref 70–99)
Glucose-Capillary: 125 mg/dL — ABNORMAL HIGH (ref 70–99)
Glucose-Capillary: 65 mg/dL — ABNORMAL LOW (ref 70–99)
Glucose-Capillary: 90 mg/dL (ref 70–99)
Glucose-Capillary: 97 mg/dL (ref 70–99)

## 2020-03-03 NOTE — Progress Notes (Signed)
Attempted/encouraged to get him to eat some food tried varity- including some 'magic cup' pudding- and glucerna he wouldn't make an effort- and turned his head
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
## 2020-03-04 DIAGNOSIS — S02609A Fracture of mandible, unspecified, initial encounter for closed fracture: Secondary | ICD-10-CM | POA: Diagnosis not present

## 2020-03-04 LAB — COMPREHENSIVE METABOLIC PANEL
ALT: 34 U/L (ref 0–44)
AST: 30 U/L (ref 15–41)
Albumin: 2.7 g/dL — ABNORMAL LOW (ref 3.5–5.0)
Alkaline Phosphatase: 64 U/L (ref 38–126)
Anion gap: 12 (ref 5–15)
BUN: 25 mg/dL — ABNORMAL HIGH (ref 8–23)
CO2: 24 mmol/L (ref 22–32)
Calcium: 8.5 mg/dL — ABNORMAL LOW (ref 8.9–10.3)
Chloride: 102 mmol/L (ref 98–111)
Creatinine, Ser: 1.03 mg/dL (ref 0.61–1.24)
GFR, Estimated: >60 mL/min (ref >60)
Glucose, Bld: 128 mg/dL — ABNORMAL HIGH (ref 70–99)
Potassium: 4.5 mmol/L (ref 3.5–5.1)
Sodium: 138 mmol/L (ref 135–145)
Total Bilirubin: 0.3 mg/dL (ref 0.3–1.2)
Total Protein: 6.2 g/dL — ABNORMAL LOW (ref 6.5–8.1)

## 2020-03-04 LAB — PHOSPHORUS: Phosphorus: 3.8 mg/dL (ref 2.5–4.6)

## 2020-03-04 LAB — CBC
HCT: 39 % (ref 39.0–52.0)
Hemoglobin: 12.6 g/dL — ABNORMAL LOW (ref 13.0–17.0)
MCH: 28.4 pg (ref 26.0–34.0)
MCHC: 32.3 g/dL (ref 30.0–36.0)
MCV: 87.8 fL (ref 80.0–100.0)
Platelets: 450 10*3/uL — ABNORMAL HIGH (ref 150–400)
RBC: 4.44 MIL/uL (ref 4.22–5.81)
RDW: 16.9 % — ABNORMAL HIGH (ref 11.5–15.5)
WBC: 10.1 10*3/uL (ref 4.0–10.5)
nRBC: 0 % (ref 0.0–0.2)

## 2020-03-04 LAB — MAGNESIUM: Magnesium: 1.8 mg/dL (ref 1.7–2.4)

## 2020-03-04 LAB — HEMOGLOBIN AND HEMATOCRIT, BLOOD
HCT: 34.9 % — ABNORMAL LOW (ref 39.0–52.0)
Hemoglobin: 11.4 g/dL — ABNORMAL LOW (ref 13.0–17.0)

## 2020-03-04 LAB — GLUCOSE, CAPILLARY
Glucose-Capillary: 103 mg/dL — ABNORMAL HIGH (ref 70–99)
Glucose-Capillary: 121 mg/dL — ABNORMAL HIGH (ref 70–99)
Glucose-Capillary: 123 mg/dL — ABNORMAL HIGH (ref 70–99)
Glucose-Capillary: 134 mg/dL — ABNORMAL HIGH (ref 70–99)
Glucose-Capillary: 85 mg/dL (ref 70–99)
Glucose-Capillary: 89 mg/dL (ref 70–99)
Glucose-Capillary: 97 mg/dL (ref 70–99)

## 2020-03-04 MED ORDER — LACTATED RINGERS IV BOLUS
500.0000 mL | Freq: Once | INTRAVENOUS | Status: AC
  Administered 2020-03-04: 500 mL via INTRAVENOUS

## 2020-03-04 MED ORDER — PANTOPRAZOLE SODIUM 40 MG IV SOLR
40.0000 mg | INTRAVENOUS | Status: DC
  Administered 2020-03-04: 40 mg via INTRAVENOUS
  Filled 2020-03-04: qty 40

## 2020-03-04 MED ORDER — LACTATED RINGERS IV SOLN: INTRAVENOUS | Status: AC

## 2020-03-04 NOTE — Progress Notes (Addendum)
   03/04/20 1636  Vitals  Temp 98 F (36.7 C)  Temp Source Oral  BP 100/79  MAP (mmHg) 86  BP Location Left Arm  BP Method Automatic  Patient Position (if appropriate) Lying  Pulse Rate (!) 122  Pulse Rate Source Dinamap  Resp 19  Level of Consciousness  Level of Consciousness Alert  MEWS COLOR  MEWS Score Color Yellow  Oxygen Therapy  SpO2 100 %  O2 Device Room Air  Pain Assessment  Pain Scale PAINAD  Pain Score 0  PAINAD (Pain Assessment in Advanced Dementia)  Breathing 0  Negative Vocalization 0  Facial Expression 0  Body Language 0  Consolability 0  PAINAD Score 0  MEWS Score  MEWS Temp 0  MEWS Systolic 1  MEWS Pulse 2  MEWS RR 0  MEWS LOC 0  MEWS Score 3  Provider Notification  Provider Name/Title Dr. Mcarthur Rossetti  Date Provider Notified 03/04/20  Time Provider Notified [phone]  Notification Type Call  Notification Reason Change in status  Response See new orders  Date of Provider Response 03/04/20  Time of Provider Response 1639  Rapid Response Notification  Name of Rapid Response RN Notified n/a    Noted large bloody stool, vital signs taken and recorded. MD notified, received new orders, Charge RN notified. Pt alert, oriented to self (responds to name when called). Denies pain 0/10, will continue to monitor.    18:39 - Pt reassessed, vital signs taken and recorded. Pt not in distress, will continue to monitor and endorse accordingly to oncoming RN.

## 2020-03-04 NOTE — Plan of Care (Signed)
  Problem: Clinical Measurements: Goal: Ability to maintain clinical measurements within normal limits will improve Outcome: Progressing   Problem: Activity: Goal: Risk for activity intolerance will decrease Outcome: Progressing   Problem: Nutrition: Goal: Adequate nutrition will be maintained Outcome: Progressing   Problem: Coping: Goal: Level of anxiety will decrease Outcome: Progressing   Problem: Elimination: Goal: Will not experience complications related to bowel motility Outcome: Progressing   Problem: Safety: Goal: Ability to remain free from injury will improve Outcome: Progressing   Problem: Skin Integrity: Goal: Risk for impaired skin integrity will decrease Outcome: Progressing   

## 2020-03-04 NOTE — Progress Notes (Signed)
   Subjective: HD 9 Overnight, no acute events reported.  [name] was evaluated at bedside this morning. He is resting comfortably in bed and denies any pain at this time. He does endorse hunger but does not wish to eat.   Objective:  Vital signs in last 24 hours: Vitals:   03/03/20 0700 03/03/20 1441 03/03/20 1900 03/04/20 0425  BP: (!) 130/99 (!) 127/98 (!) 128/102 (!) 115/96  Pulse: 70 67 94 98  Resp: 16 17 16 16   Temp: 98 F (36.7 C) 97.8 F (36.6 C) 97.6 F (36.4 C) 98 F (36.7 C)  TempSrc: Axillary Axillary Axillary Axillary  SpO2: 100% 100% 99% 100%  Weight:      Height:       CBC Latest Ref Rng & Units 03/04/2020 03/01/2020 02/13/2020  WBC 4.0 - 10.5 K/uL 10.1 10.3 13.2(H)  Hemoglobin 13.0 - 17.0 g/dL 12.6(L) 12.0(L) 11.6(L)  Hematocrit 39 - 52 % 39.0 37.0(L) 36.1(L)  Platelets 150 - 400 K/uL 450(H) 357 304   BMP Latest Ref Rng & Units 03/04/2020 02/08/2020 02/24/2020  Glucose 70 - 99 mg/dL 02/26/2020) 401(U) 272(Z)  BUN 8 - 23 mg/dL 366(Y) 40(H) 47(Q)  Creatinine 0.61 - 1.24 mg/dL 25(Z 5.63 8.75  Sodium 135 - 145 mmol/L 138 143 144  Potassium 3.5 - 5.1 mmol/L 4.5 4.5 4.2  Chloride 98 - 111 mmol/L 102 109 110  CO2 22 - 32 mmol/L 24 23 25   Calcium 8.9 - 10.3 mg/dL 6.43) 8.3(L) 8.4(L)   Physical Exam  Constitutional: Appears well-developed and well-nourished. No distress.  HENT: L lower mandible remains edematous; minimally opening mouth; poor dentition  Cardiovascular: Normal rate, regular rhythm, S1 and S2 present, no murmurs, rubs, gallops.  Distal pulses intact Respiratory: No respiratory distress, no accessory muscle use.  Effort is normal.   Assessment/Plan:  Principal Problem:   Mandibular fracture (HCC) Active Problems:   Protein-calorie malnutrition, severe Mr Kyshaun Barnette is a 64 year old male with PMHx   L mandibular fracture s/p ORIF Subdural hematoma POD8 of ORIF of patient's left mandibular fracture. He is awake and alert. He answers in  1-2 word sentences. Currently denies any pain. Does endorse hunger but does not wish to eat at this time. He is continued on cortrak at this time with plans to advance diet to liquids as tolerated. - Tylenol 650mg  q6h, IV dilaudid 0.5mg  q6h - Neuro checks - Advance diet as tolerated  Disposition Remains uncertain as no family member available and unclear baseline mentation for this patient.   Diet: Cortrak, advance to full liquid as tolerated Fluids: None DVT Prophylaxis: Lovenox Code status: FULL   Prior to Admission Living Arrangement: Unknown Anticipated Discharge Location: SNF Barriers to Discharge: medical management  Dispo: Anticipated discharge pending clinical improvement.   [name], MD 03/04/2020, 7:05 AM Pager: @MYPAGER @ After 5pm on weekdays and 1pm on weekends: On Call pager [phone]

## 2020-03-04 NOTE — Progress Notes (Signed)
Tech spent >30 minutes feeding him- she got him to eat 40% of tray- approximately 20 minutes to half hour- hear him retching- got to room  to find him lying over side rail vomiting copiously- noted blood clots mixed in with food and tube feeding-along with that he had had another large frank blood bowel movement- along with stomach contents his feeding tube had also come out his mouth. Obviously tube feeding stopped and completed removal of tube from his nare. Complete bath and bed change performed- vs checked and IMTS on call notified of situation and low blood pressure- fluid bolus ordered and administered- scds in place- feeding tube to be left out at this time as discussed with on call intern.continue to monitor.

## 2020-03-05 ENCOUNTER — Encounter (HOSPITAL_COMMUNITY): Admission: EM | Disposition: E | Payer: Self-pay | Source: Home / Self Care | Attending: Internal Medicine

## 2020-03-05 ENCOUNTER — Inpatient Hospital Stay (HOSPITAL_COMMUNITY): Payer: Medicare Other | Admitting: Certified Registered Nurse Anesthetist

## 2020-03-05 ENCOUNTER — Encounter (HOSPITAL_COMMUNITY): Payer: Self-pay | Admitting: Internal Medicine

## 2020-03-05 DIAGNOSIS — K920 Hematemesis: Secondary | ICD-10-CM

## 2020-03-05 DIAGNOSIS — K921 Melena: Secondary | ICD-10-CM | POA: Diagnosis not present

## 2020-03-05 DIAGNOSIS — S02609A Fracture of mandible, unspecified, initial encounter for closed fracture: Secondary | ICD-10-CM | POA: Diagnosis not present

## 2020-03-05 DIAGNOSIS — S065X9A Traumatic subdural hemorrhage with loss of consciousness of unspecified duration, initial encounter: Secondary | ICD-10-CM | POA: Diagnosis not present

## 2020-03-05 HISTORY — PX: ESOPHAGOGASTRODUODENOSCOPY (EGD) WITH PROPOFOL: SHX5813

## 2020-03-05 LAB — GLUCOSE, CAPILLARY
Glucose-Capillary: 84 mg/dL (ref 70–99)
Glucose-Capillary: 84 mg/dL (ref 70–99)
Glucose-Capillary: 98 mg/dL (ref 70–99)
Glucose-Capillary: 88 mg/dL (ref 70–99)

## 2020-03-05 LAB — HEMOGLOBIN AND HEMATOCRIT, BLOOD
HCT: 30.8 % — ABNORMAL LOW (ref 39.0–52.0)
Hemoglobin: 10.4 g/dL — ABNORMAL LOW (ref 13.0–17.0)

## 2020-03-05 LAB — CBC
HCT: 29.3 % — ABNORMAL LOW (ref 39.0–52.0)
Hemoglobin: 9.1 g/dL — ABNORMAL LOW (ref 13.0–17.0)
MCH: 28.1 pg (ref 26.0–34.0)
MCHC: 31.1 g/dL (ref 30.0–36.0)
MCV: 90.4 fL (ref 80.0–100.0)
Platelets: 417 10*3/uL — ABNORMAL HIGH (ref 150–400)
RBC: 3.24 MIL/uL — ABNORMAL LOW (ref 4.22–5.81)
RDW: 17.2 % — ABNORMAL HIGH (ref 11.5–15.5)
WBC: 13.9 10*3/uL — ABNORMAL HIGH (ref 4.0–10.5)
nRBC: 0 % (ref 0.0–0.2)

## 2020-03-05 LAB — BASIC METABOLIC PANEL
Anion gap: 8 (ref 5–15)
BUN: 35 mg/dL — ABNORMAL HIGH (ref 8–23)
CO2: 25 mmol/L (ref 22–32)
Calcium: 8.2 mg/dL — ABNORMAL LOW (ref 8.9–10.3)
Chloride: 105 mmol/L (ref 98–111)
Creatinine, Ser: 1.09 mg/dL (ref 0.61–1.24)
GFR, Estimated: >60 mL/min (ref >60)
Glucose, Bld: 107 mg/dL — ABNORMAL HIGH (ref 70–99)
Potassium: 4.6 mmol/L (ref 3.5–5.1)
Sodium: 138 mmol/L (ref 135–145)

## 2020-03-05 LAB — TYPE AND SCREEN
ABO/RH(D): A POS
Antibody Screen: NEGATIVE

## 2020-03-05 LAB — ABO/RH: ABO/RH(D): A POS

## 2020-03-05 LAB — PROTIME-INR
INR: 1.1 (ref 0.8–1.2)
Prothrombin Time: 13.8 seconds (ref 11.4–15.2)

## 2020-03-05 LAB — APTT: aPTT: 32 seconds (ref 24–36)

## 2020-03-05 SURGERY — ESOPHAGOGASTRODUODENOSCOPY (EGD) WITH PROPOFOL
Anesthesia: Monitor Anesthesia Care

## 2020-03-05 MED ORDER — PANTOPRAZOLE SODIUM 40 MG IV SOLR
40.0000 mg | Freq: Two times a day (BID) | INTRAVENOUS | Status: DC
  Administered 2020-03-05 – 2020-03-15 (×21): 40 mg via INTRAVENOUS
  Filled 2020-03-05 (×21): qty 40

## 2020-03-05 MED ORDER — PROPOFOL 10 MG/ML IV BOLUS
INTRAVENOUS | Status: DC | PRN
  Administered 2020-03-05 (×3): 20 mg via INTRAVENOUS

## 2020-03-05 MED ORDER — LACTATED RINGERS IV SOLN: INTRAVENOUS | Status: DC | PRN

## 2020-03-05 MED ORDER — LIDOCAINE 2% (20 MG/ML) 5 ML SYRINGE
INTRAMUSCULAR | Status: DC | PRN
  Administered 2020-03-05: 60 mg via INTRAVENOUS

## 2020-03-05 MED ORDER — PHENYLEPHRINE 40 MCG/ML (10ML) SYRINGE FOR IV PUSH (FOR BLOOD PRESSURE SUPPORT)
PREFILLED_SYRINGE | INTRAVENOUS | Status: DC | PRN
  Administered 2020-03-05 (×5): 80 ug via INTRAVENOUS

## 2020-03-05 MED ORDER — PROPOFOL 500 MG/50ML IV EMUL
INTRAVENOUS | Status: DC | PRN
  Administered 2020-03-05: 150 ug/kg/min via INTRAVENOUS

## 2020-03-05 SURGICAL SUPPLY — 14 items
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
## 2020-03-05 NOTE — Anesthesia Preprocedure Evaluation (Signed)
Anesthesia Evaluation  Patient identified by MRN, date of birth, ID band Patient awake    Reviewed: Allergy & Precautions, H&P , NPO status , Patient's Chart, lab work & pertinent test results  Airway Mallampati: III   Neck ROM: full  Mouth opening: Limited Mouth Opening  Dental   Pulmonary neg pulmonary ROS,    breath sounds clear to auscultation       Cardiovascular negative cardio ROS   Rhythm:regular Rate:Normal     Neuro/Psych Subdural hematoma Altered mental status    GI/Hepatic   Endo/Other    Renal/GU      Musculoskeletal   Abdominal   Peds  Hematology  (+) Blood dyscrasia, anemia ,   Anesthesia Other Findings   Reproductive/Obstetrics                            Anesthesia Physical  Anesthesia Plan  ASA: III  Anesthesia Plan: MAC   Post-op Pain Management:    Induction: Intravenous  PONV Risk Score and Plan: 2 and Ondansetron, Dexamethasone, Midazolam and Treatment may vary due to age or medical condition  Airway Management Planned: Natural Airway, Nasal Cannula and Mask  Additional Equipment:   Intra-op Plan:   Post-operative Plan: Extubation in OR  Informed Consent: I have reviewed the patients History and Physical, chart, labs and discussed the procedure including the risks, benefits and alternatives for the proposed anesthesia with the patient or authorized representative who has indicated his/her understanding and acceptance.       Plan Discussed with: CRNA, Anesthesiologist and Surgeon  Anesthesia Plan Comments:         Anesthesia Quick Evaluation

## 2020-03-05 NOTE — Progress Notes (Signed)
   03/01/2020 1324  Assess: MEWS Score  Temp 98.5 F (36.9 C)  BP 92/72  Pulse Rate (!) 105  Resp 17  Level of Consciousness Alert  SpO2 97 %  O2 Device Room Air  Assess: MEWS Score  MEWS Temp 0  MEWS Systolic 1  MEWS Pulse 1  MEWS RR 0  MEWS LOC 0  MEWS Score 2  MEWS Score Color Yellow  Treat  Pain Scale PAINAD  Breathing 0  Negative Vocalization 0  Facial Expression 0  Body Language 0  Consolability 0  PAINAD Score 0  Patient was previously yellow.  Will complete Q4hr vital signs.

## 2020-03-05 NOTE — Transfer of Care (Signed)
Immediate Anesthesia Transfer of Care Note  Patient: Leonard Forbes  Procedure(s) Performed: ESOPHAGOGASTRODUODENOSCOPY (EGD) WITH PROPOFOL (N/A )  Patient Location: Endoscopy Unit  Anesthesia Type:MAC  Level of Consciousness: drowsy  Airway & Oxygen Therapy: Patient Spontanous Breathing and Patient connected to nasal cannula oxygen  Post-op Assessment: Report given to RN and Post -op Vital signs reviewed and stable  Post vital signs: Reviewed  Last Vitals:  Vitals Value Taken Time  BP    Temp    Pulse 103 02/28/2020 1651  Resp 13 02/14/2020 1651  SpO2 99 % 02/16/2020 1651  Vitals shown include unvalidated device data.  Last Pain:  Vitals:   02/22/2020 1530  TempSrc: Temporal  PainSc:          Complications: No complications documented.

## 2020-03-05 NOTE — Op Note (Signed)
[facility] Patient Name: Leonard Forbes Procedure Date : 02/26/2020 MRN: [id number] Attending MD: Vida Rigger , MD Date of Birth: [birth year] CSN: [id number] Age: 64 Admit Type: Inpatient Procedure:                Upper GI endoscopy Indications:              Hematemesis, Melena Providers:                Vida Rigger, MD, Clearnce Sorrel, RN, Lawson Radar,                            Technician, Koren Bound, CRNA Referring MD:              Medicines:                Propofol per Anesthesia Complications:            No immediate complications. Estimated Blood Loss:     Estimated blood loss: none. Estimated blood loss:                            none. Procedure:                Pre-Anesthesia Assessment:                           - Prior to the procedure, a History and Physical                            was performed, and patient medications and                            allergies were reviewed. The patient's tolerance of                            previous anesthesia was also reviewed. The risks                            and benefits of the procedure and the sedation                            options and risks were discussed with the patient.                            All questions were answered, and informed consent                            was obtained. Prior Anticoagulants: The patient has                            taken no previous anticoagulant or antiplatelet                            agents. ASA Grade Assessment: II - A patient with  mild systemic disease. After reviewing the risks                            and benefits, the patient was deemed in                            satisfactory condition to undergo the procedure.                           After obtaining informed consent, the endoscope was                            passed under direct vision. Throughout the                            procedure, the patient's blood  pressure, pulse, and                            oxygen saturations were monitored continuously. The                            GIF-H190 ([id number] Olympus gastroscope was                            introduced through the mouth, and advanced to the                            second part of duodenum. The upper GI endoscopy was                            accomplished without difficulty. The patient                            tolerated the procedure well. Scope In: Scope Out: Findings:      A small hiatal hernia was present.      A few localized small erosions with no stigmata of recent bleeding were       found in the gastric antrum.      One non-bleeding medium sized cratered duodenal ulcer with adherent clot       was found in the duodenal bulb. We tried to wash and suction the clot       off without success but elected not to proceed with any further therapy      The second portion of the duodenum was normal.      The exam was otherwise without abnormality.      One other small non-bleeding cratered duodenal ulcer with no stigmata of       bleeding was found in the duodenal bulb. Impression:               - Small hiatal hernia.                           - Erosive gastropathy with no stigmata of recent  bleeding.                           - Non-bleeding medium-sized duodenal ulcer with                            adherent clot could not washed or suctioned off.                           - Normal second portion of the duodenum.                           - The examination was otherwise normal.                           - Non-bleeding duodenal ulcer with no stigmata of                            bleeding.                           - No specimens collected. Recommendation:           - Resume previous diet today. Repeat endoscopy as                            needed the signs of continual bleeding                           - Continue present medications. Continue  twice                            daily pump inhibitors for 1 month and then decrease                            to once a day long-term- Return to GI clinic PRN.                           - Telephone GI clinic if symptomatic PRN. Check H.                            pylori stool antigen or breath test or blood test                            at some point and treat if positive and avoid                            aspirin and nonsteroidals long-term Procedure Code(s):        --- Professional ---                           [phone], Esophagogastroduodenoscopy, flexible,                            transoral; diagnostic, including collection of  specimen(s) by brushing or washing, when performed                            (separate procedure) Diagnosis Code(s):        --- Professional ---                           K44.9, Diaphragmatic hernia without obstruction or                            gangrene                           K31.89, Other diseases of stomach and duodenum                           K26.4, Chronic or unspecified duodenal ulcer with                            hemorrhage                           K92.0, Hematemesis                           K92.1, Melena (includes Hematochezia) CPT copyright 2019 American Medical Association. All rights reserved. The codes documented in this report are preliminary and upon coder review may  be revised to meet current compliance requirements. Vida RiggerMarc Rogene Meth, MD 02/22/2020 5:08:13 PM This report has been signed electronically. Number of Addenda: 0

## 2020-03-05 NOTE — Significant Event (Signed)
Rapid Response Event Note   Reason for Call :  Acute oxygen desaturation, lethargy Pt just returned from having an EGD.  Initial Focused Assessment:  Pt lying in bed. Gag reflex intact. Intermittently moaning. Opens eyes to painful stimuli. Pt moving all extremities. Per RN, he moans at baseline. Lung sounds are clear. Abdomen is soft. Pt placed on 5LNC with improvement in SpO2 to 100%.   VS: T 97.41F, BP 104/77, HR 105, RR 16, SpO2 100% on 5LNC CBG: 88  Interventions:  No intervention from RRRN  Plan of Care:  -Continuous pulse oximetry monitoring -Wean oxygen as pt tolerates -Suction set-up at [facility]  Call rapid response for additional needs  Event Summary:  MD Notified: RN to notify Call Time: 1736 Arrival Time: 1740 End Time: 1755  Jennye Moccasin, RN

## 2020-03-05 NOTE — Consult Note (Signed)
Reason for Consult: Hematemesis and melena Referring Physician: Trauma team  Leonard Forbes is an 64 y.o. male.  HPI: Patient seen and examined and discussed with one of the team members in his hospital computer chart reviewed and he actually has a second computer chart under the same name and birthday and that was reviewed as well and is unable to answer questions but occasionally he shakes his head in the proper direction and he did throw up some blood yesterday and have 2 days of black stools and a decrease in hemoglobin and we are consulted for consideration of endoscopy and he does not have any family or a power of attorney but an endoscopy was discussed with the hospital team and they are in agreement with proceeding urgently  History reviewed. No pertinent past medical history.  Past Surgical History:  Procedure Laterality Date  . ORIF MANDIBULAR FRACTURE Left 03-04-20   Procedure: OPEN REDUCTION INTERNAL FIXATION (ORIF) MANDIBULAR FRACTURE LEFT  AND EXTRACTION OF TEETH  NUMBER 22 AND 23.;  Surgeon: Exie Parody, DMD;  Location: [facility] OR;  Service: Oral Surgery;  Laterality: Left;    History reviewed. No pertinent family history.  Social History:  has no history on file for tobacco use, alcohol use, and drug use.  Allergies: No Known Allergies  Medications: I have reviewed the patient's current medications.  Results for orders placed or performed during the hospital encounter of 02/11/2020 (from the past 48 hour(s))  Glucose, capillary     Status: Abnormal   Collection Time: 03/03/20  4:14 PM  Result Value Ref Range   Glucose-Capillary 125 (H) 70 - 99 mg/dL    Comment: Glucose reference range applies only to samples taken after fasting for at least 8 hours.  Glucose, capillary     Status: None   Collection Time: 03/03/20  8:46 PM  Result Value Ref Range   Glucose-Capillary 90 70 - 99 mg/dL    Comment: Glucose reference range applies only to samples taken after fasting  for at least 8 hours.   Comment 1 Document in Chart   Glucose, capillary     Status: Abnormal   Collection Time: 03/03/20 11:43 PM  Result Value Ref Range   Glucose-Capillary 65 (L) 70 - 99 mg/dL    Comment: Glucose reference range applies only to samples taken after fasting for at least 8 hours.   Comment 1 Document in Chart   Glucose, capillary     Status: None   Collection Time: 03/04/20 12:59 AM  Result Value Ref Range   Glucose-Capillary 85 70 - 99 mg/dL    Comment: Glucose reference range applies only to samples taken after fasting for at least 8 hours.   Comment 1 Document in Chart   CBC     Status: Abnormal   Collection Time: 03/04/20  2:59 AM  Result Value Ref Range   WBC 10.1 4.0 - 10.5 K/uL   RBC 4.44 4.22 - 5.81 MIL/uL   Hemoglobin 12.6 (L) 13.0 - 17.0 g/dL   HCT 41.9 39 - 52 %   MCV 87.8 80.0 - 100.0 fL   MCH 28.4 26.0 - 34.0 pg   MCHC 32.3 30.0 - 36.0 g/dL   RDW 37.9 (H) 02.4 - 09.7 %   Platelets 450 (H) 150 - 400 K/uL   nRBC 0.0 0.0 - 0.2 %    Comment: Performed at [facility], [address] [place], [place] [address]  Comprehensive metabolic panel  Status: Abnormal   Collection Time: 03/04/20  2:59 AM  Result Value Ref Range   Sodium 138 135 - 145 mmol/L   Potassium 4.5 3.5 - 5.1 mmol/L   [place] 102 98 - 111 mmol/L   CO2 24 22 - 32 mmol/L   Glucose, Bld 128 (H) 70 - 99 mg/dL    Comment: Glucose reference range applies only to samples taken after fasting for at least 8 hours.   BUN 25 (H) 8 - 23 mg/dL   Creatinine, Ser 1.611.03 0.61 - 1.24 mg/dL   Calcium 8.5 (L) 8.9 - 10.3 mg/dL   Total Protein 6.2 (L) 6.5 - 8.1 g/dL   Albumin 2.7 (L) 3.5 - 5.0 g/dL   AST 30 15 - 41 U/L   ALT 34 0 - 44 U/L   Alkaline Phosphatase 64 38 - 126 U/L   Total Bilirubin 0.3 0.3 - 1.2 mg/dL   GFR, Estimated >09>60 >60>60 mL/min    Comment: (NOTE) Calculated using the CKD-EPI Creatinine Equation (2021)    Anion gap 12 5 - 15    Comment: Performed at [facility], [address] [place], [place] [address]  Phosphorus     Status: None   Collection Time: 03/04/20  2:59 AM  Result Value Ref Range   Phosphorus 3.8 2.5 - 4.6 mg/dL    Comment: Performed at [facility], [address] [place], [place] [address]  Magnesium     Status: None   Collection Time: 03/04/20  2:59 AM  Result Value Ref Range   Magnesium 1.8 1.7 - 2.4 mg/dL    Comment: Performed at [facility], [address] [place], [place] [address]  Glucose, capillary     Status: Abnormal   Collection Time: 03/04/20  4:29 AM  Result Value Ref Range   Glucose-Capillary 134 (H) 70 - 99 mg/dL    Comment: Glucose reference range applies only to samples taken after fasting for at least 8 hours.   Comment 1 Document in Chart   Glucose, capillary     Status: None   Collection Time: 03/04/20  8:04 AM  Result Value Ref Range   Glucose-Capillary 89 70 - 99 mg/dL    Comment: Glucose reference range applies only to samples taken after fasting for at least 8 hours.  Glucose, capillary     Status: Abnormal   Collection Time: 03/04/20 11:07 AM  Result Value Ref Range   Glucose-Capillary 103 (H) 70 - 99 mg/dL    Comment: Glucose reference range applies only to samples taken after fasting for at least 8 hours.  Glucose, capillary     Status: Abnormal   Collection Time: 03/04/20  4:29 PM  Result Value Ref Range   Glucose-Capillary 121 (H) 70 - 99 mg/dL    Comment: Glucose reference range applies only to samples taken after fasting for at least 8 hours.  Hemoglobin and hematocrit, blood     Status: Abnormal   Collection Time: 03/04/20  4:56 PM  Result Value Ref Range   Hemoglobin 11.4 (L) 13.0 - 17.0 g/dL   HCT 56.234.9 (L) 39 - 52 %    Comment: Performed at [facility], [address] [place], [place] [address]  Glucose, capillary     Status: None   Collection Time: 03/04/20  8:17 PM  Result Value Ref Range   Glucose-Capillary 97 70 - 99 mg/dL    Comment: Glucose reference range  applies only to  samples taken after fasting for at least 8 hours.  Glucose, capillary     Status: Abnormal   Collection Time: 03/04/20 11:51 PM  Result Value Ref Range   Glucose-Capillary 123 (H) 70 - 99 mg/dL    Comment: Glucose reference range applies only to samples taken after fasting for at least 8 hours.  Hemoglobin and hematocrit, blood     Status: Abnormal   Collection Time: 03/04/2020  4:00 AM  Result Value Ref Range   Hemoglobin 10.4 (L) 13.0 - 17.0 g/dL   HCT 52.7 (L) 39 - 52 %    Comment: Performed at [facility], [address] [place], [place] [address]  Glucose, capillary     Status: None   Collection Time: 02/06/2020  5:46 AM  Result Value Ref Range   Glucose-Capillary 84 70 - 99 mg/dL    Comment: Glucose reference range applies only to samples taken after fasting for at least 8 hours.   Comment 1 Document in Chart   CBC     Status: Abnormal   Collection Time: 02/06/2020  6:37 AM  Result Value Ref Range   WBC 13.9 (H) 4.0 - 10.5 K/uL   RBC 3.24 (L) 4.22 - 5.81 MIL/uL   Hemoglobin 9.1 (L) 13.0 - 17.0 g/dL   HCT 35.3 (L) 39 - 52 %   MCV 90.4 80.0 - 100.0 fL   MCH 28.1 26.0 - 34.0 pg   MCHC 31.1 30.0 - 36.0 g/dL   RDW 61.4 (H) 43.1 - 54.0 %   Platelets 417 (H) 150 - 400 K/uL   nRBC 0.0 0.0 - 0.2 %    Comment: Performed at [facility], [address], [place], [place] [address]  ABO/Rh     Status: None   Collection Time: 02/08/2020  6:37 AM  Result Value Ref Range   ABO/RH(D)      A POS Performed at [facility], [address] [place], [place] [address]   Glucose, capillary     Status: None   Collection Time: 03/01/2020  8:29 AM  Result Value Ref Range   Glucose-Capillary 84 70 - 99 mg/dL    Comment: Glucose reference range applies only to samples taken after fasting for at least 8 hours.  Protime-INR     Status: None   Collection Time: 02/10/2020 11:21 AM  Result Value Ref Range   Prothrombin Time 13.8 11.4 - 15.2 seconds   INR 1.1 0.8 - 1.2     Comment: (NOTE) INR goal varies based on device and disease states. Performed at [facility], [address], [place], [place] [address]   APTT     Status: None   Collection Time: 02/15/2020 11:21 AM  Result Value Ref Range   aPTT 32 24 - 36 seconds    Comment: Performed at [facility], [address] [place], [place] [address]  Basic metabolic panel     Status: Abnormal   Collection Time: 02/29/2020 11:21 AM  Result Value Ref Range   Sodium 138 135 - 145 mmol/L   Potassium 4.6 3.5 - 5.1 mmol/L   [place] 105 98 - 111 mmol/L   CO2 25 22 - 32 mmol/L   Glucose, Bld 107 (H) 70 - 99 mg/dL    Comment: Glucose reference range applies only to samples taken after fasting for at least 8 hours.   BUN 35 (H) 8 - 23 mg/dL   Creatinine, Ser 9.98 0.61 -  1.24 mg/dL   Calcium 8.2 (L) 8.9 - 10.3 mg/dL   GFR, Estimated >65 >68 mL/min    Comment: (NOTE) Calculated using the CKD-EPI Creatinine Equation (2021)    Anion gap 8 5 - 15    Comment: Performed at [facility], [address] [place], [place] [address]  Type and screen [facility]     Status: None   Collection Time: 03-15-2020 11:22 AM  Result Value Ref Range   ABO/RH(D) A POS    Antibody Screen NEG    Sample Expiration      03/08/2020,2359 Performed at [facility], [address], [place], [place] [address]   Glucose, capillary     Status: None   Collection Time: 03/15/20 11:55 AM  Result Value Ref Range   Glucose-Capillary 98 70 - 99 mg/dL    Comment: Glucose reference range applies only to samples taken after fasting for at least 8 hours.    No results found.  Review of Systems unobtainable Blood pressure 98/75, pulse (!) 108, temperature (!) 97.1 F (36.2 C), temperature source Temporal, resp. rate 15, height 5\' 5"  (1.651 m), weight 56.2 kg, SpO2 97 %. Physical Exam please see if pre assessment evaluation increased BUN decreased hemoglobin INR okay platelets and MCV  okay  Assessment/Plan: Upper GI bleeding Plan: At the request of the hospital team we will proceed with urgent endoscopy with further work-up and plans pending those findings  [facility] E Mar 15, 2020, 3:36 PM

## 2020-03-05 NOTE — Plan of Care (Signed)

## 2020-03-05 NOTE — Progress Notes (Addendum)
STATUS UPDATE:  Concerns regarding patient's capacity and ability to consent for today's esophagogastroduodenoscopy. His mentation has been waxing and waning since admission, only intermittently able to follow commands. We are unclear of patient's baseline mentation. Multiple attempts have been made throughout admission to call patient's listed family, including Mr. Rabon Scholle (brother) and Mr. Cheyenne Adas (son). All attempts have been unsuccessful. Given that the patient's hemoglobin has dropped significantly over the last 48 hours, and given that in the usual state, a usual person with appropriate capacity would consent to this procedure, will go ahead and proceed with esophagogastroduodenoscopy later today, scheduled by Dr. Ewing Schlein with [facility].  Evlyn Kanner, MD Internal Medicine PGY-1 [phone]
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
## 2020-03-05 NOTE — Progress Notes (Signed)
SLP Cancellation Note  Patient Details Name: Sigifredo Pignato MRN: [id number] DOB: 1955/12/15   Cancelled treatment:       Reason Eval/Treat Not Completed: Patient at procedure or test/unavailable. Plan for EGD today   Jodiann Ognibene, Riley Nearing 03/01/2020, 1:55 PM

## 2020-03-05 NOTE — Anesthesia Postprocedure Evaluation (Signed)
Anesthesia Post Note  Patient: Froilan Mclean Abramovich  Procedure(s) Performed: ESOPHAGOGASTRODUODENOSCOPY (EGD) WITH PROPOFOL (N/A )     Patient location during evaluation: PACU Anesthesia Type: MAC Level of consciousness: awake and alert Pain management: pain level controlled Vital Signs Assessment: post-procedure vital signs reviewed and stable Respiratory status: spontaneous breathing, nonlabored ventilation, respiratory function stable and patient connected to nasal cannula oxygen Cardiovascular status: stable and blood pressure returned to baseline Postop Assessment: no apparent nausea or vomiting Anesthetic complications: no   No complications documented.  Last Vitals:  Vitals:   02/19/2020 1824 02/10/2020 1939  BP:    Pulse:    Resp:    Temp:    SpO2: 98% 100%    Last Pain:  Vitals:   02/24/2020 1939  TempSrc:   PainSc: Asleep                 Tayleigh Wetherell

## 2020-03-06 DIAGNOSIS — I10 Essential (primary) hypertension: Secondary | ICD-10-CM

## 2020-03-06 DIAGNOSIS — R000 Tachycardia, unspecified: Secondary | ICD-10-CM | POA: Diagnosis not present

## 2020-03-06 DIAGNOSIS — K279 Peptic ulcer, site unspecified, unspecified as acute or chronic, without hemorrhage or perforation: Secondary | ICD-10-CM

## 2020-03-06 DIAGNOSIS — S065X9A Traumatic subdural hemorrhage with loss of consciousness of unspecified duration, initial encounter: Secondary | ICD-10-CM | POA: Diagnosis not present

## 2020-03-06 DIAGNOSIS — E43 Unspecified severe protein-calorie malnutrition: Secondary | ICD-10-CM

## 2020-03-06 DIAGNOSIS — S02609A Fracture of mandible, unspecified, initial encounter for closed fracture: Secondary | ICD-10-CM | POA: Diagnosis not present

## 2020-03-06 LAB — CBC
HCT: 26.4 % — ABNORMAL LOW (ref 39.0–52.0)
Hemoglobin: 8.6 g/dL — ABNORMAL LOW (ref 13.0–17.0)
MCH: 28.9 pg (ref 26.0–34.0)
MCHC: 32.6 g/dL (ref 30.0–36.0)
MCV: 88.6 fL (ref 80.0–100.0)
Platelets: 335 10*3/uL (ref 150–400)
RBC: 2.98 MIL/uL — ABNORMAL LOW (ref 4.22–5.81)
RDW: 17 % — ABNORMAL HIGH (ref 11.5–15.5)
WBC: 10.7 10*3/uL — ABNORMAL HIGH (ref 4.0–10.5)
nRBC: 0 % (ref 0.0–0.2)

## 2020-03-06 LAB — GLUCOSE, CAPILLARY
Glucose-Capillary: 88 mg/dL (ref 70–99)
Glucose-Capillary: 88 mg/dL (ref 70–99)
Glucose-Capillary: 88 mg/dL (ref 70–99)
Glucose-Capillary: 89 mg/dL (ref 70–99)
Glucose-Capillary: 94 mg/dL (ref 70–99)
Glucose-Capillary: 99 mg/dL (ref 70–99)
Glucose-Capillary: 99 mg/dL (ref 70–99)

## 2020-03-06 LAB — BASIC METABOLIC PANEL
Anion gap: 11 (ref 5–15)
BUN: 30 mg/dL — ABNORMAL HIGH (ref 8–23)
CO2: 23 mmol/L (ref 22–32)
Calcium: 8.4 mg/dL — ABNORMAL LOW (ref 8.9–10.3)
Chloride: 103 mmol/L (ref 98–111)
Creatinine, Ser: 1.1 mg/dL (ref 0.61–1.24)
GFR, Estimated: >60 mL/min (ref >60)
Glucose, Bld: 94 mg/dL (ref 70–99)
Potassium: 4.4 mmol/L (ref 3.5–5.1)
Sodium: 137 mmol/L (ref 135–145)

## 2020-03-06 MED ORDER — AMLODIPINE BESYLATE 5 MG PO TABS
5.0000 mg | ORAL_TABLET | Freq: Every day | ORAL | Status: DC
  Filled 2020-03-06: qty 1

## 2020-03-06 MED ORDER — ENSURE ENLIVE PO LIQD
237.0000 mL | Freq: Three times a day (TID) | ORAL | Status: DC
  Administered 2020-03-06 – 2020-05-13 (×154): 237 mL via ORAL
  Filled 2020-03-06 (×2): qty 237

## 2020-03-06 MED ORDER — ONDANSETRON HCL 4 MG PO TABS: 4.0000 mg | ORAL_TABLET | Freq: Four times a day (QID) | ORAL | Status: DC | PRN

## 2020-03-06 MED ORDER — HYDROMORPHONE HCL 1 MG/ML IJ SOLN: 0.5000 mg | INTRAMUSCULAR | Status: DC | PRN

## 2020-03-06 MED ORDER — ONDANSETRON HCL 4 MG/2ML IJ SOLN: 4.0000 mg | Freq: Four times a day (QID) | INTRAMUSCULAR | Status: DC | PRN
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
## 2020-03-06 NOTE — Progress Notes (Signed)
Leonard Forbes 9:53 AM  Subjective: Patient seen and examined and case discussed with his nurse and he has not had a bowel movement today and he denies any abdominal pain and no obvious new complaints Objective: Vital signs stable afebrile no acute distress abdomen is soft nontender BUN slight decrease hemoglobin slight decrease  Assessment: Peptic ulcer disease  Plan: Okay to slowly advance diet if patient is able to swallow and will check on tomorrow  [facility]  office [phone] After 5PM or if no answer call [phone]
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
## 2020-03-07 ENCOUNTER — Encounter (HOSPITAL_COMMUNITY): Payer: Self-pay | Admitting: Internal Medicine

## 2020-03-07 DIAGNOSIS — S02609A Fracture of mandible, unspecified, initial encounter for closed fracture: Secondary | ICD-10-CM | POA: Diagnosis not present

## 2020-03-07 DIAGNOSIS — E43 Unspecified severe protein-calorie malnutrition: Secondary | ICD-10-CM | POA: Diagnosis not present

## 2020-03-07 DIAGNOSIS — G9340 Encephalopathy, unspecified: Secondary | ICD-10-CM | POA: Diagnosis present

## 2020-03-07 DIAGNOSIS — F1010 Alcohol abuse, uncomplicated: Secondary | ICD-10-CM | POA: Diagnosis not present

## 2020-03-07 LAB — CBC WITH DIFFERENTIAL/PLATELET
Abs Immature Granulocytes: 0.1 10*3/uL — ABNORMAL HIGH (ref 0.00–0.07)
Basophils Absolute: 0 10*3/uL (ref 0.0–0.1)
Basophils Relative: 0 %
Eosinophils Absolute: 0.3 10*3/uL (ref 0.0–0.5)
Eosinophils Relative: 2 %
HCT: 25.6 % — ABNORMAL LOW (ref 39.0–52.0)
Hemoglobin: 8.5 g/dL — ABNORMAL LOW (ref 13.0–17.0)
Immature Granulocytes: 1 %
Lymphocytes Relative: 18 %
Lymphs Abs: 1.9 10*3/uL (ref 0.7–4.0)
MCH: 29.1 pg (ref 26.0–34.0)
MCHC: 33.2 g/dL (ref 30.0–36.0)
MCV: 87.7 fL (ref 80.0–100.0)
Monocytes Absolute: 1 10*3/uL (ref 0.1–1.0)
Monocytes Relative: 9 %
Neutro Abs: 7.4 10*3/uL (ref 1.7–7.7)
Neutrophils Relative %: 70 %
Platelets: 400 10*3/uL (ref 150–400)
RBC: 2.92 MIL/uL — ABNORMAL LOW (ref 4.22–5.81)
RDW: 16.9 % — ABNORMAL HIGH (ref 11.5–15.5)
WBC: 10.7 10*3/uL — ABNORMAL HIGH (ref 4.0–10.5)
nRBC: 0.2 % (ref 0.0–0.2)

## 2020-03-07 LAB — GLUCOSE, CAPILLARY
Glucose-Capillary: 117 mg/dL — ABNORMAL HIGH (ref 70–99)
Glucose-Capillary: 64 mg/dL — ABNORMAL LOW (ref 70–99)
Glucose-Capillary: 82 mg/dL (ref 70–99)
Glucose-Capillary: 83 mg/dL (ref 70–99)
Glucose-Capillary: 89 mg/dL (ref 70–99)
Glucose-Capillary: 89 mg/dL (ref 70–99)
Glucose-Capillary: 92 mg/dL (ref 70–99)

## 2020-03-07 MED ORDER — ACETAMINOPHEN 650 MG RE SUPP: 650.0000 mg | Freq: Four times a day (QID) | RECTAL | Status: DC | PRN

## 2020-03-07 MED ORDER — ACETAMINOPHEN 325 MG PO TABS: 650.0000 mg | ORAL_TABLET | Freq: Four times a day (QID) | ORAL | Status: DC | PRN

## 2020-03-07 MED ORDER — DEXTROSE 50 % IV SOLN
12.5000 g | INTRAVENOUS | Status: AC
  Administered 2020-03-07: 12.5 g via INTRAVENOUS

## 2020-03-07 MED ORDER — THIAMINE HCL 100 MG/ML IJ SOLN
500.0000 mg | Freq: Three times a day (TID) | INTRAVENOUS | Status: AC
  Administered 2020-03-07 – 2020-03-10 (×9): 500 mg via INTRAVENOUS
  Filled 2020-03-07 (×9): qty 5

## 2020-03-07 MED ORDER — DEXTROSE 50 % IV SOLN
INTRAVENOUS | Status: AC
  Filled 2020-03-07: qty 50

## 2020-03-07 MED ORDER — LACTATED RINGERS IV SOLN: INTRAVENOUS | Status: DC
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
## 2020-03-07 NOTE — Progress Notes (Signed)
[facility] Gastroenterology Progress Note  Leonard Forbes 64 y.o. [birth year]  CC:  Upper GI bleeding  Subjective: Denies abdominal pain.  Last documented BM was 11/29.  ROS : Review of Systems  Unable to perform ROS: Medical condition  Patient intermittently responds to yes/no questions   Objective: Vital signs in last 24 hours: Vitals:   03/06/20 1900 03/07/20 0753  BP: 100/84 101/69  Pulse: 94 66  Resp: 16 17  Temp: 98 F (36.7 C) 97.8 F (36.6 C)  SpO2: 98% 98%    Physical Exam:  General:  Lethargic, sleeping, no distress, mittens in place  Eyes:  Mild conjunctival pallor, EOMs intact  Lungs:   Clear to auscultation bilaterally, respirations unlabored  Heart:  Regular rate and rhythm, S1, S2 normal  Abdomen:   Soft, non-tender, non-distended, normoactive bowel sounds, no guarding or peritoneal signs  Extremities: Extremities normal, atraumatic, no  edema    Lab Results: Recent Labs    02/17/2020 1121 03/06/20 0436  NA 138 137  K 4.6 4.4  CL 105 103  CO2 25 23  GLUCOSE 107* 94  BUN 35* 30*  CREATININE 1.09 1.10  CALCIUM 8.2* 8.4*   No results for input(s): AST, ALT, ALKPHOS, BILITOT, PROT, ALBUMIN in the last 72 hours. Recent Labs    03/06/20 0436 03/07/20 0154  WBC 10.7* 10.7*  NEUTROABS  --  7.4  HGB 8.6* 8.5*  HCT 26.4* 25.6*  MCV 88.6 87.7  PLT 335 400   Recent Labs    02/10/2020 1121  LABPROT 13.8  INR 1.1    Assessment: Upper GI Bleeding: Hgb 8.5, stable as compared to 8.6 yesterday -EGD yesterday revealed non-bleeding medium-sized duodenal ulcer with adherent clot could not washed or suctioned off, erosive gastropathy with no stigmata of recent bleeding, non-bleeding duodenal ulcer with no stigmata of bleeding.  Plan:  Patient remains at risk of rebleeding.  If feeding tube placed, nasogastric only, with caution due to duodenal bulb ulcer.  Continue Protonix 40 mg IV BID.  Continue to monitor H&H with transfusion as needed to  maintain Hgb >7.  [facility] will follow.  Edrick Kins PA-C 03/07/2020, 9:57 AM  Contact #  [phone]

## 2020-03-07 NOTE — Plan of Care (Signed)
Pt did not want to take his meds or eat earlier today. Finally was able to get him to eat some around 4:30. Pt does not verbally communicate. Pt sleeps a majority of the day. Vital signs have remained stable. Will continue to monitor.   Problem: Education: Goal: Knowledge of General Education information will improve Description: Including pain rating scale, medication(s)/side effects and non-pharmacologic comfort measures Outcome: Not Progressing   Problem: Health Behavior/Discharge Planning: Goal: Ability to manage health-related needs will improve Outcome: Not Progressing   Problem: Nutrition: Goal: Adequate nutrition will be maintained Outcome: Not Progressing   Problem: Safety: Goal: Ability to remain free from injury will improve Outcome: Not Progressing   Problem: Skin Integrity: Goal: Risk for impaired skin integrity will decrease Outcome: Not Progressing
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
## 2020-03-07 NOTE — Plan of Care (Signed)
  Problem: Education: Goal: Knowledge of General Education information will improve Description: Including pain rating scale, medication(s)/side effects and non-pharmacologic comfort measures Outcome: Progressing   Problem: Health Behavior/Discharge Planning: Goal: Ability to manage health-related needs will improve Outcome: Progressing   Problem: Activity: Goal: Risk for activity intolerance will decrease Outcome: Progressing   Problem: Nutrition: Goal: Adequate nutrition will be maintained Outcome: Progressing   Problem: Pain Managment: Goal: General experience of comfort will improve Outcome: Progressing   Problem: Skin Integrity: Goal: Risk for impaired skin integrity will decrease Outcome: Progressing   
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
## 2020-03-07 DEATH — deceased

## 2020-03-08 ENCOUNTER — Inpatient Hospital Stay (HOSPITAL_COMMUNITY): Payer: Medicare Other

## 2020-03-08 DIAGNOSIS — G9340 Encephalopathy, unspecified: Secondary | ICD-10-CM | POA: Diagnosis not present

## 2020-03-08 DIAGNOSIS — S02609A Fracture of mandible, unspecified, initial encounter for closed fracture: Secondary | ICD-10-CM | POA: Diagnosis not present

## 2020-03-08 LAB — CBC WITH DIFFERENTIAL/PLATELET
Abs Immature Granulocytes: 0.05 10*3/uL (ref 0.00–0.07)
Basophils Absolute: 0.1 10*3/uL (ref 0.0–0.1)
Basophils Relative: 1 %
Eosinophils Absolute: 0.3 10*3/uL (ref 0.0–0.5)
Eosinophils Relative: 3 %
HCT: 26.6 % — ABNORMAL LOW (ref 39.0–52.0)
Hemoglobin: 8.6 g/dL — ABNORMAL LOW (ref 13.0–17.0)
Immature Granulocytes: 1 %
Lymphocytes Relative: 19 %
Lymphs Abs: 1.7 10*3/uL (ref 0.7–4.0)
MCH: 28.8 pg (ref 26.0–34.0)
MCHC: 32.3 g/dL (ref 30.0–36.0)
MCV: 89 fL (ref 80.0–100.0)
Monocytes Absolute: 0.9 10*3/uL (ref 0.1–1.0)
Monocytes Relative: 10 %
Neutro Abs: 6.1 10*3/uL (ref 1.7–7.7)
Neutrophils Relative %: 66 %
Platelets: UNDETERMINED 10*3/uL (ref 150–400)
RBC: 2.99 MIL/uL — ABNORMAL LOW (ref 4.22–5.81)
RDW: 16.8 % — ABNORMAL HIGH (ref 11.5–15.5)
WBC: 9.1 10*3/uL (ref 4.0–10.5)
nRBC: 0.2 % (ref 0.0–0.2)

## 2020-03-08 LAB — GLUCOSE, CAPILLARY
Glucose-Capillary: 118 mg/dL — ABNORMAL HIGH (ref 70–99)
Glucose-Capillary: 75 mg/dL (ref 70–99)
Glucose-Capillary: 79 mg/dL (ref 70–99)
Glucose-Capillary: 80 mg/dL (ref 70–99)
Glucose-Capillary: 81 mg/dL (ref 70–99)
Glucose-Capillary: 85 mg/dL (ref 70–99)

## 2020-03-08 LAB — BASIC METABOLIC PANEL
Anion gap: 9 (ref 5–15)
BUN: 22 mg/dL (ref 8–23)
CO2: 21 mmol/L — ABNORMAL LOW (ref 22–32)
Calcium: 8.5 mg/dL — ABNORMAL LOW (ref 8.9–10.3)
Chloride: 107 mmol/L (ref 98–111)
Creatinine, Ser: 1.04 mg/dL (ref 0.61–1.24)
GFR, Estimated: >60 mL/min (ref >60)
Glucose, Bld: 100 mg/dL — ABNORMAL HIGH (ref 70–99)
Potassium: 3.7 mmol/L (ref 3.5–5.1)
Sodium: 137 mmol/L (ref 135–145)

## 2020-03-08 MED ORDER — DEXTROSE IN LACTATED RINGERS 5 % IV SOLN: INTRAVENOUS | Status: DC

## 2020-03-08 NOTE — Plan of Care (Signed)
  Problem: Activity: Goal: Risk for activity intolerance will decrease Outcome: Progressing   Problem: Nutrition: Goal: Adequate nutrition will be maintained Outcome: Progressing   
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
## 2020-03-08 NOTE — Progress Notes (Signed)
[place] Charon Wohler 9:09 AM  Subjective: Patient without signs of bleeding and no obvious change in his medical condition and he does deny pain  Objective: Vital signs stable afebrile no acute distress exam unchanged hemoglobin stable  Assessment: Peptic ulcer disease  Plan: Care with aspirin and nonsteroidals going forward probably need long-term pump inhibitors please call me back if I can be of any further assistance with this hospital stay and if H. pylori antigen is positive will need elective treatment for that in the future  [facility]  office [phone] After 5PM or if no answer call [phone]
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
## 2020-03-08 NOTE — Plan of Care (Signed)
Pt did not take his medications for me again today. He barely ate any of his food after trying to feed him. He ate some breakfast this morning but that was about it. PT tried working with him and he started getting aggressive. Vital signs have remained stable and blood sugars were WNL. Will continue to monitor and try to feed more.   Problem: Education: Goal: Knowledge of General Education information will improve Description: Including pain rating scale, medication(s)/side effects and non-pharmacologic comfort measures Outcome: Not Progressing   Problem: Health Behavior/Discharge Planning: Goal: Ability to manage health-related needs will improve Outcome: Not Progressing   Problem: Activity: Goal: Risk for activity intolerance will decrease Outcome: Not Progressing   Problem: Nutrition: Goal: Adequate nutrition will be maintained Outcome: Not Progressing   Problem: Safety: Goal: Ability to remain free from injury will improve Outcome: Not Progressing   Problem: Skin Integrity: Goal: Risk for impaired skin integrity will decrease Outcome: Not Progressing

## 2020-03-08 NOTE — Progress Notes (Signed)
Patient blood sugar dropped to 64 tonight.hypoglycemia protocol followed and administered 12.5mg  D50 and rechecked blood sugar up to 117.Notified Dr.Jinwala.No new orders received.

## 2020-03-09 ENCOUNTER — Inpatient Hospital Stay (HOSPITAL_COMMUNITY): Payer: Medicare Other

## 2020-03-09 ENCOUNTER — Other Ambulatory Visit (HOSPITAL_COMMUNITY): Payer: Medicare Other

## 2020-03-09 ENCOUNTER — Inpatient Hospital Stay: Payer: Self-pay

## 2020-03-09 DIAGNOSIS — G9340 Encephalopathy, unspecified: Secondary | ICD-10-CM | POA: Diagnosis not present

## 2020-03-09 DIAGNOSIS — F1020 Alcohol dependence, uncomplicated: Secondary | ICD-10-CM | POA: Diagnosis present

## 2020-03-09 DIAGNOSIS — F1010 Alcohol abuse, uncomplicated: Secondary | ICD-10-CM | POA: Diagnosis not present

## 2020-03-09 DIAGNOSIS — S02609A Fracture of mandible, unspecified, initial encounter for closed fracture: Secondary | ICD-10-CM

## 2020-03-09 DIAGNOSIS — E43 Unspecified severe protein-calorie malnutrition: Secondary | ICD-10-CM | POA: Diagnosis not present

## 2020-03-09 DIAGNOSIS — G936 Cerebral edema: Secondary | ICD-10-CM

## 2020-03-09 LAB — CBC WITH DIFFERENTIAL/PLATELET
Abs Immature Granulocytes: 0.05 10*3/uL (ref 0.00–0.07)
Basophils Absolute: 0 10*3/uL (ref 0.0–0.1)
Basophils Relative: 0 %
Eosinophils Absolute: 0.3 10*3/uL (ref 0.0–0.5)
Eosinophils Relative: 3 %
HCT: 24.1 % — ABNORMAL LOW (ref 39.0–52.0)
Hemoglobin: 7.8 g/dL — ABNORMAL LOW (ref 13.0–17.0)
Immature Granulocytes: 1 %
Lymphocytes Relative: 21 %
Lymphs Abs: 1.9 10*3/uL (ref 0.7–4.0)
MCH: 29 pg (ref 26.0–34.0)
MCHC: 32.4 g/dL (ref 30.0–36.0)
MCV: 89.6 fL (ref 80.0–100.0)
Monocytes Absolute: 0.9 10*3/uL (ref 0.1–1.0)
Monocytes Relative: 10 %
Neutro Abs: 5.8 10*3/uL (ref 1.7–7.7)
Neutrophils Relative %: 65 %
Platelets: 480 10*3/uL — ABNORMAL HIGH (ref 150–400)
RBC: 2.69 MIL/uL — ABNORMAL LOW (ref 4.22–5.81)
RDW: 17.2 % — ABNORMAL HIGH (ref 11.5–15.5)
WBC: 8.9 10*3/uL (ref 4.0–10.5)
nRBC: 0 % (ref 0.0–0.2)

## 2020-03-09 LAB — COMPREHENSIVE METABOLIC PANEL
ALT: 37 U/L (ref 0–44)
AST: 20 U/L (ref 15–41)
Albumin: 2.4 g/dL — ABNORMAL LOW (ref 3.5–5.0)
Alkaline Phosphatase: 56 U/L (ref 38–126)
Anion gap: 10 (ref 5–15)
BUN: 16 mg/dL (ref 8–23)
CO2: 24 mmol/L (ref 22–32)
Calcium: 8.4 mg/dL — ABNORMAL LOW (ref 8.9–10.3)
Chloride: 106 mmol/L (ref 98–111)
Creatinine, Ser: 1.01 mg/dL (ref 0.61–1.24)
GFR, Estimated: >60 mL/min (ref >60)
Glucose, Bld: 105 mg/dL — ABNORMAL HIGH (ref 70–99)
Potassium: 3.4 mmol/L — ABNORMAL LOW (ref 3.5–5.1)
Sodium: 140 mmol/L (ref 135–145)
Total Bilirubin: 0.5 mg/dL (ref 0.3–1.2)
Total Protein: 5.1 g/dL — ABNORMAL LOW (ref 6.5–8.1)

## 2020-03-09 LAB — GLUCOSE, CAPILLARY
Glucose-Capillary: 93 mg/dL (ref 70–99)
Glucose-Capillary: 96 mg/dL (ref 70–99)
Glucose-Capillary: 98 mg/dL (ref 70–99)
Glucose-Capillary: 23 mg/dL — CL (ref 70–99)
Glucose-Capillary: 156 mg/dL — ABNORMAL HIGH (ref 70–99)

## 2020-03-09 LAB — CBC
HCT: 28.5 % — ABNORMAL LOW (ref 39.0–52.0)
Hemoglobin: 9.5 g/dL — ABNORMAL LOW (ref 13.0–17.0)
MCH: 29.1 pg (ref 26.0–34.0)
MCHC: 33.3 g/dL (ref 30.0–36.0)
MCV: 87.4 fL (ref 80.0–100.0)
Platelets: 249 10*3/uL (ref 150–400)
RBC: 3.26 MIL/uL — ABNORMAL LOW (ref 4.22–5.81)
RDW: 17 % — ABNORMAL HIGH (ref 11.5–15.5)
WBC: 6.5 10*3/uL (ref 4.0–10.5)
nRBC: 0 % (ref 0.0–0.2)

## 2020-03-09 LAB — VITAMIN B12: Vitamin B-12: 429 pg/mL (ref 180–914)

## 2020-03-09 MED ORDER — POTASSIUM CHLORIDE 10 MEQ/100ML IV SOLN
10.0000 meq | INTRAVENOUS | Status: AC
  Administered 2020-03-09 (×3): 10 meq via INTRAVENOUS
  Filled 2020-03-09 (×2): qty 100

## 2020-03-09 MED ORDER — POTASSIUM CHLORIDE 10 MEQ/100ML IV SOLN
INTRAVENOUS | Status: AC
  Administered 2020-03-09: 10 meq
  Filled 2020-03-09: qty 100

## 2020-03-09 MED ORDER — POTASSIUM CHLORIDE 10 MEQ/100ML IV SOLN
INTRAVENOUS | Status: AC
  Filled 2020-03-09: qty 100

## 2020-03-09 MED ORDER — DEXTROSE 50 % IV SOLN
1.0000 | Freq: Once | INTRAVENOUS | Status: AC
  Administered 2020-03-09: 50 mL via INTRAVENOUS

## 2020-03-09 MED ORDER — DEXTROSE 50 % IV SOLN
INTRAVENOUS | Status: AC
  Filled 2020-03-09: qty 50

## 2020-03-09 NOTE — [place] FL2 (Signed)
[place] MEDICAID FL2 LEVEL OF CARE SCREENING TOOL     IDENTIFICATION  Patient Name: Leonard Forbes Birthdate: October 12, 1955 Sex: male Admission Date (Current Location): 2020/03/03  [facility] and [place] Number:  Producer, television/film/video and Address:  [facility], [address] [place], [place] [address]      Provider Number: [id number]  Attending Physician Name and Address:  Reymundo Poll, MD  Relative Name and Phone Number:  Beaulah Dinning, [phone]    Current Level of Care: Hospital Recommended Level of Care: Skilled Nursing Facility Prior Approval Number:    Date Approved/Denied:   PASRR Number: [id number]  Discharge Plan: SNF    Current Diagnoses: Patient Active Problem List   Diagnosis Date Noted  . Alcohol abuse   . Encephalopathy   . Protein-calorie malnutrition, severe 02/28/2020  . Mandibular fracture (HCC) Mar 03, 2020    Orientation RESPIRATION BLADDER Height & Weight      (Pt does not respond to orientation questions.)  Normal External catheter Weight: 119 lb 4.3 oz (54.1 kg) Height:  5\' 5"  (165.1 cm)  BEHAVIORAL SYMPTOMS/MOOD NEUROLOGICAL BOWEL NUTRITION STATUS      Incontinent Diet (See discharge summary)  AMBULATORY STATUS COMMUNICATION OF NEEDS Skin   Total Care   Surgical wounds (Surgical incisions Left face/lip)                       Personal Care Assistance Level of Assistance  Bathing, Feeding, Dressing Bathing Assistance: Maximum assistance Feeding assistance: Maximum assistance Dressing Assistance: Maximum assistance     Functional Limitations Info  Sight, Hearing, Speech Sight Info:  (Unable to Assess) Hearing Info:  (Unable to Assess) Speech Info:  (Unable to assess)    SPECIAL CARE FACTORS FREQUENCY  PT (By licensed PT), OT (By licensed OT)     PT Frequency: 5x week OT Frequency: 5x week            Contractures Contractures Info: Not present    Additional Factors Info   Code Status, Allergies Code Status Info: Full Allergies Info: NKA           Current Medications (03/09/2020):  This is the current hospital active medication list Current Facility-Administered Medications  Medication Dose Route Frequency Provider Last Rate Last Admin  . 0.9 %  sodium chloride infusion   Intravenous PRN 14/06/2019, MD 10 mL/hr at 03/04/20 1748 Rate Change at 03/04/20 1748  . acetaminophen (TYLENOL) tablet 650 mg  650 mg Per Tube Q6H PRN 03/06/20, MD       Or  . acetaminophen (TYLENOL) suppository 650 mg  650 mg Rectal Q6H PRN Verdene Lennert, MD      . chlorhexidine (PERIDEX) 0.12 % solution 15 mL  15 mL Mouth Rinse BID Verdene Lennert, MD   15 mL at 03/08/20 2321  . Chlorhexidine Gluconate Cloth 2 % PADS 6 each  6 each Topical Daily 14/02/21, MD   6 each at 03/08/20 1018  . dextrose 5 % in lactated ringers infusion   Intravenous Continuous 14/02/21, MD 100 mL/hr at 03/09/20 1020 New Bag at 03/09/20 1020  . feeding supplement (ENSURE ENLIVE / ENSURE PLUS) liquid 237 mL  237 mL Oral TID BM 14/03/21, MD   237 mL at 03/09/20 1040  . folic acid (FOLVITE) tablet 1 mg  1 mg Per Tube Daily 14/03/21, MD   1 mg at 03/06/20 1017  . HYDROmorphone (DILAUDID) injection  0.5 mg  0.5 mg Intravenous Q4H PRN Evlyn Kanner, MD      . MEDLINE mouth rinse  15 mL Mouth Rinse q12n4p Vida Rigger, MD   15 mL at 03/06/20 1205  . multivitamin with minerals tablet 1 tablet  1 tablet Per Tube Daily Vida Rigger, MD   1 tablet at 03/06/20 1016  . ondansetron (ZOFRAN) tablet 4 mg  4 mg Per Tube Q6H PRN Jeanella Craze, Dwayne A, RPH       Or  . ondansetron (ZOFRAN) injection 4 mg  4 mg Intravenous Q6H PRN Lodema Hong A, RPH      . pantoprazole (PROTONIX) injection 40 mg  40 mg Intravenous Q12H Vida Rigger, MD   40 mg at 03/09/20 1022  . senna-docusate (Senokot-S) tablet 2 tablet  2 tablet Per Tube Minerva Ends, MD   2 tablet at 03/08/20 2225  . sodium chloride flush (NS)  0.9 % injection 3 mL  3 mL Intravenous Q12H Vida Rigger, MD   3 mL at 03/07/20 0956  . thiamine 500mg  in normal saline (15ml) IVPB  500 mg Intravenous TID 45m, MD 100 mL/hr at 03/09/20 1052 500 mg at 03/09/20 1052     Discharge Medications: Please see discharge summary for a list of discharge medications.  Relevant Imaging Results:  Relevant Lab Results:   Additional Information SS# 14/03/21  [id number], LCSWA
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
## 2020-03-09 NOTE — Procedures (Signed)
Patient Name: Leonard Forbes  MRN: [id number]  Epilepsy Attending: Charlsie Quest  Referring Physician/Provider: Dr Glenford Bayley Date: 03/09/2020 Duration: 28.23 mins  Patient history: 64yo M POD 13 from ORIF for L mandibular fracture. 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 EEG to evaluate for seizure  Level of alertness: Awake  AEDs during EEG study: None  Technical aspects: This EEG study was done with scalp electrodes positioned according to the 10-20 International system of electrode placement. Electrical activity was acquired at a sampling rate of 500Hz  and reviewed with a high frequency filter of 70Hz  and a low frequency filter of 1Hz . EEG data were recorded continuously and digitally stored.   Description: The posterior dominant rhythm consists of 8 Hz activity of moderate voltage (25-35 uV) seen predominantly in posterior head regions, symmetric and reactive to eye opening and eye closing. EEG showed continuous generalized 5 to 6 Hz theta slowing. There is also 3-5hz  sharply contoured theta-delta slowing in right frontal region consistent with breech artifact. Hyperventilation and photic stimulation were not performed.     ABNORMALITY -Continuous slow, generalized  - Breech artifact, right frontal region  IMPRESSION: This study is suggestive of cortical dysfunction in right frontal region consistent with underling craniotomy. There is also mild diffuse encephalopathy, nonspecific etiology.  No seizures or epileptiform discharges were seen throughout the recording.  Leonard Forbes 
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
## 2020-03-09 NOTE — Progress Notes (Signed)
Hypoglycemic Event  CBG: 23  Treatment: IV D50  Symptoms: asymptomatic  Follow-up CBG: Time:2111 CBG Result:156  Possible Reasons for Event: Pt is NPO awaiting liver ultrasound  Comments/MD notified: Austin Miles MD   Vara Guardian Lyn A
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
## 2020-03-09 NOTE — Progress Notes (Signed)
EEG complete - results pending 

## 2020-03-10 LAB — CBC WITH DIFFERENTIAL/PLATELET
Abs Immature Granulocytes: 0.05 10*3/uL (ref 0.00–0.07)
Basophils Absolute: 0.1 10*3/uL (ref 0.0–0.1)
Basophils Relative: 1 %
Eosinophils Absolute: 0.4 10*3/uL (ref 0.0–0.5)
Eosinophils Relative: 4 %
HCT: 26.5 % — ABNORMAL LOW (ref 39.0–52.0)
Hemoglobin: 8.5 g/dL — ABNORMAL LOW (ref 13.0–17.0)
Immature Granulocytes: 1 %
Lymphocytes Relative: 22 %
Lymphs Abs: 2 10*3/uL (ref 0.7–4.0)
MCH: 28.7 pg (ref 26.0–34.0)
MCHC: 32.1 g/dL (ref 30.0–36.0)
MCV: 89.5 fL (ref 80.0–100.0)
Monocytes Absolute: 0.7 10*3/uL (ref 0.1–1.0)
Monocytes Relative: 8 %
Neutro Abs: 5.6 10*3/uL (ref 1.7–7.7)
Neutrophils Relative %: 64 %
Platelets: 462 10*3/uL — ABNORMAL HIGH (ref 150–400)
RBC: 2.96 MIL/uL — ABNORMAL LOW (ref 4.22–5.81)
RDW: 17 % — ABNORMAL HIGH (ref 11.5–15.5)
WBC: 8.8 10*3/uL (ref 4.0–10.5)
nRBC: 0 % (ref 0.0–0.2)

## 2020-03-10 LAB — COMPREHENSIVE METABOLIC PANEL
ALT: 30 U/L (ref 0–44)
AST: 17 U/L (ref 15–41)
Albumin: 2.5 g/dL — ABNORMAL LOW (ref 3.5–5.0)
Alkaline Phosphatase: 65 U/L (ref 38–126)
Anion gap: 12 (ref 5–15)
BUN: 8 mg/dL (ref 8–23)
CO2: 23 mmol/L (ref 22–32)
Calcium: 8.8 mg/dL — ABNORMAL LOW (ref 8.9–10.3)
Chloride: 105 mmol/L (ref 98–111)
Creatinine, Ser: 1.04 mg/dL (ref 0.61–1.24)
GFR, Estimated: >60 mL/min (ref >60)
Glucose, Bld: 116 mg/dL — ABNORMAL HIGH (ref 70–99)
Potassium: 3.8 mmol/L (ref 3.5–5.1)
Sodium: 140 mmol/L (ref 135–145)
Total Bilirubin: 0.6 mg/dL (ref 0.3–1.2)
Total Protein: 5.4 g/dL — ABNORMAL LOW (ref 6.5–8.1)

## 2020-03-10 LAB — GLUCOSE, CAPILLARY
Glucose-Capillary: 105 mg/dL — ABNORMAL HIGH (ref 70–99)
Glucose-Capillary: 119 mg/dL — ABNORMAL HIGH (ref 70–99)
Glucose-Capillary: 69 mg/dL — ABNORMAL LOW (ref 70–99)
Glucose-Capillary: 82 mg/dL (ref 70–99)
Glucose-Capillary: 85 mg/dL (ref 70–99)
Glucose-Capillary: 88 mg/dL (ref 70–99)
Glucose-Capillary: 91 mg/dL (ref 70–99)

## 2020-03-10 LAB — HEMOGLOBIN A1C
Hgb A1c MFr Bld: 5 % (ref 4.8–5.6)
Mean Plasma Glucose: 96.8 mg/dL

## 2020-03-10 MED ORDER — LACTATED RINGERS IV SOLN: INTRAVENOUS | Status: DC

## 2020-03-10 MED ORDER — DEXTROSE 10 % IV SOLN: INTRAVENOUS | Status: DC

## 2020-03-10 MED ORDER — THIAMINE HCL 100 MG/ML IJ SOLN
500.0000 mg | Freq: Three times a day (TID) | INTRAVENOUS | Status: DC
  Administered 2020-03-10 – 2020-03-15 (×17): 500 mg via INTRAVENOUS
  Filled 2020-03-10 (×22): qty 5

## 2020-03-10 NOTE — Plan of Care (Signed)
  Problem: Nutrition: Goal: Adequate nutrition will be maintained Outcome: Progressing   Problem: Safety: Goal: Ability to remain free from injury will improve Outcome: Progressing   Problem: Skin Integrity: Goal: Risk for impaired skin integrity will decrease Outcome: Progressing   

## 2020-03-10 NOTE — Progress Notes (Addendum)
Pt was able to drink of ensure plus last night. Patient is eating breakfast this morning with assistance from staff. Pt CBG dropped down drastically last night and 1 amp D50 IV was given to patient per MD order. Pt seemed to cooperate with staff and was calm the whole night. Nursing will continue to monitor.
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
## 2020-03-10 NOTE — Plan of Care (Signed)

## 2020-03-10 NOTE — Progress Notes (Signed)
Bed alarn went off. Nurse in room noted patient was standing up at the bedside. Bed at low position and bedrails up. patient loss balance and fell on his left side before nurse could get to him. Assessed patient redness to back of head. Denies of any pain or discomfort. MD notified, no new order noted.No family member to notify. Low bed and floor mats to room.

## 2020-03-11 DIAGNOSIS — F1010 Alcohol abuse, uncomplicated: Secondary | ICD-10-CM | POA: Diagnosis not present

## 2020-03-11 DIAGNOSIS — G9340 Encephalopathy, unspecified: Secondary | ICD-10-CM | POA: Diagnosis not present

## 2020-03-11 DIAGNOSIS — E43 Unspecified severe protein-calorie malnutrition: Secondary | ICD-10-CM | POA: Diagnosis not present

## 2020-03-11 DIAGNOSIS — S02609A Fracture of mandible, unspecified, initial encounter for closed fracture: Secondary | ICD-10-CM | POA: Diagnosis not present

## 2020-03-11 LAB — CBC WITH DIFFERENTIAL/PLATELET
Abs Immature Granulocytes: 0.06 10*3/uL (ref 0.00–0.07)
Basophils Absolute: 0 10*3/uL (ref 0.0–0.1)
Basophils Relative: 0 %
Eosinophils Absolute: 0.4 10*3/uL (ref 0.0–0.5)
Eosinophils Relative: 4 %
HCT: 23.9 % — ABNORMAL LOW (ref 39.0–52.0)
Hemoglobin: 7.6 g/dL — ABNORMAL LOW (ref 13.0–17.0)
Immature Granulocytes: 1 %
Lymphocytes Relative: 19 %
Lymphs Abs: 1.9 10*3/uL (ref 0.7–4.0)
MCH: 28.7 pg (ref 26.0–34.0)
MCHC: 31.8 g/dL (ref 30.0–36.0)
MCV: 90.2 fL (ref 80.0–100.0)
Monocytes Absolute: 0.9 10*3/uL (ref 0.1–1.0)
Monocytes Relative: 9 %
Neutro Abs: 6.7 10*3/uL (ref 1.7–7.7)
Neutrophils Relative %: 67 %
Platelets: 473 10*3/uL — ABNORMAL HIGH (ref 150–400)
RBC: 2.65 MIL/uL — ABNORMAL LOW (ref 4.22–5.81)
RDW: 17 % — ABNORMAL HIGH (ref 11.5–15.5)
WBC: 10 10*3/uL (ref 4.0–10.5)
nRBC: 0 % (ref 0.0–0.2)

## 2020-03-11 LAB — GLUCOSE, CAPILLARY
Glucose-Capillary: 113 mg/dL — ABNORMAL HIGH (ref 70–99)
Glucose-Capillary: 114 mg/dL — ABNORMAL HIGH (ref 70–99)
Glucose-Capillary: 120 mg/dL — ABNORMAL HIGH (ref 70–99)
Glucose-Capillary: 69 mg/dL — ABNORMAL LOW (ref 70–99)
Glucose-Capillary: 88 mg/dL (ref 70–99)
Glucose-Capillary: 90 mg/dL (ref 70–99)
Glucose-Capillary: 96 mg/dL (ref 70–99)
Glucose-Capillary: 96 mg/dL (ref 70–99)

## 2020-03-11 LAB — COMPREHENSIVE METABOLIC PANEL
ALT: 30 U/L (ref 0–44)
AST: 20 U/L (ref 15–41)
Albumin: 2.3 g/dL — ABNORMAL LOW (ref 3.5–5.0)
Alkaline Phosphatase: 57 U/L (ref 38–126)
Anion gap: 8 (ref 5–15)
BUN: 11 mg/dL (ref 8–23)
CO2: 25 mmol/L (ref 22–32)
Calcium: 8.1 mg/dL — ABNORMAL LOW (ref 8.9–10.3)
Chloride: 105 mmol/L (ref 98–111)
Creatinine, Ser: 1.11 mg/dL (ref 0.61–1.24)
GFR, Estimated: >60 mL/min (ref >60)
Glucose, Bld: 102 mg/dL — ABNORMAL HIGH (ref 70–99)
Potassium: 3.8 mmol/L (ref 3.5–5.1)
Sodium: 138 mmol/L (ref 135–145)
Total Bilirubin: 0.4 mg/dL (ref 0.3–1.2)
Total Protein: 5 g/dL — ABNORMAL LOW (ref 6.5–8.1)

## 2020-03-11 MED ORDER — SODIUM CHLORIDE 0.9% FLUSH
10.0000 mL | Freq: Two times a day (BID) | INTRAVENOUS | Status: DC
  Administered 2020-03-11 – 2020-04-25 (×42): 10 mL
  Administered 2020-04-27: 30 mL
  Administered 2020-04-27 – 2020-05-13 (×12): 10 mL
  Administered 2020-05-16: 20 mL

## 2020-03-11 MED ORDER — SODIUM CHLORIDE 0.9% FLUSH
10.0000 mL | INTRAVENOUS | Status: DC | PRN
  Administered 2020-04-16: 10 mL

## 2020-03-11 NOTE — Progress Notes (Signed)
During PIV insertion pt sat up while needle was being inserted.  Prior to insertion, PIV was explained to pt and he did not refuse.  RN was made aware.

## 2020-03-11 NOTE — Progress Notes (Signed)
   Subjective:   Mr. Hoadley is awake and laying in bed this morning. He is not following commands.   Of note, yesterday evening, patient was found to be standing by his bedside by his RN. Before she could make it to him, he fell to the ground and hit his head. His RN denies any abrasions or lesions. Patient apparently stated he is not in the pain at that time.   Objective:  Vital signs in last 24 hours: Vitals:   03/10/20 1527 03/10/20 1935 03/11/20 0300 03/11/20 0745  BP: 130/89 122/86 130/77 117/85  Pulse: 81 80 83 68  Resp:  15 16 18   Temp: 97.7 F (36.5 C) 97.8 F (36.6 C) 98.4 F (36.9 C) 98.5 F (36.9 C)  TempSrc: Oral Axillary Axillary Oral  SpO2: 99% 100% 100%   Weight:      Height:       Physical Exam Vitals and nursing note reviewed.  Constitutional:      Appearance: He is cachectic. He is not ill-appearing.  Pulmonary:     Effort: Pulmonary effort is normal. No respiratory distress.  Neurological:     Mental Status: He is alert.  Psychiatric:        Mood and Affect: Affect is blunt and flat.        Speech: He is noncommunicative.        Behavior: Behavior is uncooperative, slowed and withdrawn.    Assessment/Plan:  Principal Problem:   Mandibular fracture (HCC) Active Problems:   Protein-calorie malnutrition, severe   Encephalopathy   Alcohol abuse  # Persistent Acute Encephalopathy  Patient's behavior and mental status have slowly begun to improve since beginning high-dose thiamine, supporting a possible diagnosis of Wernicke's Encephalopathy. High on the differential is also catatonia from depression. There is also an aspect of cognitive impairment from previous brain injury, and possible delirium.   - Patient may have caseworker per SW; will continue discussions with CM/SW and if not, will likely require emergency guardianship - Continue Thiamine (Day 3/7) - Continue D10LR @ 14mL/hr  - Re-consult Psychiatry for evaluation of catatonia.   - Continue Tylenol q6hr and Dilaudid 0.5mg  q4 hours PRN for pain  - Frequent Neuro Checks - Seizure Precautions  - Continue PT/OT/SLP - Consider Haldol if patient becomes agitated - Continue delirium protocol   # Peptic Ulcer Disease, Stable  Hemoglobin stable today. No complaints of abdominal pain.   - Continue IV Protonix 40mg  twice daily for 1 month then daily thereafter  - Continue morning CBC's   # HTN Blood pressure remains stable without antihypertensives.   - Continue to monitor   # Recurrent Hypoglycemia PO intake improving, so will hold off on Cortrak. Continue fluids for now.   - Will increase CBG monitoring to q2 hours - Continue D10LR @ 155mL/hr   Prior to Admission Living Arrangement: Homeless Anticipated Discharge Location: SNF Barriers to Discharge: Continued medication management; placement  Dispo: Anticipated discharge pending placement and further medical stabilization.   , MD 03/11/2020, 10:15 AM Pager: [phone] After 5pm on weekdays and 1pm on weekends: On Call pager [phone]

## 2020-03-11 NOTE — Plan of Care (Signed)

## 2020-03-11 NOTE — Plan of Care (Signed)
°  Problem: Education: °Goal: Knowledge of General Education information will improve °Description: Including pain rating scale, medication(s)/side effects and non-pharmacologic comfort measures °Outcome: Progressing °  °Problem: Health Behavior/Discharge Planning: °Goal: Ability to manage health-related needs will improve °Outcome: Progressing °  °Problem: Clinical Measurements: °Goal: Ability to maintain clinical measurements within normal limits will improve °Outcome: Progressing °Goal: Will remain free from infection °Outcome: Progressing °Goal: Diagnostic test results will improve °Outcome: Progressing °Goal: Respiratory complications will improve °Outcome: Progressing °Goal: Cardiovascular complication will be avoided °Outcome: Progressing °  °Problem: Safety: °Goal: Ability to remain free from injury will improve °Outcome: Progressing °  °Problem: Skin Integrity: °Goal: Risk for impaired skin integrity will decrease °Outcome: Progressing °  °

## 2020-03-12 DIAGNOSIS — F1010 Alcohol abuse, uncomplicated: Secondary | ICD-10-CM | POA: Diagnosis not present

## 2020-03-12 DIAGNOSIS — G9340 Encephalopathy, unspecified: Secondary | ICD-10-CM | POA: Diagnosis not present

## 2020-03-12 DIAGNOSIS — E43 Unspecified severe protein-calorie malnutrition: Secondary | ICD-10-CM | POA: Diagnosis not present

## 2020-03-12 DIAGNOSIS — S02609A Fracture of mandible, unspecified, initial encounter for closed fracture: Secondary | ICD-10-CM | POA: Diagnosis not present

## 2020-03-12 LAB — CBC WITH DIFFERENTIAL/PLATELET
Abs Immature Granulocytes: 0.07 10*3/uL (ref 0.00–0.07)
Basophils Absolute: 0 10*3/uL (ref 0.0–0.1)
Basophils Relative: 0 %
Eosinophils Absolute: 0.4 10*3/uL (ref 0.0–0.5)
Eosinophils Relative: 4 %
HCT: 23.3 % — ABNORMAL LOW (ref 39.0–52.0)
Hemoglobin: 7.2 g/dL — ABNORMAL LOW (ref 13.0–17.0)
Immature Granulocytes: 1 %
Lymphocytes Relative: 17 %
Lymphs Abs: 1.8 10*3/uL (ref 0.7–4.0)
MCH: 28.1 pg (ref 26.0–34.0)
MCHC: 30.9 g/dL (ref 30.0–36.0)
MCV: 91 fL (ref 80.0–100.0)
Monocytes Absolute: 0.8 10*3/uL (ref 0.1–1.0)
Monocytes Relative: 8 %
Neutro Abs: 7.2 10*3/uL (ref 1.7–7.7)
Neutrophils Relative %: 70 %
Platelets: 434 10*3/uL — ABNORMAL HIGH (ref 150–400)
RBC: 2.56 MIL/uL — ABNORMAL LOW (ref 4.22–5.81)
RDW: 17 % — ABNORMAL HIGH (ref 11.5–15.5)
WBC: 10.3 10*3/uL (ref 4.0–10.5)
nRBC: 0.2 % (ref 0.0–0.2)

## 2020-03-12 LAB — GLUCOSE, CAPILLARY
Glucose-Capillary: 109 mg/dL — ABNORMAL HIGH (ref 70–99)
Glucose-Capillary: 101 mg/dL — ABNORMAL HIGH (ref 70–99)
Glucose-Capillary: 105 mg/dL — ABNORMAL HIGH (ref 70–99)
Glucose-Capillary: 120 mg/dL — ABNORMAL HIGH (ref 70–99)
Glucose-Capillary: 111 mg/dL — ABNORMAL HIGH (ref 70–99)
Glucose-Capillary: 148 mg/dL — ABNORMAL HIGH (ref 70–99)

## 2020-03-12 LAB — IRON AND TIBC
Iron: 30 ug/dL — ABNORMAL LOW (ref 45–182)
Saturation Ratios: 14 % — ABNORMAL LOW (ref 17.9–39.5)
TIBC: 217 ug/dL — ABNORMAL LOW (ref 250–450)
UIBC: 187 ug/dL

## 2020-03-12 LAB — FERRITIN: Ferritin: 96 ng/mL (ref 24–336)

## 2020-03-12 NOTE — Progress Notes (Signed)
Unable to complete admission assessment due to patient non-verbal

## 2020-03-12 NOTE — Plan of Care (Signed)
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
## 2020-03-12 NOTE — Progress Notes (Signed)
Lab and bedside RN made aware that lab will need to stick patient for labs due to no blood return from midline.  Flushes with ease.

## 2020-03-12 NOTE — Progress Notes (Signed)
   Subjective:   Mr. Normoyle had a non-bloody bowel movement last night and drank his ensure but did not speak at all. Today he initially appears to be sleeping but wakes up when we speak to him. He speaks more than he has on previous examinations and seems to have increase in facial expressions, saying "no" that he is not uncomfortable, does not want assistance, and does not answer when we ask if there is anything we can do for him. Does not open mouth when asked to do so, although nursing report that patient was able to eat his breakfast with assistance and ate 50% of his lunch with assistance and drank 2 Ensure.   Objective:  Vital signs in last 24 hours: Vitals:   03/11/20 2144 03/12/20 0319 03/12/20 0804 03/12/20 1457  BP:  125/88 (!) 141/80 122/87  Pulse:  79 75 75  Resp:  18 17 16   Temp:  98.8 F (37.1 C) 98.5 F (36.9 C) 97.8 F (36.6 C)  TempSrc:  Oral Axillary Axillary  SpO2: 100% 97% 100% 100%  Weight:      Height:       General: Patient appears to be resting comfortably in no acute distress.  Neurological: Patient is awake, alert, and answers "yes and no" with no aphasia on occasional other words.  Musculoskeletal: Severe cachexia. Patient does not move extremities.  Psychiatric. Affect is flat although improved from previous exams. Behavior is withdrawn.   Assessment/Plan:  Principal Problem:   Encephalopathy Active Problems:   Mandibular fracture (HCC)   Protein-calorie malnutrition, severe   Alcohol abuse  # Persistent Acute Encephalopathy  Patient's behavior and mental status have slowly begun to improve since beginning high-dose thiamine, supporting a possible diagnosis of Wernicke's Encephalopathy. High on the differential is also catatonia from depression. There is also an aspect of cognitive impairment from previous brain injury, and possible delirium given waxing and waning mental status.   - Patient may have caseworker per SW; will continue  discussions with CM/SW and if not, will likely require emergency guardianship - Continue Thiamine (Day 4/7) - Continue D10LR @ 1103mL/hr  - Have consulted psychiatry for evaluation of delirium 12/5 - Continue Tylenol q6hr and Dilaudid 0.5mg  q4 hours PRN for pain  - Frequent Neuro Checks - Seizure Precautions  - Continue PT/OT/SLP - Consider Haldol if patient becomes agitated - Continue delirium protocol  - Holding on Ativan until psychiatry evaluation for possible catatonia  # Peptic Ulcer Disease, Stable  Hemoglobin continues to slowly decrease, down to 7.2 today, in setting of recent hematemesis and hematochezia that has been stable for several days. Patient had a non-bloody bowel movement yesterday evening.   - Continue IV Protonix 40mg  twice daily for 1 month then daily thereafter  - Continue morning CBC's  - If Hgb < 7.0 tomorrow, will transfuse   # Hypoglycemia, Resolved Patient has had good PO intake over the past couple of days. CBG's around 1-teen's on D10LR.   - If CBG's remain low on D10LR, patient will require insulin studies to rule out pancreatic dysfunction - Will hold off on Cortrak given PO intake  Prior to Admission Living Arrangement: Homeless Anticipated Discharge Location: SNF Barriers to Discharge: Continued medication management; placement  Dispo: Anticipated discharge pending placement and further medical stabilization.   , MD 03/12/2020, 5:35 PM Pager: [phone] After 5pm on weekdays and 1pm on weekends: On Call pager [phone]

## 2020-03-12 NOTE — Plan of Care (Signed)
Patient is alert, appears to understand staff but does not respond. Drunk ensure without any signs of aspiration. Had a BM, has good urine output. SCDs on for VTE. Difficult IV sticks, IV team placed a midline to Left brachial. D10 and LR infusing. CBG every 2 hours. Problem: Education: Goal: Knowledge of General Education information will improve Description: Including pain rating scale, medication(s)/side effects and non-pharmacologic comfort measures 03/12/2020 0437 by Ruby Cola, RN Outcome: Progressing 03/12/2020 0437 by Ruby Cola, RN Outcome: Progressing   Problem: Health Behavior/Discharge Planning: Goal: Ability to manage health-related needs will improve 03/12/2020 0437 by Ruby Cola, RN Outcome: Progressing 03/12/2020 0437 by Ruby Cola, RN Outcome: Progressing   Problem: Clinical Measurements: Goal: Ability to maintain clinical measurements within normal limits will improve 03/12/2020 0437 by Ruby Cola, RN Outcome: Progressing 03/12/2020 0437 by Ruby Cola, RN Outcome: Progressing Goal: Will remain free from infection 03/12/2020 0437 by Ruby Cola, RN Outcome: Progressing 03/12/2020 0437 by Ruby Cola, RN Outcome: Progressing Goal: Diagnostic test results will improve 03/12/2020 0437 by Ruby Cola, RN Outcome: Progressing 03/12/2020 0437 by Ruby Cola, RN Outcome: Progressing Goal: Respiratory complications will improve 03/12/2020 0437 by Ruby Cola, RN Outcome: Progressing 03/12/2020 0437 by Ruby Cola, RN Outcome: Progressing Goal: Cardiovascular complication will be avoided 03/12/2020 0437 by Ruby Cola, RN Outcome: Progressing 03/12/2020 0437 by Ruby Cola, RN Outcome: Progressing   Problem: Activity: Goal: Risk for activity intolerance will decrease 03/12/2020 0437 by Ruby Cola, RN Outcome: Progressing 03/12/2020 0437 by Ruby Cola,  RN Outcome: Progressing   Problem: Nutrition: Goal: Adequate nutrition will be maintained 03/12/2020 0437 by Ruby Cola, RN Outcome: Progressing 03/12/2020 0437 by Ruby Cola, RN Outcome: Progressing   Problem: Coping: Goal: Level of anxiety will decrease 03/12/2020 0437 by Ruby Cola, RN Outcome: Progressing 03/12/2020 0437 by Ruby Cola, RN Outcome: Progressing   Problem: Elimination: Goal: Will not experience complications related to bowel motility 03/12/2020 0437 by Ruby Cola, RN Outcome: Progressing 03/12/2020 0437 by Ruby Cola, RN Outcome: Progressing Goal: Will not experience complications related to urinary retention 03/12/2020 0437 by Ruby Cola, RN Outcome: Progressing 03/12/2020 0437 by Ruby Cola, RN Outcome: Progressing   Problem: Pain Managment: Goal: General experience of comfort will improve 03/12/2020 0437 by Ruby Cola, RN Outcome: Progressing 03/12/2020 0437 by Ruby Cola, RN Outcome: Progressing   Problem: Safety: Goal: Ability to remain free from injury will improve 03/12/2020 0437 by Ruby Cola, RN Outcome: Progressing 03/12/2020 0437 by Ruby Cola, RN Outcome: Progressing   Problem: Skin Integrity: Goal: Risk for impaired skin integrity will decrease 03/12/2020 0437 by Ruby Cola, RN Outcome: Progressing 03/12/2020 0437 by Ruby Cola, RN Outcome: Progressing

## 2020-03-13 DIAGNOSIS — E43 Unspecified severe protein-calorie malnutrition: Secondary | ICD-10-CM | POA: Diagnosis not present

## 2020-03-13 DIAGNOSIS — F1010 Alcohol abuse, uncomplicated: Secondary | ICD-10-CM | POA: Diagnosis not present

## 2020-03-13 DIAGNOSIS — G9340 Encephalopathy, unspecified: Secondary | ICD-10-CM | POA: Diagnosis not present

## 2020-03-13 DIAGNOSIS — S02609A Fracture of mandible, unspecified, initial encounter for closed fracture: Secondary | ICD-10-CM | POA: Diagnosis not present

## 2020-03-13 DIAGNOSIS — E512 Wernicke's encephalopathy: Secondary | ICD-10-CM | POA: Diagnosis present

## 2020-03-13 LAB — CBC
HCT: 24.7 % — ABNORMAL LOW (ref 39.0–52.0)
Hemoglobin: 8.1 g/dL — ABNORMAL LOW (ref 13.0–17.0)
MCH: 29.5 pg (ref 26.0–34.0)
MCHC: 32.8 g/dL (ref 30.0–36.0)
MCV: 89.8 fL (ref 80.0–100.0)
Platelets: 498 10*3/uL — ABNORMAL HIGH (ref 150–400)
RBC: 2.75 MIL/uL — ABNORMAL LOW (ref 4.22–5.81)
RDW: 17.1 % — ABNORMAL HIGH (ref 11.5–15.5)
WBC: 9.8 10*3/uL (ref 4.0–10.5)
nRBC: 0.2 % (ref 0.0–0.2)

## 2020-03-13 LAB — CULTURE, BLOOD (ROUTINE X 2)
Culture: NO GROWTH
Culture: NO GROWTH
Special Requests: ADEQUATE
Special Requests: ADEQUATE

## 2020-03-13 LAB — GLUCOSE, CAPILLARY
Glucose-Capillary: 103 mg/dL — ABNORMAL HIGH (ref 70–99)
Glucose-Capillary: 104 mg/dL — ABNORMAL HIGH (ref 70–99)
Glucose-Capillary: 95 mg/dL (ref 70–99)
Glucose-Capillary: 102 mg/dL — ABNORMAL HIGH (ref 70–99)
Glucose-Capillary: 112 mg/dL — ABNORMAL HIGH (ref 70–99)
Glucose-Capillary: 96 mg/dL (ref 70–99)
Glucose-Capillary: 84 mg/dL (ref 70–99)
Glucose-Capillary: 103 mg/dL — ABNORMAL HIGH (ref 70–99)

## 2020-03-13 MED ORDER — LORAZEPAM 2 MG/ML IJ SOLN
1.0000 mg | Freq: Once | INTRAMUSCULAR | Status: AC
  Administered 2020-03-13: 1 mg via INTRAVENOUS
  Filled 2020-03-13: qty 1

## 2020-03-13 MED ORDER — SODIUM CHLORIDE 0.9 % IV SOLN
510.0000 mg | Freq: Once | INTRAVENOUS | Status: AC
  Administered 2020-03-13: 510 mg via INTRAVENOUS
  Filled 2020-03-13: qty 17
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
## 2020-03-13 NOTE — Plan of Care (Signed)
  Problem: Education: Goal: Knowledge of General Education information will improve Description: Including pain rating scale, medication(s)/side effects and non-pharmacologic comfort measures Outcome: Progressing   Problem: Health Behavior/Discharge Planning: Goal: Ability to manage health-related needs will improve Outcome: Progressing   Problem: Activity: Goal: Risk for activity intolerance will decrease Outcome: Progressing   Problem: Elimination: Goal: Will not experience complications related to bowel motility Outcome: Progressing   Problem: Pain Managment: Goal: General experience of comfort will improve Outcome: Progressing   Problem: Safety: Goal: Ability to remain free from injury will improve Outcome: Progressing   
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
## 2020-03-13 NOTE — Progress Notes (Signed)
Subjective:   Overnight, nursing staff report Leonard Forbes was eating applesauce with assistance and had Ensure. He had a single non-bloody bowel movement. However, this morning, Leonard Forbes did not communicate and only mumbles yes/no when asked questions. He says "yes" to feeling okay and "no" when asked if he understands why he is in the hospital. He grunts yes that he is able to eat okay and no when asked if in pain or if there is anything we can do for him currently.   Objective:  Vital signs in last 24 hours: Vitals:   03/12/20 1457 03/12/20 1945 03/13/20 0300 03/13/20 0741  BP: 122/87 116/84 121/78 122/76  Pulse: 75 71 66 68  Resp: 16 16 16 17   Temp: 97.8 F (36.6 C) 97.9 F (36.6 C) 97.8 F (36.6 C) 98.5 F (36.9 C)  TempSrc: Axillary Axillary Axillary Axillary  SpO2: 100% 100% 100% 100%  Weight:      Height:       General: Patient is lying in bed, curled up on his side and appears uncomfortable although in no acute distress. Neurological: Patient was initially sleeping but was easily aroused and alert, muttering "yes and no" but otherwise not speaking and not following commands.  Musculoskeletal: Severe cachexia. Patient does not move extremities.  Psychiatric. Affect is flat, behavior is withdrawn.   Assessment/Plan:  Principal Problem:   Encephalopathy Active Problems:   Mandibular fracture (HCC)   Protein-calorie malnutrition, severe   Alcohol abuse  # Persistent Acute Encephalopathy  Patient's behavior and mental status have slowly begun to improve since beginning high-dose thiamine, supporting a possible diagnosis of Wernicke's Encephalopathy. High on the differential is also catatonia from depression. There is also an aspect of cognitive impairment from previous brain injury, and possible delirium given waxing and waning mental status. Psychiatry saw patient 12/6 and agree that presentation may be consistent with catatonia and recommend possible trial on Ativan.     - Patient may have caseworker 14/6 per SW; will continue discussions with CM/SW and if not, will likely require emergency guardianship - Continue Thiamine (Day 5/7) - Patient given 1g Ativan for possible catatonic behavior; Ativan 1g q3hrs if successful in increasing mentation / participation with staff - Continue Tylenol q6hr and Dilaudid 0.5mg  q4 hours PRN for pain  - Frequent Neuro Checks - Seizure Precautions  - Continue PT/OT/SLP - Consider Haldol if patient becomes agitated - Continue delirium protocol   # Peptic Ulcer Disease, Stable  # Iron Deficiency  Hemoglobin down-trended to 7.2 yesterday, likely in setting of recent hematemesis and hematochezia. However, patient has been stable for several days w/ non-bloody bowel movement overnight. Hemoglobin stable at 8.1 today.  - Continue IV Protonix 40mg  twice daily for 1 month then daily thereafter  - Decrease frequency of CBC checks unless bleeding returns  - Transfuse for Hgb < 7.0  # Hypoglycemia, Resolved Patient has had some PO intake over the past couple of days. CBG's around 100 on D10LR without hypoglycemia. - Continue to encourage and assist with PO intake - Considering Cortrak placement in near future if unable to maintain glucose levels off fluids - If repeat hypoglycemia occurs, consider insulin studies to rule out pancreatic dysfunction  Prior to Admission Living Arrangement: Homeless Anticipated Discharge Location: SNF Barriers to Discharge: Continued medication management; placement  Dispo: Anticipated discharge pending placement and further medical stabilization.   7/7, MD 03/13/2020, 1:41 PM Pager: [phone] After 5pm on weekdays and 1pm on weekends: On Call pager  [phone]  

## 2020-03-14 LAB — COMPREHENSIVE METABOLIC PANEL
ALT: 22 U/L (ref 0–44)
AST: 16 U/L (ref 15–41)
Albumin: 2.4 g/dL — ABNORMAL LOW (ref 3.5–5.0)
Alkaline Phosphatase: 84 U/L (ref 38–126)
Anion gap: 11 (ref 5–15)
BUN: 6 mg/dL — ABNORMAL LOW (ref 8–23)
CO2: 25 mmol/L (ref 22–32)
Calcium: 8.5 mg/dL — ABNORMAL LOW (ref 8.9–10.3)
Chloride: 103 mmol/L (ref 98–111)
Creatinine, Ser: 1.03 mg/dL (ref 0.61–1.24)
GFR, Estimated: >60 mL/min (ref >60)
Glucose, Bld: 110 mg/dL — ABNORMAL HIGH (ref 70–99)
Potassium: 3.6 mmol/L (ref 3.5–5.1)
Sodium: 139 mmol/L (ref 135–145)
Total Bilirubin: 0.7 mg/dL (ref 0.3–1.2)
Total Protein: 5.6 g/dL — ABNORMAL LOW (ref 6.5–8.1)

## 2020-03-14 LAB — H. PYLORI ANTIGEN, STOOL: H. Pylori Stool Ag, Eia: NEGATIVE

## 2020-03-14 LAB — GLUCOSE, CAPILLARY
Glucose-Capillary: 96 mg/dL (ref 70–99)
Glucose-Capillary: 101 mg/dL — ABNORMAL HIGH (ref 70–99)
Glucose-Capillary: 100 mg/dL — ABNORMAL HIGH (ref 70–99)
Glucose-Capillary: 93 mg/dL (ref 70–99)
Glucose-Capillary: 89 mg/dL (ref 70–99)
Glucose-Capillary: 120 mg/dL — ABNORMAL HIGH (ref 70–99)
Glucose-Capillary: 105 mg/dL — ABNORMAL HIGH (ref 70–99)

## 2020-03-14 NOTE — Plan of Care (Signed)

## 2020-03-14 NOTE — Progress Notes (Signed)
° °  Subjective:   Nursing staff report that patient ate well overnight, up to 70% of his meals, although did not speak much or participate well with consulted services. He had one bowel movement overnight. He says "Yes" and "No" today, again saying no he is not in pain, is not sure why he is in the hospital, and no there is not anything we can do for him. He does say "Yes" to feeling okay.   Objective:  Vital signs in last 24 hours: Vitals:   03/13/20 1337 03/13/20 1952 03/14/20 0500 03/14/20 0837  BP: 121/75 (!) 143/84 123/73 (!) 155/104  Pulse: 60 78 70 76  Resp: 16 19 18 18   Temp: 98.2 F (36.8 C) 98.8 F (37.1 C) 98.6 F (37 C) 98.2 F (36.8 C)  TempSrc: Axillary  Oral Oral  SpO2: 100% 100%  99%  Weight:      Height:       General: Patient is lying in bed, appears comfortable in no acute distress.  Neurological: Patient was initially sleeping but was easily aroused and alert, muttering "yes and no" but otherwise not speaking much and not following commands.  Musculoskeletal: Severe cachexia. Patient moves his bilateral upper extremities.  Psychiatric. Affect is flat, behavior is withdrawn.   Assessment/Plan:  Principal Problem:   Encephalopathy Active Problems:   Mandibular fracture (HCC)   Protein-calorie malnutrition, severe   Alcohol abuse   Wernicke's encephalopathy  # Persistent Acute Encephalopathy  Patient continues to have decreased responsiveness and participation during examinations with waxing and waning mental status. His encephalopathy is likely multifactorial, due to history of TBI with hypoactive dementia, possible catatonia / underlying depression (although became more sedated s/p Ativan 12/8), possible Wernicke's encephalopathy given some improvement after high dose thiamine, vs. Deconditioning with recurrent hypoglycemia and malnutrition, although patient is eating better now.   - SW/CM/TOC assisting in obtaining emergency guardianship - Continue to try  to reach patient's family members - Continue Thiamine (Day 6) - Continue Tylenol q6hr and Dilaudid 0.5mg  q4 hours PRN for pain  - Frequent Neuro Checks - Seizure Precautions  - Continue PT/OT/SLP - Consider Haldol if patient becomes agitated - Continue delirium protocol  - Will check TSH  # Hypoglycemia, Resolved Patient continues to eat increasing amounts of food and Ensure. Glucose ~ 100 on 86mL D10LR. - Continue to encourage and assist with PO intake - Continue to wean down D10LR as able - If repeat hypoglycemia occurs, consider insulin studies to rule out pancreatic dysfunction and possible Cortrak placement   # PUD, Normocytic Anemia, IDA Patient had hematochezia and hematemesis this admission although blood counts have stabilized. S/p single iron infusion.   - Continue IV Protonix 40mg  twice daily for 1 month then daily thereafter  - Decrease frequency of CBC checks unless bleeding returns  - Transfuse for Hgb < 7.0  Prior to Admission Living Arrangement: Homeless Anticipated Discharge Location: SNF Barriers to Discharge: Continued medication management; placement  Dispo: Anticipated discharge pending placement and further medical stabilization.   71m, MD 03/14/2020, 2:22 PM Pager: [phone] After 5pm on weekdays and 1pm on weekends: On Call pager [phone]

## 2020-03-14 NOTE — Plan of Care (Signed)
  Problem: Clinical Measurements: Goal: Respiratory complications will improve Outcome: Progressing   Problem: Nutrition: Goal: Adequate nutrition will be maintained Outcome: Progressing   Problem: Coping: Goal: Level of anxiety will decrease Outcome: Progressing   Problem: Pain Managment: Goal: General experience of comfort will improve Outcome: Progressing   Problem: Safety: Goal: Ability to remain free from injury will improve Outcome: Progressing   
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
## 2020-03-15 DIAGNOSIS — G9340 Encephalopathy, unspecified: Secondary | ICD-10-CM | POA: Diagnosis not present

## 2020-03-15 DIAGNOSIS — F1010 Alcohol abuse, uncomplicated: Secondary | ICD-10-CM | POA: Diagnosis not present

## 2020-03-15 DIAGNOSIS — S02609A Fracture of mandible, unspecified, initial encounter for closed fracture: Secondary | ICD-10-CM | POA: Diagnosis not present

## 2020-03-15 DIAGNOSIS — F322 Major depressive disorder, single episode, severe without psychotic features: Secondary | ICD-10-CM | POA: Diagnosis present

## 2020-03-15 DIAGNOSIS — E43 Unspecified severe protein-calorie malnutrition: Secondary | ICD-10-CM | POA: Diagnosis not present

## 2020-03-15 LAB — TSH: TSH: 2.374 u[IU]/mL (ref 0.350–4.500)

## 2020-03-15 LAB — GLUCOSE, CAPILLARY
Glucose-Capillary: 108 mg/dL — ABNORMAL HIGH (ref 70–99)
Glucose-Capillary: 101 mg/dL — ABNORMAL HIGH (ref 70–99)
Glucose-Capillary: 85 mg/dL (ref 70–99)
Glucose-Capillary: 68 mg/dL — ABNORMAL LOW (ref 70–99)

## 2020-03-15 MED ORDER — FLUOXETINE HCL 20 MG PO CAPS
20.0000 mg | ORAL_CAPSULE | Freq: Every day | ORAL | Status: DC
  Administered 2020-03-15 – 2020-04-02 (×16): 20 mg via ORAL
  Filled 2020-03-15 (×16): qty 1

## 2020-03-15 MED ORDER — PANTOPRAZOLE SODIUM 40 MG PO TBEC: 40.0000 mg | DELAYED_RELEASE_TABLET | Freq: Two times a day (BID) | ORAL | Status: DC

## 2020-03-15 MED ORDER — PANTOPRAZOLE SODIUM 40 MG PO PACK
40.0000 mg | PACK | Freq: Two times a day (BID) | ORAL | Status: DC
  Administered 2020-03-15 – 2020-03-20 (×9): 40 mg via ORAL
  Filled 2020-03-15 (×9): qty 20

## 2020-03-15 MED ORDER — MIRTAZAPINE 15 MG PO TABS
7.5000 mg | ORAL_TABLET | Freq: Every day | ORAL | Status: DC
  Administered 2020-03-15: 7.5 mg via ORAL
  Filled 2020-03-15: qty 1

## 2020-03-15 MED ORDER — FLUOXETINE HCL 20 MG PO CAPS: 20.0000 mg | ORAL_CAPSULE | Freq: Every day | ORAL | Status: DC

## 2020-03-15 NOTE — Plan of Care (Signed)
  Problem: Clinical Measurements: Goal: Respiratory complications will improve Outcome: Progressing   Problem: Nutrition: Goal: Adequate nutrition will be maintained Outcome: Progressing   Problem: Coping: Goal: Level of anxiety will decrease Outcome: Progressing   Problem: Elimination: Goal: Will not experience complications related to urinary retention Outcome: Progressing   Problem: Pain Managment: Goal: General experience of comfort will improve Outcome: Progressing   Problem: Safety: Goal: Ability to remain free from injury will improve Outcome: Progressing   

## 2020-03-15 NOTE — Progress Notes (Addendum)
   Subjective:   NAEO. Blood sugars have continued to remain stable as we have decreased the D10 infusion. He seems more awake today, but he indicates he doesn't feel any different. Denies any pain or acute concerns. Only moves his head yes/no to answer questions.   Objective:  Vital signs in last 24 hours: Vitals:   03/14/20 2000 03/15/20 0300 03/15/20 0422 03/15/20 0831  BP: (!) 133/91 (!) 144/97  (!) 153/101  Pulse: 68 66  67  Resp: 16 15  18   Temp: 98.2 F (36.8 C) 97.8 F (36.6 C)  98.6 F (37 C)  TempSrc: Axillary Axillary  Oral  SpO2: 100% 100%  100%  Weight:   54.3 kg   Height:       General: Patient is lying in bed, appears comfortable in no acute distress.  Neurological: Patient was initially sleeping but was easily aroused and alert, muttering "yes and no" but otherwise not speaking much and not following commands.  Musculoskeletal: Severe cachexia. Patient moves his bilateral upper extremities.  Psychiatric. Affect is flat, behavior is withdrawn.   Assessment/Plan:  Principal Problem:   Encephalopathy Active Problems:   Mandibular fracture (HCC)   Protein-calorie malnutrition, severe   Alcohol abuse   Wernicke's encephalopathy  # Persistent Acute Encephalopathy  Patient continues to have decreased responsiveness and participation during examinations with waxing and waning mental status, although eating most of his meals now. His encephalopathy is likely multifactorial, due to history of TBI with hypoactive dementia, possible catatonia / underlying depression (although became more sedated s/p Ativan 12/8), possible Wernicke's encephalopathy given some improvement after high dose thiamine, vs. Deconditioning with recurrent hypoglycemia and malnutrition, although patient is eating better now. TSH normal.   - SW/CM/TOC assisting in obtaining emergency guardianship - Continue to try to reach patient's family members - Continue Thiamine (Day 7/7)  - Start fluoxetine  20mg  daily - Start mirtazapine 7.5mg  nightly, titrating as needed  - Continue Tylenol q6hr and Dilaudid 0.5mg  q4 hours PRN for pain  - Frequent Neuro Checks - Seizure Precautions  - Continue PT/OT/SLP - Consider Haldol if patient becomes agitated - Continue delirium protocol   # Hypoglycemia, Resolved # Hypertension Patient continues to eat increasing amounts of food and Ensure. Glucose remains stable as D10LR is titrated down. Decreased D10LR from 50 to 30mL/hr.  - Continue to encourage and assist with PO intake - Continue to wean down D10LR as able - Will discontinue LR given hypertension, drinking well. - If repeat hypoglycemia occurs, consider insulin studies to rule out pancreatic dysfunction and possible Cortrak placement   # PUD, Normocytic Anemia, IDA Patient had hematochezia and hematemesis this admission although blood counts have stabilized. S/p single iron infusion.   - Continue IV Protonix 40mg  twice daily for 1 month then daily thereafter  - Decrease frequency of CBC checks unless bleeding returns  - Transfuse for Hgb < 7.0  Prior to Admission Living Arrangement: Homeless Anticipated Discharge Location: SNF Barriers to Discharge: Continued medication management; placement  Dispo: Anticipated discharge pending placement and further medical stabilization.   , MD 03/15/2020, 2:17 PM Pager: [phone] After 5pm on weekdays and 1pm on weekends: On Call pager [phone]
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
## 2020-03-16 DIAGNOSIS — F1010 Alcohol abuse, uncomplicated: Secondary | ICD-10-CM | POA: Diagnosis not present

## 2020-03-16 DIAGNOSIS — S02609A Fracture of mandible, unspecified, initial encounter for closed fracture: Secondary | ICD-10-CM | POA: Diagnosis not present

## 2020-03-16 DIAGNOSIS — G9340 Encephalopathy, unspecified: Secondary | ICD-10-CM | POA: Diagnosis not present

## 2020-03-16 DIAGNOSIS — E43 Unspecified severe protein-calorie malnutrition: Secondary | ICD-10-CM | POA: Diagnosis not present

## 2020-03-16 LAB — GLUCOSE, CAPILLARY
Glucose-Capillary: 90 mg/dL (ref 70–99)
Glucose-Capillary: 89 mg/dL (ref 70–99)
Glucose-Capillary: 96 mg/dL (ref 70–99)
Glucose-Capillary: 98 mg/dL (ref 70–99)
Glucose-Capillary: 98 mg/dL (ref 70–99)

## 2020-03-16 MED ORDER — THIAMINE HCL 100 MG PO TABS
100.0000 mg | ORAL_TABLET | Freq: Every day | ORAL | Status: DC
  Administered 2020-03-17 – 2020-04-29 (×32): 100 mg via ORAL
  Filled 2020-03-16 (×41): qty 1

## 2020-03-16 MED ORDER — MIRTAZAPINE 15 MG PO TABS
7.5000 mg | ORAL_TABLET | Freq: Every day | ORAL | Status: DC
  Administered 2020-03-16: 7.5 mg via ORAL
  Filled 2020-03-16: qty 1

## 2020-03-16 NOTE — Progress Notes (Signed)
° °  Subjective:   Leonard Forbes. Mr. Aldana answers yes to feeling better but when asked if he knows what made him better, he states "not off hand." Unsure why he is in the hospital. He denies pain, nausea, denies anyone we can reach out to.   Objective:  Vital signs in last 24 hours: Vitals:   03/15/20 0422 03/15/20 0831 03/15/20 2100 03/16/20 0405  BP:  (!) 153/101 129/87 (!) 132/91  Pulse:  67 78 74  Resp:  18 17 15   Temp:  98.6 F (37 C) 98.9 F (37.2 C) 97.8 F (36.6 C)  TempSrc:  Oral Axillary Axillary  SpO2:  100%    Weight: 54.3 kg     Height:       General: Patient is lying in bed, appears comfortable in no acute distress.  Neurological: Patient was initially sleeping but was easily aroused and alert, speaking more than on previous exams, although not oriented to place or time.  Musculoskeletal: Severe cachexia. Patient moves his bilateral upper extremities.  Psychiatric. Affect is flat, behavior is withdrawn.   Assessment/Plan:  Principal Problem:   Encephalopathy Active Problems:   Mandibular fracture (HCC)   Protein-calorie malnutrition, severe   Alcohol abuse   Wernicke's encephalopathy   Current severe episode of major depressive disorder without psychotic features (HCC)  # Persistent Acute Encephalopathy  Patient has had improved mental status, speaking more than on previous days since yesterday. Continues to have some waxing and waning mental status, although eating most of his meals now. His encephalopathy is likely multifactorial, due to history of TBI with hypoactive dementia, possible catatonia / underlying depression (although patient denies depressed mood), possible Wernicke's encephalopathy given some improvement after high dose thiamine, vs. Deconditioning.   - SW/CM/TOC assisting in obtaining emergency guardianship - Still unable to contact patient's family - Discontinued IV Thiamine - Continue oral thiamine daily - Continue fluoxetine 20mg  daily - Continue  Mirtazapine 7.5mg  nightly - Frequent Neuro Checks - Seizure Precautions  - Continue PT/OT/SLP - Consider Haldol if patient becomes agitated - Continue delirium protocol   # Hypoglycemia, Improved # Hypertension Patient eating at least half of meals. Blood glucose remains >60 on D10LR 54mL/hr, off 51mL/hr LR. - Continue to encourage and assist with PO intake - Consider taking off D10LR if sugars stable overnight. - Monitor I&O - If repeat hypoglycemia occurs, consider insulin studies to rule out pancreatic dysfunction and possible Cortrak placement   Prior to Admission Living Arrangement: Homeless Anticipated Discharge Location: SNF Barriers to Discharge: Emergency Guardianship, SNF placement  Dispo: Anticipated discharge pending placement    31m, MD 03/16/2020, 7:46 AM Pager: [phone] After 5pm on weekdays and 1pm on weekends: On Call pager [phone]
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
## 2020-03-16 NOTE — Plan of Care (Signed)
  Problem: Activity: Goal: Risk for activity intolerance will decrease 03/16/2020 1848 by Bland Span, RN Outcome: Progressing 03/16/2020 1840 by Bland Span, RN Outcome: Progressing   Problem: Safety: Goal: Ability to remain free from injury will improve 03/16/2020 1848 by Bland Span, RN Outcome: Progressing 03/16/2020 1840 by Bland Span, RN Outcome: Progressing

## 2020-03-16 NOTE — Plan of Care (Deleted)
  Problem: Safety: Goal: Ability to remain free from injury will improve Outcome: Progressing   

## 2020-03-16 NOTE — Plan of Care (Signed)
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
## 2020-03-16 NOTE — Progress Notes (Signed)
Multiple attempts made to administer medication, pt refused. Dr. Antony Contras notified. New orders to d/c Mirtazapine.
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
## 2020-03-17 DIAGNOSIS — F332 Major depressive disorder, recurrent severe without psychotic features: Secondary | ICD-10-CM | POA: Diagnosis not present

## 2020-03-17 DIAGNOSIS — G9340 Encephalopathy, unspecified: Secondary | ICD-10-CM | POA: Diagnosis not present

## 2020-03-17 DIAGNOSIS — S02609A Fracture of mandible, unspecified, initial encounter for closed fracture: Secondary | ICD-10-CM | POA: Diagnosis not present

## 2020-03-17 LAB — CBC WITH DIFFERENTIAL/PLATELET
Abs Immature Granulocytes: 0.03 10*3/uL (ref 0.00–0.07)
Basophils Absolute: 0 10*3/uL (ref 0.0–0.1)
Basophils Relative: 1 %
Eosinophils Absolute: 0.4 10*3/uL (ref 0.0–0.5)
Eosinophils Relative: 5 %
HCT: 29.5 % — ABNORMAL LOW (ref 39.0–52.0)
Hemoglobin: 9 g/dL — ABNORMAL LOW (ref 13.0–17.0)
Immature Granulocytes: 0 %
Lymphocytes Relative: 20 %
Lymphs Abs: 1.7 10*3/uL (ref 0.7–4.0)
MCH: 27.8 pg (ref 26.0–34.0)
MCHC: 30.5 g/dL (ref 30.0–36.0)
MCV: 91 fL (ref 80.0–100.0)
Monocytes Absolute: 0.7 10*3/uL (ref 0.1–1.0)
Monocytes Relative: 9 %
Neutro Abs: 5.5 10*3/uL (ref 1.7–7.7)
Neutrophils Relative %: 65 %
Platelets: 531 10*3/uL — ABNORMAL HIGH (ref 150–400)
RBC: 3.24 MIL/uL — ABNORMAL LOW (ref 4.22–5.81)
RDW: 18 % — ABNORMAL HIGH (ref 11.5–15.5)
WBC: 8.4 10*3/uL (ref 4.0–10.5)
nRBC: 0 % (ref 0.0–0.2)

## 2020-03-17 LAB — MAGNESIUM: Magnesium: 2.1 mg/dL (ref 1.7–2.4)

## 2020-03-17 LAB — GLUCOSE, CAPILLARY
Glucose-Capillary: 88 mg/dL (ref 70–99)
Glucose-Capillary: 91 mg/dL (ref 70–99)
Glucose-Capillary: 88 mg/dL (ref 70–99)
Glucose-Capillary: 116 mg/dL — ABNORMAL HIGH (ref 70–99)

## 2020-03-17 LAB — BASIC METABOLIC PANEL
Anion gap: 8 (ref 5–15)
BUN: 18 mg/dL (ref 8–23)
CO2: 25 mmol/L (ref 22–32)
Calcium: 8.6 mg/dL — ABNORMAL LOW (ref 8.9–10.3)
Chloride: 106 mmol/L (ref 98–111)
Creatinine, Ser: 1.08 mg/dL (ref 0.61–1.24)
GFR, Estimated: >60 mL/min (ref >60)
Glucose, Bld: 94 mg/dL (ref 70–99)
Potassium: 4.2 mmol/L (ref 3.5–5.1)
Sodium: 139 mmol/L (ref 135–145)

## 2020-03-17 LAB — PHOSPHORUS: Phosphorus: 3.8 mg/dL (ref 2.5–4.6)

## 2020-03-17 NOTE — Plan of Care (Signed)

## 2020-03-17 NOTE — Progress Notes (Addendum)
   Subjective:   No acute overnight events. Patient seen this AM on bedside rounds. He is quiet this morning and only replies no when asking if he is in any discomfort. Otherwise, Mr. Amirault did not participate in interview.    Objective:  Vital signs in last 24 hours: Vitals:   03/16/20 1429 03/16/20 2018 03/17/20 0450 03/17/20 0700  BP: (!) 131/93 120/84 (!) 124/91 123/89  Pulse: 73 75 67 65  Resp: 18 15 16 15   Temp: 97.9 F (36.6 C) 100 F (37.8 C) 99.2 F (37.3 C) 99.1 F (37.3 C)  TempSrc: Oral Axillary Axillary Axillary  SpO2: 99% 100%  100%  Weight:      Height:       Physical Exam Vitals and nursing note reviewed.  Constitutional:      Appearance: He is cachectic.     Comments: Chronically ill appearing  HENT:     Head: Normocephalic and atraumatic.  Neurological:     Mental Status: He is lethargic.  Psychiatric:        Behavior: Behavior is uncooperative.    Assessment/Plan:  Principal Problem:   Encephalopathy Active Problems:   Mandibular fracture (HCC)   Protein-calorie malnutrition, severe   Alcohol abuse   Wernicke's encephalopathy   Current severe episode of major depressive disorder without psychotic features (HCC)  # Acute on Chronic Encephalopathy  Waxing and waning persistent encephalopathy. Multifactorial, including history of TBI, ? hypoactive delirium, ? underlying depression and Wernicke's encephalopathy. Completed 7 days course of high dose IV thiamine and on oral repletion now.   - SW/CM/TOC assisting in obtaining emergency guardianship - Continue oral thiamine daily - Continue fluoxetine 20mg  daily - Discontinue Remeron due to increased lethargy - Frequent Neuro Checks - Seizure Precautions  - Continue PT/OT/SLP - Consider Haldol if patient becomes agitated - Continue delirium protocol   # Hypoglycemia In the setting of chronic illness, very poor nutritional status, chronic AUD with new refusal to eat. This has improved over the  past several days. Now eating and patient's glucose as normalized. D10 still running at 25 cc/hr but will discontinue now.   - Continue to encourage and assist with PO intake - Monitor I&O - CBG q4h - If repeat hypoglycemia (CBG < 55) occurs, stat BMP to confirm and D50 amp PRN  # Hx of Hypertension Not on anti-hypertensives currently and BP has been within goal. Will continue to monitor.    Prior to Admission Living Arrangement: Homeless Anticipated Discharge Location: SNF Barriers to Discharge: Emergency Guardianship, SNF placement   Dispo: Anticipated discharge pending placement    Dr. Internal Medicine PGY-2  Pager: [phone] After 5pm on weekdays and 1pm on weekends: On Call pager [phone]  03/17/2020, 11:27 AM

## 2020-03-17 NOTE — Progress Notes (Signed)
  Date: 03/17/2020  Patient name: Leonard Forbes  Medical record number: [id number]  Date of birth: [birth year]        I have seen and evaluated this patient and I have discussed the plan of care with the house staff. Please see their note for complete details. I concur with Dr Renaee Munda findings.  Gust Rung, DO 03/17/2020, 12:02 PM

## 2020-03-18 ENCOUNTER — Encounter (HOSPITAL_COMMUNITY): Payer: Self-pay | Admitting: Internal Medicine

## 2020-03-18 LAB — GLUCOSE, CAPILLARY
Glucose-Capillary: 118 mg/dL — ABNORMAL HIGH (ref 70–99)
Glucose-Capillary: 88 mg/dL (ref 70–99)
Glucose-Capillary: 94 mg/dL (ref 70–99)
Glucose-Capillary: 95 mg/dL (ref 70–99)
Glucose-Capillary: 98 mg/dL (ref 70–99)

## 2020-03-18 NOTE — Plan of Care (Signed)
  Problem: Education: Goal: Knowledge of General Education information will improve Description: Including pain rating scale, medication(s)/side effects and non-pharmacologic comfort measures Outcome: Progressing   Problem: Health Behavior/Discharge Planning: Goal: Ability to manage health-related needs will improve Outcome: Progressing   Problem: Pain Managment: Goal: General experience of comfort will improve Outcome: Progressing   

## 2020-03-18 NOTE — Progress Notes (Signed)
   Subjective:   No acute overnight events. Patient seen this AM on bedside rounds. He ate almost 100% of his dinner and another meal around 3am this morning. No bowel movements overnight. Denies pain, denies anyone we could call to discuss care with, denies desire to get out of bed. No answer when questioned if mood is depressed.   Objective:  Vital signs in last 24 hours: Vitals:   03/17/20 0700 03/17/20 1300 03/17/20 1942 03/18/20 0359  BP: 123/89 120/88 131/85 133/83  Pulse: 65 67 77 69  Resp: 15 14 16 16   Temp: 99.1 F (37.3 C) 99 F (37.2 C) 98.8 F (37.1 C) 98.9 F (37.2 C)  TempSrc: Axillary Axillary    SpO2: 100% 99% 100% 100%  Weight:      Height:       General: Resting comfortably in no acute distress.  Abdominal: Abdomen is slightly firm although without tenderness to palpation. Neurological: Alert. Answers "yes" "no".  Musculoskeletal: Able to wiggle toes bilaterally.  Psych: Flat affect.   Assessment/Plan:  Principal Problem:   Encephalopathy Active Problems:   Mandibular fracture (HCC)   Protein-calorie malnutrition, severe   Alcohol abuse   Wernicke's encephalopathy   Current severe episode of major depressive disorder without psychotic features (HCC)  # Acute on Chronic Encephalopathy  Waxing and waning persistent encephalopathy. Multifactorial, including history of TBI, ? hypoactive delirium, ? underlying depression and Wernicke's encephalopathy. Completed 7 days course of high dose IV thiamine and on oral repletion now.   - SW/CM/TOC assisting in obtaining emergency guardianship - Continue oral thiamine daily - Continue fluoxetine 20mg  daily - Continue to hold Mirtazapine given increased lethargy  - Frequent Neuro Checks - Seizure Precautions  - Continue PT/OT/SLP - Consider Haldol if patient becomes agitated - Continue delirium protocol   # Polycythemia  Platelets elevated to 531. Other cell counts stable. May be due to hemoconcentration given  no history of the same.   - Continue to monitor   # Hypoglycemia, Resolved Patient previously had recurrent hypoglycemia requiring IV supplementation; however, blood sugars have remained stable after D10LR was discontinued yesterday.    - Continue to encourage and assist with PO intake - Monitor I&O - CBG q4h - If repeat hypoglycemia (CBG < 55) occurs, stat BMP to confirm and D50 amp PRN - Consider addition of maintenance fluids if patient does not drink enough PO   # Hx of Hypertension Not on anti-hypertensives currently and BP has been within goal.   - Continue to monitor   Prior to Admission Living Arrangement: Homeless Anticipated Discharge Location: SNF Barriers to Discharge: Emergency Guardianship, SNF placement   Dispo: Anticipated discharge pending placement    Dr. Internal Medicine PGY-1 Pager: [phone] After 5pm on weekdays and 1pm on weekends: On Call pager [phone]  03/18/2020, 11:23 AM

## 2020-03-18 NOTE — Plan of Care (Signed)

## 2020-03-19 DIAGNOSIS — G9340 Encephalopathy, unspecified: Secondary | ICD-10-CM | POA: Diagnosis not present

## 2020-03-19 DIAGNOSIS — E43 Unspecified severe protein-calorie malnutrition: Secondary | ICD-10-CM | POA: Diagnosis not present

## 2020-03-19 DIAGNOSIS — F1010 Alcohol abuse, uncomplicated: Secondary | ICD-10-CM | POA: Diagnosis not present

## 2020-03-19 DIAGNOSIS — S02609A Fracture of mandible, unspecified, initial encounter for closed fracture: Secondary | ICD-10-CM | POA: Diagnosis not present

## 2020-03-19 LAB — GLUCOSE, CAPILLARY
Glucose-Capillary: 95 mg/dL (ref 70–99)
Glucose-Capillary: 84 mg/dL (ref 70–99)
Glucose-Capillary: 111 mg/dL — ABNORMAL HIGH (ref 70–99)
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
## 2020-03-19 NOTE — Progress Notes (Signed)
Patient is unable to participate in CAGE assessment.

## 2020-03-19 NOTE — Plan of Care (Signed)
  Problem: Education: Goal: Knowledge of General Education information will improve Description: Including pain rating scale, medication(s)/side effects and non-pharmacologic comfort measures Outcome: Progressing   Problem: Clinical Measurements: Goal: Ability to maintain clinical measurements within normal limits will improve Outcome: Progressing Goal: Will remain free from infection Outcome: Progressing Goal: Diagnostic test results will improve Outcome: Progressing Goal: Respiratory complications will improve Outcome: Progressing Goal: Cardiovascular complication will be avoided Outcome: Progressing   Problem: Health Behavior/Discharge Planning: Goal: Ability to manage health-related needs will improve Outcome: Progressing   Problem: Coping: Goal: Level of anxiety will decrease Outcome: Progressing
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
## 2020-03-19 NOTE — Progress Notes (Signed)
   Subjective:   No acute overnight events. Patient seen this AM on bedside rounds. Per nursing staff, he did not want to eat his breakfast this morning and has not interacted with them much. States "no" to wanting to get up or when asked if in pain. Unable to state children's names.   Objective:  Vital signs in last 24 hours: Vitals:   03/19/20 0043 03/19/20 0500 03/19/20 0755 03/19/20 1500  BP: 130/76 110/89 108/79 98/67  Pulse: 80 80 70 78  Resp: 17 17 18 17   Temp: 97.7 F (36.5 C) 98.7 F (37.1 C) 97.7 F (36.5 C) 98.2 F (36.8 C)  TempSrc: Axillary Axillary Oral Axillary  SpO2: 100% 97%  98%  Weight:      Height:       General: Resting comfortably in no acute distress.  Cardiology: RRR. No murmurs, rubs, gallops. Neurological: Alert. Answers "no". Tracks with eyes.  Musculoskeletal: Severe cachexia. Does not move extremities during examination. Psych: Flat affect.   Assessment/Plan:  Principal Problem:   Encephalopathy Active Problems:   Mandibular fracture (HCC)   Protein-calorie malnutrition, severe   Alcohol abuse   Wernicke's encephalopathy   Current severe episode of major depressive disorder without psychotic features (HCC)  # Acute on Chronic Encephalopathy  Waxing and waning persistent encephalopathy. Multifactorial, including history of TBI, ? hypoactive delirium, ? underlying depression and Wernicke's encephalopathy. Completed 7 days course of high dose IV thiamine and on oral repletion now in addition to fluoxetine.   - SW/CM/TOC assisting in obtaining emergency guardianship - Continue oral thiamine daily - Continue fluoxetine 20mg  daily - Continue PT/OT/SLP - Continue to encourage PO intake.  - Consider Haldol if patient becomes agitated - Continue delirium protocol   # Hypoglycemia, Resolved CBG's remain stable after D10LR discontinued. However, patient continues to intermittently decline food.   - Continue to encourage and assist with PO  intake - Monitor I&O - CBG q4h - If repeat hypoglycemia (CBG < 55) occurs, stat BMP to confirm and D50 amp PRN  # Hx of Hypertension Not on anti-hypertensives currently and BP has been within goal, on the soft side today. Patient not consuming many fluids by mouth.  - Continue to monitor pressures closely. - May require maintenance fluids if unable to keep up with PO intake   Prior to Admission Living Arrangement: Homeless Anticipated Discharge Location: SNF Barriers to Discharge: Emergency Guardianship, SNF placement   Dispo: Anticipated discharge pending placement    Code Status: Full Code  Dr. Internal Medicine PGY-1 Pager: [phone] After 5pm on weekdays and 1pm on weekends: On Call pager [phone]  03/19/2020, 4:08 PM

## 2020-03-19 NOTE — Progress Notes (Signed)
Patient refuses meals, however took 2 bottles of ensure today.

## 2020-03-20 ENCOUNTER — Encounter (HOSPITAL_COMMUNITY): Payer: Self-pay | Admitting: Internal Medicine

## 2020-03-20 DIAGNOSIS — E43 Unspecified severe protein-calorie malnutrition: Secondary | ICD-10-CM | POA: Diagnosis not present

## 2020-03-20 DIAGNOSIS — G9340 Encephalopathy, unspecified: Secondary | ICD-10-CM | POA: Diagnosis not present

## 2020-03-20 DIAGNOSIS — F1010 Alcohol abuse, uncomplicated: Secondary | ICD-10-CM | POA: Diagnosis not present

## 2020-03-20 DIAGNOSIS — S02609A Fracture of mandible, unspecified, initial encounter for closed fracture: Secondary | ICD-10-CM | POA: Diagnosis not present

## 2020-03-20 LAB — GLUCOSE, CAPILLARY
Glucose-Capillary: 81 mg/dL (ref 70–99)
Glucose-Capillary: 94 mg/dL (ref 70–99)
Glucose-Capillary: 110 mg/dL — ABNORMAL HIGH (ref 70–99)
Glucose-Capillary: 124 mg/dL — ABNORMAL HIGH (ref 70–99)

## 2020-03-20 MED ORDER — ONDANSETRON HCL 4 MG PO TABS: 4.0000 mg | ORAL_TABLET | Freq: Four times a day (QID) | ORAL | Status: DC | PRN

## 2020-03-20 MED ORDER — PANTOPRAZOLE SODIUM 40 MG PO TBEC
40.0000 mg | DELAYED_RELEASE_TABLET | Freq: Two times a day (BID) | ORAL | Status: DC
  Administered 2020-03-20 – 2020-04-29 (×65): 40 mg via ORAL
  Filled 2020-03-20 (×75): qty 1

## 2020-03-20 MED ORDER — ACETAMINOPHEN 325 MG PO TABS
650.0000 mg | ORAL_TABLET | Freq: Four times a day (QID) | ORAL | Status: DC | PRN
  Administered 2020-04-01 – 2020-04-20 (×2): 650 mg via ORAL
  Filled 2020-03-20 (×2): qty 2

## 2020-03-20 MED ORDER — FOLIC ACID 1 MG PO TABS
1.0000 mg | ORAL_TABLET | Freq: Every day | ORAL | Status: DC
  Administered 2020-03-21 – 2020-05-10 (×33): 1 mg via ORAL
  Filled 2020-03-20 (×44): qty 1

## 2020-03-20 MED ORDER — ADULT MULTIVITAMIN W/MINERALS CH
1.0000 | ORAL_TABLET | Freq: Every day | ORAL | Status: DC
  Administered 2020-03-21 – 2020-05-10 (×32): 1 via ORAL
  Filled 2020-03-20 (×43): qty 1

## 2020-03-20 MED ORDER — ACETAMINOPHEN 650 MG RE SUPP
650.0000 mg | Freq: Four times a day (QID) | RECTAL | Status: DC | PRN
  Administered 2020-05-15 – 2020-05-17 (×2): 650 mg via RECTAL
  Filled 2020-03-20 (×2): qty 1

## 2020-03-20 MED ORDER — SENNOSIDES-DOCUSATE SODIUM 8.6-50 MG PO TABS
2.0000 | ORAL_TABLET | Freq: Every day | ORAL | Status: DC
  Administered 2020-03-20 – 2020-05-13 (×47): 2 via ORAL
  Filled 2020-03-20 (×49): qty 2

## 2020-03-20 MED ORDER — ONDANSETRON HCL 4 MG/2ML IJ SOLN: 4.0000 mg | Freq: Four times a day (QID) | INTRAMUSCULAR | Status: DC | PRN
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
## 2020-03-20 NOTE — Progress Notes (Signed)
   Subjective:   Overnight, patient did not eat meals, although did drink 2 ensures. Eating and drinking much better today. He did not communicate during PT session yesterday but did sit up in bed and followed a few verbal commands/cues. Today, he is more conversational than usual. He is able to state he is not sure why he is in the hospital and is unsure of his last memory, although he states he is hungry and would like to sit up in bed to eat his breakfast. Denies any pain or depression. Denies any family members that we could reach out to and seems open to going to a rehabilitation facility.   Objective:  Vital signs in last 24 hours: Vitals:   03/19/20 1500 03/20/20 0430 03/20/20 1050 03/20/20 1407  BP: 98/67 113/86 (!) 134/101 (!) 125/92  Pulse: 78 71 62 77  Resp: 17 17 18 18   Temp: 98.2 F (36.8 C) 98 F (36.7 C) (!) 97.3 F (36.3 C) 97.9 F (36.6 C)  TempSrc: Axillary  Oral   SpO2: 98% 100% 100% 100%  Weight:      Height:       General: Resting comfortably in no acute distress.  Cardiology: RRR. No murmurs, rubs, gallops. Neurological: Alert and able to speak more today, without dysarthria.  Musculoskeletal: Severe cachexia. Patient is able to move all four extremities although with ~4/5 strength in all extremities.  Psych: Flat affect.   Assessment/Plan:  Principal Problem:   Encephalopathy Active Problems:   Mandibular fracture (HCC)   Protein-calorie malnutrition, severe   Alcohol abuse   Wernicke's encephalopathy   Current severe episode of major depressive disorder without psychotic features (HCC)  # Acute on Chronic Encephalopathy  Waxing and waning persistent encephalopathy. Mental status today significantly improved since previous examinations. Multifactorial, including history of TBI, ? hypoactive delirium, ? underlying depression and Wernicke's encephalopathy. Completed 7 days course of high dose IV thiamine and on oral repletion now in addition to  fluoxetine. Denies depression, pain. Eating and drinking well.   - SW/CM/TOC assisting in obtaining emergency guardianship and are hopeful for SNF placement  - Continue oral thiamine daily - Continue fluoxetine 20mg  daily - Continue PT/OT/SLP - Continue to encourage PO intake.  - Consider Haldol if patient becomes agitated - Continue delirium protocol  - Medications switched to PO as patient tolerating intake much better  # Hypoglycemia, Resolved CBG's remain stable after D10LR discontinued. Nursing report patient did not eat much yesterday but is eating and drinking well today.  - Monitor I&O - CBG q4h - If repeat hypoglycemia (CBG < 55) occurs, stat BMP to confirm and D50 amp PRN  # Severe Malnutrition Nutritional management on board.   - Continue Ensure Enlive, Magic cup, and Hormel Shake three times daily with meals - Continue MVI with minerals daily  - Continue feeding assistance if needed  - Follow for possible diet advancement if able to do so  Prior to Admission Living Arrangement: Homeless Anticipated Discharge Location: SNF Barriers to Discharge: Emergency Guardianship, SNF placement   Dispo: Anticipated discharge pending placement    Code Status: Full Code  Dr. Internal Medicine PGY-1 Pager: [phone] After 5pm on weekdays and 1pm on weekends: On Call pager [phone]  03/20/2020, 3:09 PM

## 2020-03-20 NOTE — TOC Progression Note (Addendum)
Transition of Care [facility]) - Progression Note    Patient Details  Name: Llewelyn Sheaffer MRN: [id number] Date of Birth: 18-May-1955  Transition of [facility]) CM/SW Contact  Janae Bridgeman, RN Phone Number: 03/20/2020, 12:27 PM  Clinical Narrative:    Case management reached out to Revonda Standard, CM at [facility] regarding possible placement at one of their SNF facilities in the area.  The CSW is looking into available Medicare days available to the patient and possible LTC admission under the patient's pending Medicaid.  I spoke with [name], MSW this morning and Linde Gillis, MSW should be calling this afternoon to give an update on the status of the patient's emergency guardianship as well.  Case management and TOC social work will continue to explore options for SNF admission for this patient.  03/20/2020-  Case management spoke with Linde Gillis, MSW with [facility]PS on the phone regarding patient's need for guardianship and placement at a skilled nursing facility and inability to reach family members of the patient.  Kyla Balzarine is starting the guardianship process and clinicals were emailed to [email or web address]  [email or web address]  Adriend Kyla Balzarine. MSW [facility] - [phone]  Expected Discharge Plan: Skilled Nursing Facility Barriers to Discharge: Homeless with medical needs  Expected Discharge Plan and Services Expected Discharge Plan: Skilled Nursing Facility   Discharge Planning Services: CM Consult Post Acute Care Choice: Skilled Nursing Facility Living arrangements for the past 2 months: Homeless                                       Social Determinants of Health (SDOH) Interventions    Readmission Risk Interventions Readmission Risk Prevention Plan 02/28/2020  Transportation Screening Complete  PCP or Specialist Appt within 5-7 Days Complete  Home Care Screening Complete  Medication Review (RN CM) Complete
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
## 2020-03-21 ENCOUNTER — Encounter (HOSPITAL_COMMUNITY): Payer: Self-pay | Admitting: Internal Medicine

## 2020-03-21 DIAGNOSIS — E43 Unspecified severe protein-calorie malnutrition: Secondary | ICD-10-CM | POA: Diagnosis not present

## 2020-03-21 DIAGNOSIS — F1010 Alcohol abuse, uncomplicated: Secondary | ICD-10-CM | POA: Diagnosis not present

## 2020-03-21 DIAGNOSIS — S02609A Fracture of mandible, unspecified, initial encounter for closed fracture: Secondary | ICD-10-CM | POA: Diagnosis not present

## 2020-03-21 DIAGNOSIS — G9340 Encephalopathy, unspecified: Secondary | ICD-10-CM | POA: Diagnosis not present

## 2020-03-21 LAB — GLUCOSE, CAPILLARY
Glucose-Capillary: 100 mg/dL — ABNORMAL HIGH (ref 70–99)
Glucose-Capillary: 102 mg/dL — ABNORMAL HIGH (ref 70–99)
Glucose-Capillary: 86 mg/dL (ref 70–99)
Glucose-Capillary: 87 mg/dL (ref 70–99)
Glucose-Capillary: 98 mg/dL (ref 70–99)

## 2020-03-21 NOTE — Progress Notes (Signed)
Pt refused meals (including ensure) and repositioning all day. Was agitated and kicked staff away, did not want to be touched. But he would slightly reposition himself throughout the day.

## 2020-03-21 NOTE — Progress Notes (Signed)
   Subjective:   Overnight, patient again did not eat much. Nursing staff report he became agitated this morning upon attempts to feed him breakfast. He did not speak or nod his head yes/no on interview today.   Objective:  Vital signs in last 24 hours: Vitals:   03/20/20 1050 03/20/20 1407 03/20/20 2031 03/21/20 0504  BP: (!) 134/101 (!) 125/92 123/89 134/81  Pulse: 62 77 89 68  Resp: 18 18 17 18   Temp: (!) 97.3 F (36.3 C) 97.9 F (36.6 C) 98.7 F (37.1 C) 97.8 F (36.6 C)  TempSrc: Oral     SpO2: 100% 100% 98% 100%  Weight:      Height:       General: Resting in uncomfortable appearing position although in no acute distress.  Cardiology: RRR. No murmurs, rubs, gallops. Abdominal: Bowel sounds intact.  Neurological: Alert although patient does not speak.  Musculoskeletal: Severe cachexia. Patient able to move all four distal extremities.  Psych: Flat affect.   Assessment/Plan:  Principal Problem:   Encephalopathy Active Problems:   Mandibular fracture (HCC)   Protein-calorie malnutrition, severe   Alcohol abuse   Wernicke's encephalopathy   Current severe episode of major depressive disorder without psychotic features (HCC)  # Acute on Chronic Encephalopathy  Waxing and waning persistent encephalopathy. Did not participate much on examination this morning. Multifactorial, including history of TBI, ? hypoactive delirium, ? underlying depression and Wernicke's encephalopathy. Completed 7 days course of high dose IV thiamine and on oral repletion now in addition to fluoxetine.   - SW/CM/TOC assisting in obtaining emergency guardianship for [facility] at [facility]  - Continue oral thiamine daily - Continue fluoxetine 20mg  daily - Continue PT/OT/SLP - Will speak with SLP to see if diet can be expanded to hopefully increase PO intake  - Consider Haldol if patient becomes agitated - Continue delirium protocol   # Hypoglycemia, Resolved CBG's remain stable after  D10LR discontinued. Patient not eating or drinking as much in the last couple days.   - Monitor I&O - CBG q4h - If repeat hypoglycemia (CBG < 55) occurs, stat BMP to confirm and D50 amp PRN  # Severe Malnutrition Nutritional management on board.   - Continue Ensure Enlive, Magic cup, and Hormel Shake three times daily with meals - Continue MVI with minerals daily  - Continue feeding assistance if needed  - Follow for possible diet advancement if able to do so  Prior to Admission Living Arrangement: Homeless Anticipated Discharge Location: SNF Barriers to Discharge: Emergency Guardianship  Dispo: Anticipated discharge pending placement    Code Status: Full Code  Dr. [facility] Internal Medicine PGY-1 Pager: [phone] After 5pm on weekdays and 1pm on weekends: On Call pager [phone]  03/21/2020, 1:33 PM

## 2020-03-22 DIAGNOSIS — S02609A Fracture of mandible, unspecified, initial encounter for closed fracture: Secondary | ICD-10-CM | POA: Diagnosis not present

## 2020-03-22 DIAGNOSIS — F1010 Alcohol abuse, uncomplicated: Secondary | ICD-10-CM | POA: Diagnosis not present

## 2020-03-22 DIAGNOSIS — G9340 Encephalopathy, unspecified: Secondary | ICD-10-CM | POA: Diagnosis not present

## 2020-03-22 DIAGNOSIS — E43 Unspecified severe protein-calorie malnutrition: Secondary | ICD-10-CM | POA: Diagnosis not present

## 2020-03-22 LAB — CBC
HCT: 37.1 % — ABNORMAL LOW (ref 39.0–52.0)
Hemoglobin: 11.5 g/dL — ABNORMAL LOW (ref 13.0–17.0)
MCH: 28.3 pg (ref 26.0–34.0)
MCHC: 31 g/dL (ref 30.0–36.0)
MCV: 91.2 fL (ref 80.0–100.0)
Platelets: 574 10*3/uL — ABNORMAL HIGH (ref 150–400)
RBC: 4.07 MIL/uL — ABNORMAL LOW (ref 4.22–5.81)
RDW: 16.7 % — ABNORMAL HIGH (ref 11.5–15.5)
WBC: 5.6 10*3/uL (ref 4.0–10.5)
nRBC: 0 % (ref 0.0–0.2)

## 2020-03-22 LAB — GLUCOSE, CAPILLARY
Glucose-Capillary: 100 mg/dL — ABNORMAL HIGH (ref 70–99)
Glucose-Capillary: 114 mg/dL — ABNORMAL HIGH (ref 70–99)
Glucose-Capillary: 116 mg/dL — ABNORMAL HIGH (ref 70–99)
Glucose-Capillary: 126 mg/dL — ABNORMAL HIGH (ref 70–99)
Glucose-Capillary: 128 mg/dL — ABNORMAL HIGH (ref 70–99)
Glucose-Capillary: 137 mg/dL — ABNORMAL HIGH (ref 70–99)

## 2020-03-22 LAB — BASIC METABOLIC PANEL
Anion gap: 12 (ref 5–15)
BUN: 30 mg/dL — ABNORMAL HIGH (ref 8–23)
CO2: 23 mmol/L (ref 22–32)
Calcium: 9.4 mg/dL (ref 8.9–10.3)
Chloride: 105 mmol/L (ref 98–111)
Creatinine, Ser: 1.21 mg/dL (ref 0.61–1.24)
GFR, Estimated: >60 mL/min (ref >60)
Glucose, Bld: 123 mg/dL — ABNORMAL HIGH (ref 70–99)
Potassium: 3.9 mmol/L (ref 3.5–5.1)
Sodium: 140 mmol/L (ref 135–145)

## 2020-03-22 NOTE — Plan of Care (Signed)
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
## 2020-03-22 NOTE — Progress Notes (Signed)
   Subjective:   Yesterday evening, Mr. Sanluis was noted to have refused all meals including his Ensure and refused repositioning. He was noted to be agitated, not letting staff touch him.   Objective:  Vital signs in last 24 hours: Vitals:   03/21/20 2000 03/22/20 0300 03/22/20 0924 03/22/20 1627  BP: 125/66 (!) 109/51 (!) 133/98 115/87  Pulse: 77 68 78 79  Resp: 15 15 18 18   Temp: 98 F (36.7 C) 98.1 F (36.7 C) 98 F (36.7 C) 98.5 F (36.9 C)  TempSrc: Axillary Axillary Oral Oral  SpO2: 100% 98% 100% 100%  Weight:      Height:       General: Resting in uncomfortable appearing position although in no acute distress.  Cardiology: RRR. No murmurs, rubs, gallops. Abdominal: Bowel sounds intact.  Neurological: Alert although patient does not speak.  Musculoskeletal: Severe cachexia. Patient able to move all four distal extremities.  Psych: Flat affect.   Assessment/Plan:  Principal Problem:   Encephalopathy Active Problems:   Mandibular fracture (HCC)   Protein-calorie malnutrition, severe   Alcohol abuse   Wernicke's encephalopathy   Current severe episode of major depressive disorder without psychotic features (HCC)  # Acute on Chronic Encephalopathy  Waxing and waning persistent encephalopathy. Did not participate much on examination this morning. Multifactorial, including history of TBI, ? hypoactive delirium, ? underlying depression and Wernicke's encephalopathy. Completed 7 days course of high dose IV thiamine and on oral repletion now in addition to fluoxetine.   - SW/CM/TOC assisting in obtaining emergency guardianship for placement at [facility]  - Continue oral thiamine daily - Continue fluoxetine 20mg  daily - Continue PT/OT/SLP - Consider Haldol if patient becomes agitated - Continue delirium protocol   # Severe Malnutrition # Hypoglycemia, resolved  Patient not eating as much in the last couple days but did eat most of his meal this morning. Nutritional  management and SLP on board. Surgery would like to keep on Dysphagia 1 diet until 6 weeks after mandibular ORIF (until 04/09/19). CBG's remain stable.   - Continue Dysphagia 1 diet  - Continue Ensure Enlive, Magic cup, and Hormel Shake three times daily with meals - Continue MVI with minerals daily  - Continue to encourage PO intake and assist with PO intake as needed  - Continue CBG monitoring  - Consider palliative care consult if patient unable to feed himself   Prior to Admission Living Arrangement: Homeless Anticipated Discharge Location: SNF Barriers to Discharge: Emergency Guardianship  Dispo: Anticipated discharge pending placement    Code Status: Full Code  Dr. Internal Medicine PGY-1 Pager: [phone] After 5pm on weekdays and 1pm on weekends: On Call pager [phone]  03/22/2020, 5:47 PM

## 2020-03-22 NOTE — Plan of Care (Signed)
Patient is very withdrawn and non-interactive. Only responds with head nods or head shakes. Laying horizontally in bed. Blood sugar tonight was 87. Will continue to monitor that and see if he can take a drink of juice if he is awake. Plan for discharge is [facility] pending emergency guardianship. No pain or distress noted. Will continue to monitor and continue current POC.
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
## 2020-03-23 ENCOUNTER — Encounter (HOSPITAL_COMMUNITY): Payer: Self-pay | Admitting: Internal Medicine

## 2020-03-23 DIAGNOSIS — F1010 Alcohol abuse, uncomplicated: Secondary | ICD-10-CM | POA: Diagnosis not present

## 2020-03-23 DIAGNOSIS — G9340 Encephalopathy, unspecified: Secondary | ICD-10-CM | POA: Diagnosis not present

## 2020-03-23 DIAGNOSIS — S02609A Fracture of mandible, unspecified, initial encounter for closed fracture: Secondary | ICD-10-CM | POA: Diagnosis not present

## 2020-03-23 DIAGNOSIS — E43 Unspecified severe protein-calorie malnutrition: Secondary | ICD-10-CM | POA: Diagnosis not present

## 2020-03-23 LAB — GLUCOSE, CAPILLARY
Glucose-Capillary: 107 mg/dL — ABNORMAL HIGH (ref 70–99)
Glucose-Capillary: 91 mg/dL (ref 70–99)
Glucose-Capillary: 92 mg/dL (ref 70–99)
Glucose-Capillary: 123 mg/dL — ABNORMAL HIGH (ref 70–99)
Glucose-Capillary: 113 mg/dL — ABNORMAL HIGH (ref 70–99)

## 2020-03-23 MED ORDER — COVID-19 AD26 VACCINE(JANSSEN) 0.5 ML IM SUSP
0.5000 mL | Freq: Once | INTRAMUSCULAR | Status: AC
  Administered 2020-03-23: 0.5 mL via INTRAMUSCULAR
  Filled 2020-03-23: qty 0.5

## 2020-03-23 NOTE — Progress Notes (Signed)
° °  Subjective:   No acute events reported overnight. He does not nod or speak on examination this morning. He does tap both of his feet and tracks with eye movements.   Objective:  Vital signs in last 24 hours: Vitals:   03/22/20 1627 03/22/20 2000 03/23/20 0300 03/23/20 0834  BP: 115/87 109/71 (!) 102/54 (!) 123/103  Pulse: 79 71 68 71  Resp: 18 15 15 20   Temp: 98.5 F (36.9 C) 98.2 F (36.8 C) 97.8 F (36.6 C) 97.6 F (36.4 C)  TempSrc: Oral Axillary Axillary Oral  SpO2: 100% 100% 100% 100%  Weight:      Height:       General: Resting comfortably in no acute distress.  Respiratory: Poor respiratory effort although CTA, bilaterally without increased work of breathing..  Neurological: Awake and alert although patient does not speak or nod. Musculoskeletal: Severe cachexia. Actively able to move bilateral feet.  Psych: Flat affect.   Assessment/Plan:  Principal Problem:   Encephalopathy Active Problems:   Mandibular fracture (HCC)   Protein-calorie malnutrition, severe   Alcohol abuse   Wernicke's encephalopathy   Current severe episode of major depressive disorder without psychotic features (HCC)  # Acute on Chronic Encephalopathy  Waxing and waning persistent encephalopathy. Did not participate much on examination this morning. Multifactorial, including history of TBI, ? hypoactive delirium, ? underlying depression and Wernicke's encephalopathy. Completed 7 days course of high dose IV thiamine and on oral repletion now in addition to fluoxetine.   - Patient given J&J vaccine today - SW/CM/TOC on board; patient is pending APS guardianship and placement at [facility] once vaccinated in 2 weeks  - Continue oral thiamine daily - Continue fluoxetine 20mg  daily - Continue PT/OT/SLP - Consider Haldol if patient becomes agitated - Continue delirium protocol   # Severe Malnutrition # Hypoglycemia, resolved  Patient intermittently not eating, requiring assistance  with meals. Nutritional management and SLP on board. Surgery would like to keep on Dysphagia 1 diet until 6 weeks after mandibular ORIF (until 04/09/19). CBG's remain stable.   - Continue Dysphagia 1 diet  - Continue Ensure Enlive, Magic cup, and Hormel Shake three times daily with meals - Continue MVI with minerals daily  - Continue to encourage PO intake and assist with PO intake as needed  - Continue CBG monitoring  - Consider palliative care consult Monday if remains unable to feed himself   Prior to Admission Living Arrangement: Homeless Anticipated Discharge Location: SNF Barriers to Discharge: Emergency Guardianship, Vaccinated status  Dispo: Anticipated discharge pending placement    Code Status: Full Code  Dr. 06/07/19 Internal Medicine PGY-1 Pager: [phone] After 5pm on weekdays and 1pm on weekends: On Call pager [phone]  03/23/2020, 2:41 PM

## 2020-03-23 NOTE — Plan of Care (Signed)
  Problem: Health Behavior/Discharge Planning: Goal: Ability to manage health-related needs will improve Outcome: Progressing   Problem: Clinical Measurements: Goal: Will remain free from infection Outcome: Progressing   Problem: Activity: Goal: Risk for activity intolerance will decrease Outcome: Progressing   Problem: Nutrition: Goal: Adequate nutrition will be maintained Outcome: Progressing   

## 2020-03-23 NOTE — Plan of Care (Signed)
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
## 2020-03-23 NOTE — TOC Progression Note (Addendum)
Transition of Care [facility]) - Progression Note    Patient Details  Name: Leonard Forbes MRN: [id number] Date of Birth: October 02, 1955  Transition of Care [facility]) CM/SW Contact  Janae Bridgeman, RN Phone Number: 03/23/2020, 11:58 AM  Clinical Narrative:    Case management spoke with Revonda Standard, [facility] with [facility] and they are able to accept the patient for admission to the SNF once the patient is vaccinated for COVID.  The patient is unable to give consent for the vaccine at this time and APS guardianship is pending at this point for the patient.  I spoke with the patient's medical team and they will order a J&J vaccine for the patient to receive and the patient will be able to discharge to [facility] in 2 weeks and will need to be signed in by Gretta Cool, CSW with [facility] department.  I messaged the vaccine group for [facility] hospital for this patient to receive the COVID vaccine.  Patient is pending discharge to [facility] in 2 weeks once he is vaccinated.  03/23/20 - Patient received J&J COVID vaccine today.  The physician H&P was sent to Linde Gillis, MSW with APS to email [email or web address] to include the patient's correct name and date of birth and a message was left on Adrien Carmichael's voice mail at [phone] noted that I sent the updated H&P to assist in obtaining the patient's emergency guardianship that is currently in process.   Expected Discharge Plan: Skilled Nursing Facility Barriers to Discharge: Homeless with medical needs  Expected Discharge Plan and Services Expected Discharge Plan: Skilled Nursing Facility   Discharge Planning Services: CM Consult Post Acute Care Choice: Skilled Nursing Facility Living arrangements for the past 2 months: Homeless                                       Social Determinants of Health (SDOH) Interventions    Readmission Risk Interventions Readmission Risk Prevention Plan 02/28/2020   Transportation Screening Complete  PCP or Specialist Appt within 5-7 Days Complete  Home Care Screening Complete  Medication Review (RN CM) Complete

## 2020-03-24 ENCOUNTER — Encounter (HOSPITAL_COMMUNITY): Payer: Self-pay | Admitting: Internal Medicine

## 2020-03-24 LAB — GLUCOSE, CAPILLARY
Glucose-Capillary: 105 mg/dL — ABNORMAL HIGH (ref 70–99)
Glucose-Capillary: 110 mg/dL — ABNORMAL HIGH (ref 70–99)
Glucose-Capillary: 112 mg/dL — ABNORMAL HIGH (ref 70–99)
Glucose-Capillary: 132 mg/dL — ABNORMAL HIGH (ref 70–99)

## 2020-03-24 NOTE — Progress Notes (Signed)
   Subjective:   No acute events reported overnight. [name] is sitting up in bed and had just finished breakfast when evaluated. He is not answering my questions today but does look at me when I ask questions.   Objective:  Vital signs in last 24 hours: Vitals:   03/24/20 0432 03/24/20 0500 03/24/20 0755 03/24/20 1612  BP: (!) 130/98  (!) 120/101 (!) 125/100  Pulse: 80  83 82  Resp: 16  18 20   Temp: 98.9 F (37.2 C)  98.4 F (36.9 C) 98.6 F (37 C)  TempSrc: Oral  Oral Oral  SpO2: 100%  100% 100%  Weight:  52.2 kg    Height:       General: Resting comfortably in no acute distress.  Neurological: Awake and alert although patient does not speak or nod. Musculoskeletal: Severe cachexia. Psych: Flat affect.   Assessment/Plan:  Principal Problem:   Encephalopathy Active Problems:   Mandibular fracture (HCC)   Protein-calorie malnutrition, severe   Alcohol abuse   Wernicke's encephalopathy   Current severe episode of major depressive disorder without psychotic features (HCC)  # Acute on Chronic Encephalopathy  Waxing and waning persistent encephalopathy. Multifactorial, including history of TBI, ? hypoactive delirium, ? underlying depression and Wernicke's encephalopathy. Completed 7 days course of high dose IV thiamine and on oral repletion now in addition to fluoxetine.   - SW/CM/TOC on board; patient is pending APS guardianship and placement at [facility] once vaccinated in 2 weeks  - Continue oral thiamine daily - Continue fluoxetine 20mg  daily - Continue PT/OT/SLP - Consider Haldol if patient becomes agitated - Continue delirium protocol   # Severe Malnutrition # Hypoglycemia, resolved  Surgery would like to keep on Dysphagia 1 diet until 6 weeks after mandibular ORIF (until 04/09/19). CBG's remain stable so will de-escalate frequency of checks.   - Continue Dysphagia 1 diet  - Continue Ensure Enlive, Magic cup, and Hormel Shake three times daily with  meals - Continue MVI with minerals daily  - Continue to encourage PO intake and assist with PO intake as needed  - Continue CBG monitoring, decreased to BID   Prior to Admission Living Arrangement: Homeless Anticipated Discharge Location: SNF Barriers to Discharge: Emergency Guardianship, Vaccinated status  Dispo: Anticipated discharge pending placement    Code Status: Full Code  Dr. Internal Medicine PGY-2  Pager: [phone] After 5pm on weekdays and 1pm on weekends: On Call pager [phone]  03/24/2020, 5:48 PM

## 2020-03-25 DIAGNOSIS — E512 Wernicke's encephalopathy: Secondary | ICD-10-CM | POA: Diagnosis not present

## 2020-03-25 DIAGNOSIS — E162 Hypoglycemia, unspecified: Secondary | ICD-10-CM | POA: Diagnosis not present

## 2020-03-25 DIAGNOSIS — E43 Unspecified severe protein-calorie malnutrition: Secondary | ICD-10-CM | POA: Diagnosis not present

## 2020-03-25 DIAGNOSIS — F322 Major depressive disorder, single episode, severe without psychotic features: Secondary | ICD-10-CM

## 2020-03-25 LAB — GLUCOSE, CAPILLARY
Glucose-Capillary: 104 mg/dL — ABNORMAL HIGH (ref 70–99)
Glucose-Capillary: 102 mg/dL — ABNORMAL HIGH (ref 70–99)
Glucose-Capillary: 122 mg/dL — ABNORMAL HIGH (ref 70–99)

## 2020-03-25 NOTE — Plan of Care (Signed)
  Problem: Education: Goal: Knowledge of General Education information will improve Description: Including pain rating scale, medication(s)/side effects and non-pharmacologic comfort measures Outcome: Progressing   Problem: Health Behavior/Discharge Planning: Goal: Ability to manage health-related needs will improve Outcome: Progressing   Problem: Clinical Measurements: Goal: Will remain free from infection Outcome: Progressing   Problem: Activity: Goal: Risk for activity intolerance will decrease Outcome: Progressing   Problem: Nutrition: Goal: Adequate nutrition will be maintained Outcome: Progressing   Problem: Safety: Goal: Ability to remain free from injury will improve Outcome: Progressing   

## 2020-03-25 NOTE — Progress Notes (Signed)
   Subjective:   No acute events overnight. Patient does not respond to our  questions but does look up when called by his name.   Objective:  Vital signs in last 24 hours: Vitals:   03/24/20 0755 03/24/20 1612 03/24/20 2012 03/25/20 0555  BP: (!) 120/101 (!) 125/100 (!) 126/102 (!) 134/94  Pulse: 83 82 76 73  Resp: 18 20 15 16   Temp: 98.4 F (36.9 C) 98.6 F (37 C) 98.1 F (36.7 C) 98 F (36.7 C)  TempSrc: Oral Oral Oral Oral  SpO2: 100% 100% 100% 100%  Weight:      Height:       General: Resting comfortably in no acute distress.  Neurological: Awake and alert although patient does not speak.  Musculoskeletal: Severe cachexia. Skin: Mild skin tenting on chest.  Psych: Flat affect.   Assessment/Plan:  Principal Problem:   Encephalopathy Active Problems:   Mandibular fracture (HCC)   Protein-calorie malnutrition, severe   Alcohol abuse   Wernicke's encephalopathy   Current severe episode of major depressive disorder without psychotic features (HCC)  # Acute on Chronic Encephalopathy  Waxing and waning persistent encephalopathy. Multifactorial, including history of TBI, ? hypoactive delirium, ? underlying depression and Wernicke's encephalopathy. Completed 7 days course of high dose IV thiamine and on oral repletion now in addition to fluoxetine.   Stable  - SW/CM/TOC on board; patient is pending APS guardianship and placement at [facility] once vaccinated - Continue oral thiamine daily - Continue fluoxetine 20mg  daily - Continue PT/OT/SLP - Consider Haldol if patient becomes agitated - Continue delirium protocol   # Severe Malnutrition # Hypoglycemia, resolved  Surgery would like to keep on Dysphagia 1 diet until 6 weeks after mandibular ORIF (until 04/09/19).  - Continue Dysphagia 1 diet  - Continue Ensure Enlive, Magic cup, and Hormel Shake three times daily with meals - Continue MVI with minerals daily  - Continue to encourage PO intake and assist with  PO intake as needed  - Continue CBG monitoring BID  Prior to Admission Living Arrangement: Homeless Anticipated Discharge Location: SNF Barriers to Discharge: Emergency Guardianship, Vaccinated status  Dispo: Anticipated discharge pending placement    Code Status: Full Code  Dr. Internal Medicine PGY-2  Pager: [phone] After 5pm on weekdays and 1pm on weekends: On Call pager [phone]  03/25/2020, 7:04 AM

## 2020-03-25 NOTE — Plan of Care (Signed)
Patient is alert, in no acute distress. He is nonverbal. BM this shift, voiding. Problem: Education: Goal: Knowledge of General Education information will improve Description: Including pain rating scale, medication(s)/side effects and non-pharmacologic comfort measures Outcome: Progressing   Problem: Health Behavior/Discharge Planning: Goal: Ability to manage health-related needs will improve Outcome: Progressing   Problem: Clinical Measurements: Goal: Ability to maintain clinical measurements within normal limits will improve Outcome: Progressing Goal: Will remain free from infection Outcome: Progressing Goal: Diagnostic test results will improve Outcome: Progressing Goal: Respiratory complications will improve Outcome: Progressing Goal: Cardiovascular complication will be avoided Outcome: Progressing   Problem: Activity: Goal: Risk for activity intolerance will decrease Outcome: Progressing   Problem: Nutrition: Goal: Adequate nutrition will be maintained Outcome: Progressing   Problem: Coping: Goal: Level of anxiety will decrease Outcome: Progressing   Problem: Elimination: Goal: Will not experience complications related to bowel motility Outcome: Progressing Goal: Will not experience complications related to urinary retention Outcome: Progressing   Problem: Pain Managment: Goal: General experience of comfort will improve Outcome: Progressing   Problem: Safety: Goal: Ability to remain free from injury will improve Outcome: Progressing   Problem: Skin Integrity: Goal: Risk for impaired skin integrity will decrease Outcome: Progressing

## 2020-03-26 DIAGNOSIS — F1010 Alcohol abuse, uncomplicated: Secondary | ICD-10-CM | POA: Diagnosis not present

## 2020-03-26 DIAGNOSIS — E43 Unspecified severe protein-calorie malnutrition: Secondary | ICD-10-CM | POA: Diagnosis not present

## 2020-03-26 DIAGNOSIS — G9340 Encephalopathy, unspecified: Secondary | ICD-10-CM | POA: Diagnosis not present

## 2020-03-26 DIAGNOSIS — S02609A Fracture of mandible, unspecified, initial encounter for closed fracture: Secondary | ICD-10-CM | POA: Diagnosis not present

## 2020-03-26 LAB — GLUCOSE, CAPILLARY
Glucose-Capillary: 94 mg/dL (ref 70–99)
Glucose-Capillary: 115 mg/dL — ABNORMAL HIGH (ref 70–99)
Glucose-Capillary: 126 mg/dL — ABNORMAL HIGH (ref 70–99)

## 2020-03-26 NOTE — TOC Progression Note (Signed)
Transition of Care [facility]) - Progression Note    Patient Details  Name: Hashim Eichhorst MRN: [id number] Date of Birth: [birth year]  Transition of Care [facility]) CM/SW Contact  Janae Bridgeman, RN Phone Number: 03/26/2020, 2:13 PM  Clinical Narrative:    Case management spoke with Ignacia Bayley, MSW at [facility] APS ([email or web address] and a petition has been filed for emergency guardianship at this time.  The patient will need to be served by the court system before the patient can transfer to [facility].  The process for being served might be delayed according to [place], CSW due to the holidays by a week.  Possible discharge to [facility] SNF goal is in 2 weeks as long as the patient has been served by the court.  The medical team at [facility] was notified and is aware of plan to discharge in the next 2-3 weeks or sooner if able.  TOC will continue to follow the patient for discharge to the SNF facility.   Expected Discharge Plan: Skilled Nursing Facility Barriers to Discharge: Homeless with medical needs  Expected Discharge Plan and Services Expected Discharge Plan: Skilled Nursing Facility   Discharge Planning Services: CM Consult Post Acute Care Choice: Skilled Nursing Facility Living arrangements for the past 2 months: Homeless                                       Social Determinants of Health (SDOH) Interventions    Readmission Risk Interventions Readmission Risk Prevention Plan 02/28/2020  Transportation Screening Complete  PCP or Specialist Appt within 5-7 Days Complete  Home Care Screening Complete  Medication Review (RN CM) Complete

## 2020-03-26 NOTE — Plan of Care (Signed)

## 2020-03-26 NOTE — Plan of Care (Signed)
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
## 2020-03-27 DIAGNOSIS — G9340 Encephalopathy, unspecified: Secondary | ICD-10-CM | POA: Diagnosis not present

## 2020-03-27 DIAGNOSIS — E43 Unspecified severe protein-calorie malnutrition: Secondary | ICD-10-CM | POA: Diagnosis not present

## 2020-03-27 DIAGNOSIS — S02609A Fracture of mandible, unspecified, initial encounter for closed fracture: Secondary | ICD-10-CM | POA: Diagnosis not present

## 2020-03-27 DIAGNOSIS — F1010 Alcohol abuse, uncomplicated: Secondary | ICD-10-CM | POA: Diagnosis not present

## 2020-03-27 LAB — GLUCOSE, CAPILLARY: Glucose-Capillary: 93 mg/dL (ref 70–99)

## 2020-03-27 NOTE — Progress Notes (Signed)
   Subjective:   Leonard Forbes appears to be aware that I am speaking with him, asking questions, although does not nod or answer. He does move all four extremities.   History limited secondary to AMS.   Objective:  Vital signs in last 24 hours: Vitals:   03/26/20 1945 03/27/20 0300 03/27/20 0420 03/27/20 0900  BP: 131/88 130/71  130/80  Pulse: 88 81  77  Resp: 17 15    Temp: 98.6 F (37 C) 98.2 F (36.8 C)  98.5 F (36.9 C)  TempSrc: Axillary Axillary  Axillary  SpO2: 100% 98%  97%  Weight:   53.4 kg   Height:       General: Resting comfortably in no acute distress.  Neurological: Awake and alert although patient does not speak.  Musculoskeletal: Severe cachexia. Able to move all four extremities.  Psych: Flat affect.  Skin: Mandibular incision healing well without drainage or other lesions.  Assessment/Plan:  Principal Problem:   Encephalopathy Active Problems:   Mandibular fracture (HCC)   Protein-calorie malnutrition, severe   Alcohol abuse   Wernicke's encephalopathy   Current severe episode of major depressive disorder without psychotic features (HCC)  # Acute on Chronic Encephalopathy  Waxing and waning persistent encephalopathy. Multifactorial, including history of TBI, ? hypoactive delirium, ? underlying depression and Wernicke's encephalopathy. Completed 7 days course of high dose IV thiamine and on oral repletion now in addition to fluoxetine.   - SW/CM/TOC on board; patient is pending APS guardianship and placement at [facility] once vaccinated - Continue oral thiamine daily - Continue fluoxetine 20mg  daily - Continue PT/OT/SLP - Consider Haldol if patient becomes agitated - Continue delirium protocol   # Severe Malnutrition # Hypoglycemia, resolved  Surgery would like to keep on Dysphagia 1 diet until 6 weeks after mandibular ORIF (until 04/09/19).  - Continue Dysphagia 1 diet  - Continue Ensure Enlive, Magic cup, and Hormel Shake three times daily  with meals - Continue MVI with minerals daily  - Continue to encourage PO intake and assist with PO intake as needed  - Anticipate he will need full assistance with oral intake if discharged to SNF - Once guardianship is established, he would likely benefit from palliative care consultation - Continue CBG monitoring BID   Prior to Admission Living Arrangement: Homeless Anticipated Discharge Location: SNF Barriers to Discharge: Emergency Guardianship, Vaccinated status  Dispo: Anticipated discharge pending placement    Code Status: Full Code  Dr. 06/07/19 Internal Medicine PGY-1 Pager: [phone] After 5pm on weekdays and 1pm on weekends: On Call pager [phone]  03/27/2020, 2:36 PM
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
## 2020-03-28 DIAGNOSIS — S02609A Fracture of mandible, unspecified, initial encounter for closed fracture: Secondary | ICD-10-CM | POA: Diagnosis not present

## 2020-03-28 DIAGNOSIS — G9340 Encephalopathy, unspecified: Secondary | ICD-10-CM | POA: Diagnosis not present

## 2020-03-28 DIAGNOSIS — E43 Unspecified severe protein-calorie malnutrition: Secondary | ICD-10-CM | POA: Diagnosis not present

## 2020-03-28 DIAGNOSIS — F1010 Alcohol abuse, uncomplicated: Secondary | ICD-10-CM | POA: Diagnosis not present

## 2020-03-28 LAB — COMPREHENSIVE METABOLIC PANEL
ALT: 18 U/L (ref 0–44)
AST: 15 U/L (ref 15–41)
Albumin: 3.1 g/dL — ABNORMAL LOW (ref 3.5–5.0)
Alkaline Phosphatase: 94 U/L (ref 38–126)
Anion gap: 11 (ref 5–15)
BUN: 37 mg/dL — ABNORMAL HIGH (ref 8–23)
CO2: 26 mmol/L (ref 22–32)
Calcium: 9.2 mg/dL (ref 8.9–10.3)
Chloride: 110 mmol/L (ref 98–111)
Creatinine, Ser: 1 mg/dL (ref 0.61–1.24)
GFR, Estimated: >60 mL/min (ref >60)
Glucose, Bld: 123 mg/dL — ABNORMAL HIGH (ref 70–99)
Potassium: 4.1 mmol/L (ref 3.5–5.1)
Sodium: 147 mmol/L — ABNORMAL HIGH (ref 135–145)
Total Bilirubin: 0.6 mg/dL (ref 0.3–1.2)
Total Protein: 7 g/dL (ref 6.5–8.1)

## 2020-03-28 LAB — CBC
HCT: 38.9 % — ABNORMAL LOW (ref 39.0–52.0)
Hemoglobin: 12.1 g/dL — ABNORMAL LOW (ref 13.0–17.0)
MCH: 28.9 pg (ref 26.0–34.0)
MCHC: 31.1 g/dL (ref 30.0–36.0)
MCV: 93.1 fL (ref 80.0–100.0)
Platelets: 388 10*3/uL (ref 150–400)
RBC: 4.18 MIL/uL — ABNORMAL LOW (ref 4.22–5.81)
RDW: 16.3 % — ABNORMAL HIGH (ref 11.5–15.5)
WBC: 6.1 10*3/uL (ref 4.0–10.5)
nRBC: 0 % (ref 0.0–0.2)

## 2020-03-28 LAB — GLUCOSE, CAPILLARY
Glucose-Capillary: 110 mg/dL — ABNORMAL HIGH (ref 70–99)
Glucose-Capillary: 128 mg/dL — ABNORMAL HIGH (ref 70–99)
Glucose-Capillary: 83 mg/dL (ref 70–99)

## 2020-03-28 MED ORDER — DEXTROSE 5 % IV BOLUS
500.0000 mL | Freq: Once | INTRAVENOUS | Status: AC
  Administered 2020-03-28: 500 mL via INTRAVENOUS

## 2020-03-28 MED ORDER — FREE WATER
200.0000 mL | Freq: Four times a day (QID) | Status: DC
  Administered 2020-03-29: 100 mL via ORAL
  Administered 2020-03-30 – 2020-04-03 (×16): 200 mL via ORAL

## 2020-03-28 NOTE — Progress Notes (Addendum)
Subjective:   [name] is awake and alert as we enter the room today, tracking our movements. He is able to say "no" and nod his head, indicating that he is thirsty and hungry, although not in pain. He nods "yes" when asked if he is tired of his stay in the hospital, but does not further participate during interview.   History limited secondary to AMS.   Objective:  Vital signs in last 24 hours: Vitals:   03/27/20 1500 03/27/20 1952 03/28/20 0300 03/28/20 0600  BP: 137/88 124/88 120/88   Pulse: 80 83 81   Resp:  18 15   Temp: 98.6 F (37 C) 98.6 F (37 C) 98.5 F (36.9 C)   TempSrc: Axillary Axillary Axillary   SpO2: 96% 100% 100%   Weight:    48.4 kg  Height:       General: Resting comfortably in no acute distress.  Neurological: Awake and alert, able to mumble "yes / no" and nod head.  Musculoskeletal: Severe cachexia. Does not move extremities.  Psych: Flat affect.  Skin: Warm and dry with skin flaking on lower extremities, with skin tenting.   Assessment/Plan:  Principal Problem:   Encephalopathy Active Problems:   Mandibular fracture (HCC)   Protein-calorie malnutrition, severe   Alcohol abuse   Wernicke's encephalopathy   Current severe episode of major depressive disorder without psychotic features (HCC)  # Acute on Chronic Encephalopathy  Waxing and waning persistent encephalopathy. Multifactorial, including history of TBI, ? hypoactive delirium, ? underlying depression and Wernicke's encephalopathy. Completed 7 days course of high dose IV thiamine and on oral repletion now in addition to fluoxetine.   - SW/CM/TOC on board; patient is pending APS guardianship [facility] SNF have conditionally accepted patient, pending guardianship and COVID vaccination immunity - However, once guardianship is obtained, patient would benefit from palliative care given continued inability to self-sustain fluids and other PO intake  - Continue oral thiamine daily -  Continue fluoxetine 20mg  daily - Continue PT/OT/SLP - Consider Haldol if patient becomes agitated - Continue delirium / fall protocols   # Hypernatremia  Sodium elevated at 147 today. Patient has been drinking many sugary beverages and requires full assistance with eating and drinking. Skin tenting consistent with dehydration.   - Will give 500cc D5W bolus - Order placed for 200cc's free water q6 hours  - Check morning BMP   # Cachexia  # Malnutrition  Albumin has improved as patient has had increased PO intake. Surgery would like to keep on Dysphagia 1 diet until 6 weeks after mandibular ORIF (until 04/09/19).  - Continue Dysphagia 1 diet  - Continue Ensure Enlive, Magic cup, and Hormel Shake three times daily with meals - Continue MVI with minerals daily  - Continue to encourage PO intake and assist with PO intake as needed  - Anticipate he will need full assistance with oral intake if discharged to SNF - Once guardianship is established, he would benefit from palliative care  - Continue CBG monitoring BID   # Normocytic Anemia, Improving Patient's hemoglobin has increased back to near baseline (up 5 points) s/p upper and lower GI bleeding.   - Repeat CBC intermittently or if signs of active bleeding   Prior to Admission Living Arrangement: Homeless Anticipated Discharge Location: SNF Barriers to Discharge: Emergency Guardianship, Vaccinated status  Dispo: Anticipated discharge pending placement    Code Status: Full Code  Dr. 06/07/19 Internal Medicine PGY-1 Pager: [phone] After 5pm on weekdays and 1pm  on weekends: On Call pager [phone]  03/28/2020, 11:24 AM
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
## 2020-03-28 NOTE — Care Management Important Message (Signed)
Important Message  Patient Details  Name: Leonard Forbes MRN: [id number] Date of Birth: 1955-08-12   Medicare Important Message Given:  Yes     [name] 03/28/2020, 4:32 PM

## 2020-03-29 DIAGNOSIS — F332 Major depressive disorder, recurrent severe without psychotic features: Secondary | ICD-10-CM | POA: Diagnosis not present

## 2020-03-29 DIAGNOSIS — S02609A Fracture of mandible, unspecified, initial encounter for closed fracture: Secondary | ICD-10-CM | POA: Diagnosis not present

## 2020-03-29 DIAGNOSIS — G9340 Encephalopathy, unspecified: Secondary | ICD-10-CM | POA: Diagnosis not present

## 2020-03-29 LAB — BASIC METABOLIC PANEL
Anion gap: 11 (ref 5–15)
BUN: 37 mg/dL — ABNORMAL HIGH (ref 8–23)
CO2: 26 mmol/L (ref 22–32)
Calcium: 9.3 mg/dL (ref 8.9–10.3)
Chloride: 110 mmol/L (ref 98–111)
Creatinine, Ser: 0.96 mg/dL (ref 0.61–1.24)
GFR, Estimated: >60 mL/min (ref >60)
Glucose, Bld: 96 mg/dL (ref 70–99)
Potassium: 4 mmol/L (ref 3.5–5.1)
Sodium: 147 mmol/L — ABNORMAL HIGH (ref 135–145)

## 2020-03-29 LAB — GLUCOSE, CAPILLARY: Glucose-Capillary: 105 mg/dL — ABNORMAL HIGH (ref 70–99)
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
## 2020-03-30 ENCOUNTER — Encounter (HOSPITAL_COMMUNITY): Payer: Self-pay | Admitting: Internal Medicine

## 2020-03-30 LAB — COMPREHENSIVE METABOLIC PANEL
ALT: 16 U/L (ref 0–44)
AST: 15 U/L (ref 15–41)
Albumin: 3.1 g/dL — ABNORMAL LOW (ref 3.5–5.0)
Alkaline Phosphatase: 88 U/L (ref 38–126)
Anion gap: 12 (ref 5–15)
BUN: 42 mg/dL — ABNORMAL HIGH (ref 8–23)
CO2: 24 mmol/L (ref 22–32)
Calcium: 9.1 mg/dL (ref 8.9–10.3)
Chloride: 111 mmol/L (ref 98–111)
Creatinine, Ser: 1.09 mg/dL (ref 0.61–1.24)
GFR, Estimated: >60 mL/min (ref >60)
Glucose, Bld: 102 mg/dL — ABNORMAL HIGH (ref 70–99)
Potassium: 3.8 mmol/L (ref 3.5–5.1)
Sodium: 147 mmol/L — ABNORMAL HIGH (ref 135–145)
Total Bilirubin: 0.6 mg/dL (ref 0.3–1.2)
Total Protein: 6.7 g/dL (ref 6.5–8.1)

## 2020-03-30 LAB — GLUCOSE, CAPILLARY
Glucose-Capillary: 100 mg/dL — ABNORMAL HIGH (ref 70–99)
Glucose-Capillary: 133 mg/dL — ABNORMAL HIGH (ref 70–99)

## 2020-03-30 MED ORDER — DEXTROSE 5 % IV BOLUS
500.0000 mL | Freq: Once | INTRAVENOUS | Status: AC
  Administered 2020-03-30: 500 mL via INTRAVENOUS
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
## 2020-03-31 DIAGNOSIS — S02609A Fracture of mandible, unspecified, initial encounter for closed fracture: Secondary | ICD-10-CM | POA: Diagnosis not present

## 2020-03-31 DIAGNOSIS — F332 Major depressive disorder, recurrent severe without psychotic features: Secondary | ICD-10-CM | POA: Diagnosis not present

## 2020-03-31 DIAGNOSIS — G9340 Encephalopathy, unspecified: Secondary | ICD-10-CM | POA: Diagnosis not present

## 2020-03-31 LAB — GLUCOSE, CAPILLARY
Glucose-Capillary: 141 mg/dL — ABNORMAL HIGH (ref 70–99)
Glucose-Capillary: 114 mg/dL — ABNORMAL HIGH (ref 70–99)
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
## 2020-03-31 NOTE — Progress Notes (Signed)
Managed to get him to drink 240 of watered down breeze- and a kozy cup with medications at 2200

## 2020-04-01 ENCOUNTER — Encounter (HOSPITAL_COMMUNITY): Payer: Self-pay | Admitting: Internal Medicine

## 2020-04-01 LAB — GLUCOSE, CAPILLARY
Glucose-Capillary: 90 mg/dL (ref 70–99)
Glucose-Capillary: 97 mg/dL (ref 70–99)
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
## 2020-04-01 NOTE — Plan of Care (Signed)
Patient is alert, in no acute distress. Tolerates medications crushed in apple sauce. Repositioned in the bed. Condom catheter intact. Problem: Education: Goal: Knowledge of General Education information will improve Description: Including pain rating scale, medication(s)/side effects and non-pharmacologic comfort measures Outcome: Progressing   Problem: Health Behavior/Discharge Planning: Goal: Ability to manage health-related needs will improve Outcome: Progressing   Problem: Clinical Measurements: Goal: Ability to maintain clinical measurements within normal limits will improve Outcome: Progressing Goal: Will remain free from infection Outcome: Progressing Goal: Diagnostic test results will improve Outcome: Progressing Goal: Respiratory complications will improve Outcome: Progressing Goal: Cardiovascular complication will be avoided Outcome: Progressing   Problem: Activity: Goal: Risk for activity intolerance will decrease Outcome: Progressing   Problem: Nutrition: Goal: Adequate nutrition will be maintained Outcome: Progressing   Problem: Coping: Goal: Level of anxiety will decrease Outcome: Progressing   Problem: Elimination: Goal: Will not experience complications related to bowel motility Outcome: Progressing Goal: Will not experience complications related to urinary retention Outcome: Progressing   Problem: Pain Managment: Goal: General experience of comfort will improve Outcome: Progressing   Problem: Safety: Goal: Ability to remain free from injury will improve Outcome: Progressing   Problem: Skin Integrity: Goal: Risk for impaired skin integrity will decrease Outcome: Progressing

## 2020-04-02 DIAGNOSIS — S02609A Fracture of mandible, unspecified, initial encounter for closed fracture: Secondary | ICD-10-CM | POA: Diagnosis not present

## 2020-04-02 DIAGNOSIS — G9340 Encephalopathy, unspecified: Secondary | ICD-10-CM | POA: Diagnosis not present

## 2020-04-02 LAB — GLUCOSE, CAPILLARY: Glucose-Capillary: 81 mg/dL (ref 70–99)

## 2020-04-02 MED ORDER — FLUOXETINE HCL 20 MG PO CAPS
40.0000 mg | ORAL_CAPSULE | Freq: Every day | ORAL | Status: DC
  Administered 2020-04-04 – 2020-04-29 (×16): 40 mg via ORAL
  Filled 2020-04-02 (×26): qty 2
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
## 2020-04-03 LAB — GLUCOSE, CAPILLARY: Glucose-Capillary: 87 mg/dL (ref 70–99)

## 2020-04-03 NOTE — Progress Notes (Signed)
PT Cancellation Note  Patient Details Name: Leonard Forbes MRN: [id number] DOB: [birth year]   Cancelled Treatment:    Reason Eval/Treat Not Completed: Patient declined, no reason specified;Fatigue/lethargy limiting ability to participate Patient states "not right now" when asked to get OOB to move. Encouraged patient with benefits of mobility, patient shook head no. PT noticed patient seemed more fatigued this session. PT will re-attempt as time allows.   Gregor Hams, PT, DPT Acute Rehabilitation Services Pager [phone] Office [phone]    [name] 04/03/2020, 11:16 AM
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
## 2020-04-03 NOTE — Plan of Care (Signed)

## 2020-04-04 ENCOUNTER — Encounter (HOSPITAL_COMMUNITY): Payer: Self-pay | Admitting: Internal Medicine

## 2020-04-04 DIAGNOSIS — G9340 Encephalopathy, unspecified: Secondary | ICD-10-CM | POA: Diagnosis not present

## 2020-04-04 DIAGNOSIS — S02609A Fracture of mandible, unspecified, initial encounter for closed fracture: Secondary | ICD-10-CM | POA: Diagnosis not present

## 2020-04-04 LAB — GLUCOSE, CAPILLARY
Glucose-Capillary: 77 mg/dL (ref 70–99)
Glucose-Capillary: 138 mg/dL — ABNORMAL HIGH (ref 70–99)

## 2020-04-04 MED ORDER — MIRTAZAPINE 15 MG PO TABS
7.5000 mg | ORAL_TABLET | Freq: Every day | ORAL | Status: DC
  Administered 2020-04-05 – 2020-04-27 (×21): 7.5 mg via ORAL
  Filled 2020-04-04 (×21): qty 1
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
## 2020-04-04 NOTE — TOC Progression Note (Addendum)
Transition of Care [facility]) - Progression Note    Patient Details  Name: Leonard Forbes MRN: [id number] Date of Birth: [birth year]  Transition of Care [facility]) CM/SW Contact  Janae Bridgeman, RN Phone Number: 04/04/2020, 2:01 PM  Clinical Narrative:    Case management called and left a message with Ignacia Bayley, MSW with APS to inquire if the patient was served guardianship papers in preparation for his pending admission to [facility] when he is able to be transferred.  Marlowe Aschoff, MSW will be out of the APS office through 04/08/2020 -  Will contact the following MSW at APS when I'm able to establish this information.  TOC will continue to follow the patient for admission to [facility].  04/04/2020- Spoke with Revonda Standard, CM at [facility] and they have an open bed for the patient and are expecting to admit him once I am able to clarify with APS that the patient has been served court papers for guardianship.   Expected Discharge Plan: Skilled Nursing Facility Barriers to Discharge: Homeless with medical needs  Expected Discharge Plan and Services Expected Discharge Plan: Skilled Nursing Facility   Discharge Planning Services: CM Consult Post Acute Care Choice: Skilled Nursing Facility Living arrangements for the past 2 months: Homeless                                       Social Determinants of Health (SDOH) Interventions    Readmission Risk Interventions Readmission Risk Prevention Plan 02/28/2020  Transportation Screening Complete  PCP or Specialist Appt within 5-7 Days Complete  Home Care Screening Complete  Medication Review (RN CM) Complete
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
## 2020-04-05 LAB — CBC
HCT: 45.2 % (ref 39.0–52.0)
Hemoglobin: 13.7 g/dL (ref 13.0–17.0)
MCH: 27.5 pg (ref 26.0–34.0)
MCHC: 30.3 g/dL (ref 30.0–36.0)
MCV: 90.6 fL (ref 80.0–100.0)
Platelets: 411 10*3/uL — ABNORMAL HIGH (ref 150–400)
RBC: 4.99 MIL/uL (ref 4.22–5.81)
RDW: 14.9 % (ref 11.5–15.5)
WBC: 6 10*3/uL (ref 4.0–10.5)
nRBC: 0 % (ref 0.0–0.2)

## 2020-04-05 LAB — BASIC METABOLIC PANEL
Anion gap: 12 (ref 5–15)
BUN: 30 mg/dL — ABNORMAL HIGH (ref 8–23)
CO2: 26 mmol/L (ref 22–32)
Calcium: 9.6 mg/dL (ref 8.9–10.3)
Chloride: 107 mmol/L (ref 98–111)
Creatinine, Ser: 1 mg/dL (ref 0.61–1.24)
GFR, Estimated: >60 mL/min (ref >60)
Glucose, Bld: 96 mg/dL (ref 70–99)
Potassium: 4.3 mmol/L (ref 3.5–5.1)
Sodium: 145 mmol/L (ref 135–145)

## 2020-04-05 LAB — GLUCOSE, CAPILLARY
Glucose-Capillary: 67 mg/dL — ABNORMAL LOW (ref 70–99)
Glucose-Capillary: 103 mg/dL — ABNORMAL HIGH (ref 70–99)
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
## 2020-04-06 ENCOUNTER — Encounter (HOSPITAL_COMMUNITY): Payer: Self-pay | Admitting: Internal Medicine

## 2020-04-06 DIAGNOSIS — R64 Cachexia: Secondary | ICD-10-CM

## 2020-04-06 DIAGNOSIS — G9349 Other encephalopathy: Secondary | ICD-10-CM | POA: Diagnosis not present

## 2020-04-06 DIAGNOSIS — Z95828 Presence of other vascular implants and grafts: Secondary | ICD-10-CM

## 2020-04-06 DIAGNOSIS — Z8781 Personal history of (healed) traumatic fracture: Secondary | ICD-10-CM

## 2020-04-06 DIAGNOSIS — E162 Hypoglycemia, unspecified: Secondary | ICD-10-CM | POA: Diagnosis not present

## 2020-04-06 DIAGNOSIS — E43 Unspecified severe protein-calorie malnutrition: Secondary | ICD-10-CM | POA: Diagnosis not present

## 2020-04-06 LAB — GLUCOSE, CAPILLARY
Glucose-Capillary: 69 mg/dL — ABNORMAL LOW (ref 70–99)
Glucose-Capillary: 79 mg/dL (ref 70–99)
Glucose-Capillary: 81 mg/dL (ref 70–99)
Glucose-Capillary: 82 mg/dL (ref 70–99)
Glucose-Capillary: 82 mg/dL (ref 70–99)
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
## 2020-04-06 NOTE — Progress Notes (Signed)
Hypoglycemic Event  CBG: 69  Treatment: Gave grape juice  Symptoms: none  Follow-up CBG: Time :0710 CBG Result:82  Possible Reasons for Event: poor oral intake   Comments/MD notified: No  per Dr. Jerene Canny note stated she is aware of patient having hypoglycemic events. Patient asymptomatic and hypoglycemic protocol initiated and patient CBG improved with treatment     Avril Busser, [place]
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
## 2020-04-07 DIAGNOSIS — G9349 Other encephalopathy: Secondary | ICD-10-CM | POA: Diagnosis not present

## 2020-04-07 DIAGNOSIS — E162 Hypoglycemia, unspecified: Secondary | ICD-10-CM | POA: Diagnosis not present

## 2020-04-07 DIAGNOSIS — E43 Unspecified severe protein-calorie malnutrition: Secondary | ICD-10-CM | POA: Diagnosis not present

## 2020-04-07 DIAGNOSIS — R64 Cachexia: Secondary | ICD-10-CM | POA: Diagnosis not present

## 2020-04-07 LAB — GLUCOSE, CAPILLARY
Glucose-Capillary: 109 mg/dL — ABNORMAL HIGH (ref 70–99)
Glucose-Capillary: 118 mg/dL — ABNORMAL HIGH (ref 70–99)
Glucose-Capillary: 80 mg/dL (ref 70–99)
Glucose-Capillary: 81 mg/dL (ref 70–99)
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
## 2020-04-08 DIAGNOSIS — E43 Unspecified severe protein-calorie malnutrition: Secondary | ICD-10-CM | POA: Diagnosis not present

## 2020-04-08 DIAGNOSIS — E162 Hypoglycemia, unspecified: Secondary | ICD-10-CM | POA: Diagnosis not present

## 2020-04-08 DIAGNOSIS — G9349 Other encephalopathy: Secondary | ICD-10-CM | POA: Diagnosis not present

## 2020-04-08 DIAGNOSIS — R64 Cachexia: Secondary | ICD-10-CM | POA: Diagnosis not present

## 2020-04-08 LAB — GLUCOSE, CAPILLARY
Glucose-Capillary: 108 mg/dL — ABNORMAL HIGH (ref 70–99)
Glucose-Capillary: 99 mg/dL (ref 70–99)

## 2020-04-08 NOTE — Plan of Care (Signed)
°  Problem: Activity: Goal: Risk for activity intolerance will decrease Outcome: Progressing   Problem: Nutrition: Goal: Adequate nutrition will be maintained Outcome: Progressing   Problem: Coping: Goal: Level of anxiety will decrease Outcome: Progressing   Problem: Safety: Goal: Ability to remain free from injury will improve Outcome: Progressing   Problem: Education: Goal: Knowledge of General Education information will improve Description: Including pain rating scale, medication(s)/side effects and non-pharmacologic comfort measures Outcome: Progressing
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
## 2020-04-08 NOTE — Progress Notes (Signed)
SLP Cancellation Note  Patient Details Name: Eastin Swing MRN: [id number] DOB: [birth year]    New orders received to assess for diet advancement.  Will defer to oral surgeon who performed ORIF to mandible on 11/20.  Daksh Coates L. [name], MA CCC/SLP Acute Rehabilitation Services Office number [phone] Pager [phone]           [name] 04/08/2020, 10:05 AM

## 2020-04-08 NOTE — Plan of Care (Signed)
°  Problem: Education: Goal: Knowledge of General Education information will improve Description: Including pain rating scale, medication(s)/side effects and non-pharmacologic comfort measures Outcome: Progressing   Problem: Clinical Measurements: Goal: Respiratory complications will improve Outcome: Progressing   Problem: Coping: Goal: Level of anxiety will decrease Outcome: Progressing   Problem: Elimination: Goal: Will not experience complications related to urinary retention Outcome: Progressing

## 2020-04-08 NOTE — Plan of Care (Signed)
  Problem: Safety: Goal: Ability to remain free from injury will improve Outcome: Progressing   Problem: Skin Integrity: Goal: Risk for impaired skin integrity will decrease Outcome: Progressing   

## 2020-04-09 DIAGNOSIS — R64 Cachexia: Secondary | ICD-10-CM | POA: Diagnosis not present

## 2020-04-09 DIAGNOSIS — Z5989 Other problems related to housing and economic circumstances: Secondary | ICD-10-CM

## 2020-04-09 DIAGNOSIS — G9349 Other encephalopathy: Secondary | ICD-10-CM | POA: Diagnosis not present

## 2020-04-09 DIAGNOSIS — E162 Hypoglycemia, unspecified: Secondary | ICD-10-CM | POA: Diagnosis not present

## 2020-04-09 DIAGNOSIS — G9340 Encephalopathy, unspecified: Secondary | ICD-10-CM | POA: Diagnosis not present

## 2020-04-09 DIAGNOSIS — E43 Unspecified severe protein-calorie malnutrition: Secondary | ICD-10-CM | POA: Diagnosis not present

## 2020-04-09 LAB — GLUCOSE, CAPILLARY
Glucose-Capillary: 72 mg/dL (ref 70–99)
Glucose-Capillary: 82 mg/dL (ref 70–99)
Glucose-Capillary: 96 mg/dL (ref 70–99)

## 2020-04-09 MED ORDER — ENOXAPARIN SODIUM 40 MG/0.4ML ~~LOC~~ SOLN
40.0000 mg | SUBCUTANEOUS | Status: DC
  Administered 2020-04-09 – 2020-04-23 (×13): 40 mg via SUBCUTANEOUS
  Filled 2020-04-09 (×15): qty 0.4

## 2020-04-09 MED ORDER — ARIPIPRAZOLE 2 MG PO TABS
5.0000 mg | ORAL_TABLET | Freq: Every day | ORAL | Status: DC
  Administered 2020-04-10 – 2020-04-17 (×4): 5 mg via ORAL
  Filled 2020-04-09 (×8): qty 3

## 2020-04-09 MED ORDER — ARIPIPRAZOLE 2 MG PO TABS
2.0000 mg | ORAL_TABLET | Freq: Once | ORAL | Status: AC
  Administered 2020-04-09: 2 mg via ORAL
  Filled 2020-04-09: qty 1
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
## 2020-04-09 NOTE — Plan of Care (Signed)
°  Problem: Health Behavior/Discharge Planning: Goal: Ability to manage health-related needs will improve Outcome: Progressing   Problem: Clinical Measurements: Goal: Ability to maintain clinical measurements within normal limits will improve Outcome: Progressing   Problem: Activity: Goal: Risk for activity intolerance will decrease Outcome: Progressing   Problem: Nutrition: Goal: Adequate nutrition will be maintained Outcome: Progressing   Problem: Coping: Goal: Level of anxiety will decrease Outcome: Progressing   Problem: Elimination: Goal: Will not experience complications related to bowel motility Outcome: Progressing   Problem: Pain Managment: Goal: General experience of comfort will improve Outcome: Progressing   Problem: Safety: Goal: Ability to remain free from injury will improve Outcome: Progressing   Problem: Skin Integrity: Goal: Risk for impaired skin integrity will decrease Outcome: Progressing

## 2020-04-10 DIAGNOSIS — E860 Dehydration: Secondary | ICD-10-CM

## 2020-04-10 DIAGNOSIS — E878 Other disorders of electrolyte and fluid balance, not elsewhere classified: Secondary | ICD-10-CM

## 2020-04-10 DIAGNOSIS — R64 Cachexia: Secondary | ICD-10-CM | POA: Diagnosis not present

## 2020-04-10 DIAGNOSIS — E43 Unspecified severe protein-calorie malnutrition: Secondary | ICD-10-CM | POA: Diagnosis not present

## 2020-04-10 DIAGNOSIS — F202 Catatonic schizophrenia: Secondary | ICD-10-CM

## 2020-04-10 DIAGNOSIS — N179 Acute kidney failure, unspecified: Secondary | ICD-10-CM

## 2020-04-10 DIAGNOSIS — G9349 Other encephalopathy: Secondary | ICD-10-CM | POA: Diagnosis not present

## 2020-04-10 DIAGNOSIS — E870 Hyperosmolality and hypernatremia: Secondary | ICD-10-CM

## 2020-04-10 LAB — GLUCOSE, CAPILLARY
Glucose-Capillary: 99 mg/dL (ref 70–99)
Glucose-Capillary: 125 mg/dL — ABNORMAL HIGH (ref 70–99)

## 2020-04-10 NOTE — Plan of Care (Signed)
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
## 2020-04-10 NOTE — TOC Progression Note (Signed)
Transition of Care [facility]) - Progression Note    Patient Details  Name: Leonard Forbes MRN: [id number] Date of Birth: 06/22/1955  [facility]) CM/SW Contact  Bess Kinds, RN Phone Number: [phone] 04/10/2020, 11:54 AM  Clinical Narrative:     Secure email and voicemail sent to APS social worker to request update on guardianship status. Contact information provided for follow up.   Sp[facility] with Revonda Standard at [facility]. Facility is ready to accept patient once guardianship paperwork has been filed.   TOC following for transition needs.   Expected Discharge Plan: Skilled Nursing Facility Barriers to Discharge: Homeless with medical needs  Expected Discharge Plan and Services Expected Discharge Plan: Skilled Nursing Facility   Discharge Planning Services: CM Consult Post Acute Care Choice: Skilled Nursing Facility Living arrangements for the past 2 months: Homeless                                       Social Determinants of Health (SDOH) Interventions    Readmission Risk Interventions Readmission Risk Prevention Plan 02/28/2020  Transportation Screening Complete  PCP or Specialist Appt within 5-7 Days Complete  Home Care Screening Complete  Medication Review (RN CM) Complete

## 2020-04-11 DIAGNOSIS — E43 Unspecified severe protein-calorie malnutrition: Secondary | ICD-10-CM | POA: Diagnosis not present

## 2020-04-11 DIAGNOSIS — G9349 Other encephalopathy: Secondary | ICD-10-CM | POA: Diagnosis not present

## 2020-04-11 DIAGNOSIS — R64 Cachexia: Secondary | ICD-10-CM | POA: Diagnosis not present

## 2020-04-11 DIAGNOSIS — N179 Acute kidney failure, unspecified: Secondary | ICD-10-CM | POA: Diagnosis not present

## 2020-04-11 LAB — BASIC METABOLIC PANEL
Anion gap: 12 (ref 5–15)
BUN: 41 mg/dL — ABNORMAL HIGH (ref 8–23)
CO2: 26 mmol/L (ref 22–32)
Calcium: 9.3 mg/dL (ref 8.9–10.3)
Chloride: 116 mmol/L — ABNORMAL HIGH (ref 98–111)
Creatinine, Ser: 1.28 mg/dL — ABNORMAL HIGH (ref 0.61–1.24)
GFR, Estimated: >60 mL/min (ref >60)
Glucose, Bld: 111 mg/dL — ABNORMAL HIGH (ref 70–99)
Potassium: 4.1 mmol/L (ref 3.5–5.1)
Sodium: 154 mmol/L — ABNORMAL HIGH (ref 135–145)

## 2020-04-11 LAB — GLUCOSE, CAPILLARY
Glucose-Capillary: 109 mg/dL — ABNORMAL HIGH (ref 70–99)
Glucose-Capillary: 121 mg/dL — ABNORMAL HIGH (ref 70–99)
Glucose-Capillary: 125 mg/dL — ABNORMAL HIGH (ref 70–99)
Glucose-Capillary: 126 mg/dL — ABNORMAL HIGH (ref 70–99)

## 2020-04-11 LAB — CBC
HCT: 48.1 % (ref 39.0–52.0)
Hemoglobin: 14.4 g/dL (ref 13.0–17.0)
MCH: 27.5 pg (ref 26.0–34.0)
MCHC: 29.9 g/dL — ABNORMAL LOW (ref 30.0–36.0)
MCV: 92 fL (ref 80.0–100.0)
Platelets: 332 10*3/uL (ref 150–400)
RBC: 5.23 MIL/uL (ref 4.22–5.81)
RDW: 15.1 % (ref 11.5–15.5)
WBC: 6.1 10*3/uL (ref 4.0–10.5)
nRBC: 0 % (ref 0.0–0.2)

## 2020-04-11 MED ORDER — LACTATED RINGERS IV SOLN: INTRAVENOUS | Status: DC

## 2020-04-11 MED ORDER — DEXTROSE-NACL 5-0.45 % IV SOLN: INTRAVENOUS | Status: DC

## 2020-04-11 MED ORDER — PROSOURCE PLUS PO LIQD
30.0000 mL | Freq: Three times a day (TID) | ORAL | Status: DC
  Administered 2020-04-11 – 2020-04-22 (×18): 30 mL via ORAL
  Filled 2020-04-11 (×24): qty 30
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
## 2020-04-11 NOTE — Progress Notes (Signed)
Placed order to iv team to have iv placement since fluids was ordered for 0745 this morning.  Patient currently does not have iv site.

## 2020-04-12 DIAGNOSIS — R64 Cachexia: Secondary | ICD-10-CM | POA: Diagnosis not present

## 2020-04-12 DIAGNOSIS — F1020 Alcohol dependence, uncomplicated: Secondary | ICD-10-CM | POA: Diagnosis not present

## 2020-04-12 DIAGNOSIS — F329 Major depressive disorder, single episode, unspecified: Secondary | ICD-10-CM | POA: Diagnosis not present

## 2020-04-12 DIAGNOSIS — E512 Wernicke's encephalopathy: Secondary | ICD-10-CM | POA: Diagnosis not present

## 2020-04-12 LAB — BASIC METABOLIC PANEL
Anion gap: 11 (ref 5–15)
BUN: 37 mg/dL — ABNORMAL HIGH (ref 8–23)
CO2: 26 mmol/L (ref 22–32)
Calcium: 8.7 mg/dL — ABNORMAL LOW (ref 8.9–10.3)
Chloride: 114 mmol/L — ABNORMAL HIGH (ref 98–111)
Creatinine, Ser: 1.09 mg/dL (ref 0.61–1.24)
GFR, Estimated: >60 mL/min (ref >60)
Glucose, Bld: 103 mg/dL — ABNORMAL HIGH (ref 70–99)
Potassium: 3.7 mmol/L (ref 3.5–5.1)
Sodium: 151 mmol/L — ABNORMAL HIGH (ref 135–145)

## 2020-04-12 LAB — GLUCOSE, CAPILLARY
Glucose-Capillary: 97 mg/dL (ref 70–99)
Glucose-Capillary: 82 mg/dL (ref 70–99)

## 2020-04-12 MED ORDER — DEXTROSE 5 % IV SOLN: INTRAVENOUS | Status: AC
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
## 2020-04-12 NOTE — TOC Progression Note (Signed)
Transition of Care [facility]) - Progression Note    Patient Details  Name: Leonard Forbes MRN: [id number] Date of Birth: 03-24-1956  Transition of [facility]) CM/SW Contact  Bess Kinds, RN Phone Number: [phone] 04/12/2020, 9:04 AM  Clinical Narrative:     Late Entry for 04/10/2020 - Received call back from Ignacia Bayley at [facility]. Patient has been deemed not appropriate for [facility] to pursue guardianship. However, if [facility] would like to pursue guardianship, then [facility] can be listed as the guardian. Spoke with Psychologist, educational, Jiles Crocker, who followed up with [name]. [name] to pursue guardianship. TOC following for transition needs.   Expected Discharge Plan: Skilled Nursing Facility Barriers to Discharge: Homeless with medical needs  Expected Discharge Plan and Services Expected Discharge Plan: Skilled Nursing Facility   Discharge Planning Services: CM Consult Post Acute Care Choice: Skilled Nursing Facility Living arrangements for the past 2 months: Homeless                                       Social Determinants of Health (SDOH) Interventions    Readmission Risk Interventions Readmission Risk Prevention Plan 02/28/2020  Transportation Screening Complete  PCP or Specialist Appt within 5-7 Days Complete  Home Care Screening Complete  Medication Review (RN CM) Complete

## 2020-04-12 NOTE — Plan of Care (Signed)
  Problem: Clinical Measurements: Goal: Will remain free from infection Outcome: Progressing   Problem: Education: Goal: Knowledge of General Education information will improve Description: Including pain rating scale, medication(s)/side effects and non-pharmacologic comfort measures Outcome: Not Progressing   Problem: Activity: Goal: Risk for activity intolerance will decrease Outcome: Not Progressing   Problem: Nutrition: Goal: Adequate nutrition will be maintained Outcome: Not Progressing

## 2020-04-12 NOTE — Plan of Care (Signed)

## 2020-04-12 NOTE — Progress Notes (Signed)
Pt drank 2 ensure shakes for me today. He said no to his food and he said no to the lovenox shot and medications today. Not progressing for nutrition.

## 2020-04-13 DIAGNOSIS — E512 Wernicke's encephalopathy: Secondary | ICD-10-CM | POA: Diagnosis not present

## 2020-04-13 DIAGNOSIS — R64 Cachexia: Secondary | ICD-10-CM | POA: Diagnosis not present

## 2020-04-13 DIAGNOSIS — F1020 Alcohol dependence, uncomplicated: Secondary | ICD-10-CM | POA: Diagnosis not present

## 2020-04-13 DIAGNOSIS — F329 Major depressive disorder, single episode, unspecified: Secondary | ICD-10-CM | POA: Diagnosis not present

## 2020-04-13 LAB — GLUCOSE, CAPILLARY
Glucose-Capillary: 101 mg/dL — ABNORMAL HIGH (ref 70–99)
Glucose-Capillary: 114 mg/dL — ABNORMAL HIGH (ref 70–99)
Glucose-Capillary: 98 mg/dL (ref 70–99)

## 2020-04-13 LAB — BASIC METABOLIC PANEL
Anion gap: 9 (ref 5–15)
BUN: 30 mg/dL — ABNORMAL HIGH (ref 8–23)
CO2: 28 mmol/L (ref 22–32)
Calcium: 8.7 mg/dL — ABNORMAL LOW (ref 8.9–10.3)
Chloride: 110 mmol/L (ref 98–111)
Creatinine, Ser: 0.94 mg/dL (ref 0.61–1.24)
GFR, Estimated: >60 mL/min (ref >60)
Glucose, Bld: 98 mg/dL (ref 70–99)
Potassium: 3.5 mmol/L (ref 3.5–5.1)
Sodium: 147 mmol/L — ABNORMAL HIGH (ref 135–145)

## 2020-04-13 MED ORDER — DEXTROSE 5 % IV SOLN: INTRAVENOUS | Status: AC

## 2020-04-13 NOTE — Plan of Care (Signed)
  Problem: Pain Managment: Goal: General experience of comfort will improve Outcome: Progressing   Problem: Safety: Goal: Ability to remain free from injury will improve Outcome: Progressing   Problem: Education: Goal: Knowledge of General Education information will improve Description: Including pain rating scale, medication(s)/side effects and non-pharmacologic comfort measures Outcome: Not Progressing   Problem: Health Behavior/Discharge Planning: Goal: Ability to manage health-related needs will improve Outcome: Not Progressing   Problem: Nutrition: Goal: Adequate nutrition will be maintained Outcome: Not Progressing
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
## 2020-04-14 DIAGNOSIS — G9340 Encephalopathy, unspecified: Secondary | ICD-10-CM | POA: Diagnosis not present

## 2020-04-14 DIAGNOSIS — F1010 Alcohol abuse, uncomplicated: Secondary | ICD-10-CM | POA: Diagnosis not present

## 2020-04-14 DIAGNOSIS — E43 Unspecified severe protein-calorie malnutrition: Secondary | ICD-10-CM | POA: Diagnosis not present

## 2020-04-14 DIAGNOSIS — S02609A Fracture of mandible, unspecified, initial encounter for closed fracture: Secondary | ICD-10-CM | POA: Diagnosis not present

## 2020-04-14 LAB — BASIC METABOLIC PANEL
Anion gap: 8 (ref 5–15)
BUN: 26 mg/dL — ABNORMAL HIGH (ref 8–23)
CO2: 28 mmol/L (ref 22–32)
Calcium: 8.3 mg/dL — ABNORMAL LOW (ref 8.9–10.3)
Chloride: 107 mmol/L (ref 98–111)
Creatinine, Ser: 0.94 mg/dL (ref 0.61–1.24)
GFR, Estimated: >60 mL/min (ref >60)
Glucose, Bld: 96 mg/dL (ref 70–99)
Potassium: 3.5 mmol/L (ref 3.5–5.1)
Sodium: 143 mmol/L (ref 135–145)

## 2020-04-14 LAB — GLUCOSE, CAPILLARY
Glucose-Capillary: 112 mg/dL — ABNORMAL HIGH (ref 70–99)
Glucose-Capillary: 83 mg/dL (ref 70–99)
Glucose-Capillary: 83 mg/dL (ref 70–99)

## 2020-04-14 MED ORDER — LACTATED RINGERS IV SOLN: INTRAVENOUS | Status: AC
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
## 2020-04-15 LAB — GLUCOSE, CAPILLARY: Glucose-Capillary: 85 mg/dL (ref 70–99)
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
## 2020-04-16 DIAGNOSIS — S02609A Fracture of mandible, unspecified, initial encounter for closed fracture: Secondary | ICD-10-CM | POA: Diagnosis not present

## 2020-04-16 DIAGNOSIS — F1010 Alcohol abuse, uncomplicated: Secondary | ICD-10-CM | POA: Diagnosis not present

## 2020-04-16 DIAGNOSIS — E43 Unspecified severe protein-calorie malnutrition: Secondary | ICD-10-CM | POA: Diagnosis not present

## 2020-04-16 DIAGNOSIS — G9340 Encephalopathy, unspecified: Secondary | ICD-10-CM | POA: Diagnosis not present

## 2020-04-16 LAB — BASIC METABOLIC PANEL
Anion gap: 11 (ref 5–15)
BUN: 25 mg/dL — ABNORMAL HIGH (ref 8–23)
CO2: 24 mmol/L (ref 22–32)
Calcium: 8.5 mg/dL — ABNORMAL LOW (ref 8.9–10.3)
Chloride: 103 mmol/L (ref 98–111)
Creatinine, Ser: 0.93 mg/dL (ref 0.61–1.24)
GFR, Estimated: >60 mL/min (ref >60)
Glucose, Bld: 84 mg/dL (ref 70–99)
Potassium: 3.9 mmol/L (ref 3.5–5.1)
Sodium: 138 mmol/L (ref 135–145)

## 2020-04-16 LAB — GLUCOSE, CAPILLARY
Glucose-Capillary: 79 mg/dL (ref 70–99)
Glucose-Capillary: 81 mg/dL (ref 70–99)

## 2020-04-16 NOTE — Progress Notes (Signed)
Subjective:  Overnight, no acute events.  This morning, he only gives nonverbal responses to questions. Does not clearly indicate pain or anything other complaints.  Objective:  Vital signs in last 24 hours: Vitals:   04/15/20 0900 04/15/20 1530 04/15/20 2101 04/16/20 0504  BP: (!) 151/90 (!) 151/77 (!) 120/96 (!) 141/94  Pulse: 76 77 (!) 59 (!) 58  Resp:  17 15 18   Temp: 98.5 F (36.9 C) 98.8 F (37.1 C) 97.7 F (36.5 C) 97.6 F (36.4 C)  TempSrc: Oral Oral Oral Oral  SpO2: 99%  99% 100%  Weight:      Height:      SpO2: 100 % O2 Flow Rate (L/min): 2 L/min  Intake/Output Summary (Last 24 hours) at 04/16/2020 0647 Last data filed at 04/15/2020 1900 Gross per 24 hour  Intake 420 ml  Output --  Net 420 ml   Filed Weights   04/08/20 0500 04/10/20 0500 04/13/20 0500  Weight: 47.1 kg 46.9 kg 50.9 kg  Physical Exam Vitals reviewed.  Constitutional:      General: He is not in acute distress.    Appearance: He is ill-appearing.  HENT:     Mouth/Throat:     Mouth: Mucous membranes are dry.     Pharynx: Oropharynx is clear.  Eyes:     Extraocular Movements: Extraocular movements intact.     Conjunctiva/sclera: Conjunctivae normal.  Cardiovascular:     Rate and Rhythm: Normal rate and regular rhythm.     Pulses: Normal pulses.     Heart sounds: Normal heart sounds.  Pulmonary:     Effort: Pulmonary effort is normal. No respiratory distress.  Abdominal:     General: Abdomen is flat. Bowel sounds are normal.     Palpations: Abdomen is soft.     Tenderness: There is no abdominal tenderness.  Skin:    General: Skin is warm and dry.  Neurological:     General: No focal deficit present.  Psychiatric:     Comments: Flat affect, withdrawn with depressed mood    LABS:  BMP Latest Ref Rng & Units 04/16/2020 04/14/2020 04/13/2020  Glucose 70 - 99 mg/dL 84 96 98  BUN 8 - 23 mg/dL 06/11/2020) 99(B) 71(I)  Creatinine 0.61 - 1.24 mg/dL 96(V 8.93 8.10  Sodium 135 - 145 mmol/L 138 143  147(H)  Potassium 3.5 - 5.1 mmol/L 3.9 3.5 3.5  Chloride 98 - 111 mmol/L 103 107 110  CO2 22 - 32 mmol/L 24 28 28   Calcium 8.9 - 10.3 mg/dL 1.75) 8.3(L) 8.7(L)   Glucose 24h - 79 to 85  IMAGING: No results found.  Assessment/Plan:  Principal Problem:   Current severe episode of major depressive disorder without psychotic features (HCC) Active Problems:   Mandibular fracture (HCC)   Protein-calorie malnutrition, severe   Alcohol use disorder, moderate, dependence (HCC)   Wernicke's encephalopathy  Leonard Forbes is a 65 year old male with past medical history significant for polysubstance abuse, unstable housing, prior facial trauma s/p craniotomy and vp shunt admitted on 11/18 for facial trauma s/p ORIF of L mandible and teeth extraction with hospital course complicated by severe encephalopathy in setting of depression, catatonia, mutism and malnutrition.  #Chronic malnutrition Patient continues to have minimal interest in eating or drinking, he requires full assistance for his meals. Following evaluation by SLP yesterday, patient was transitioned to dysphagia 2 from dysphagia 1 diet. Electrolytes are within normal limits on basic metabolic panel this morning. -Repeat BMP occassionally -Continue dysphagia 2  diet with support from staff for feeding  #Severe Depression, Catatonia and Mutism #Wernicke's Encephalopathy  Unclear baseline but based on prior history, appear to be much less responsive compared to prior to admission. Head imaging on 12/2 without acute findings. Currently pending discharge to [facility].  Mentation continues to fluctuate throughout hospitalization. -Continue aripiprazole 5mg  daily, fluoxetine 40mg  daily, mirtazapine 7.5mg  nightly -Will need close follow up with outpatient psych  #DVT prophx: Enoxaparin 40mg  daily #Diet: Dysphagia 2 #Bowel regimen: Senna-docusate 2 tablets at bedtime #Code status: Full code #PT/OT recs: SNF; Rolling walker with 5"  wheels; 3in1  Prior to Admission Living Arrangement: Home Anticipated Discharge Location: SNF Barriers to Discharge: Placement Dispo: Anticipated discharge in approximately 3-4 day(s).   , MD Internal Medicine PGY-1 Pager: [phone] After 5pm on weekdays and 1pm on weekends: On Call pager [phone]  04/16/2020, 6:42 AM

## 2020-04-16 NOTE — Plan of Care (Signed)
°  Problem: Education: Goal: Knowledge of General Education information will improve Description: Including pain rating scale, medication(s)/side effects and non-pharmacologic comfort measures Outcome: Progressing   Problem: Clinical Measurements: Goal: Respiratory complications will improve Outcome: Progressing   Problem: Coping: Goal: Level of anxiety will decrease Outcome: Progressing   Problem: Elimination: Goal: Will not experience complications related to urinary retention Outcome: Progressing   Problem: Pain Managment: Goal: General experience of comfort will improve Outcome: Progressing   Problem: Safety: Goal: Ability to remain free from injury will improve Outcome: Progressing

## 2020-04-16 NOTE — Plan of Care (Signed)
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
## 2020-04-17 ENCOUNTER — Encounter (HOSPITAL_COMMUNITY): Payer: Self-pay | Admitting: Internal Medicine

## 2020-04-17 DIAGNOSIS — E512 Wernicke's encephalopathy: Secondary | ICD-10-CM | POA: Diagnosis not present

## 2020-04-17 DIAGNOSIS — G9340 Encephalopathy, unspecified: Secondary | ICD-10-CM | POA: Diagnosis not present

## 2020-04-17 DIAGNOSIS — F1020 Alcohol dependence, uncomplicated: Secondary | ICD-10-CM

## 2020-04-17 DIAGNOSIS — E43 Unspecified severe protein-calorie malnutrition: Secondary | ICD-10-CM | POA: Diagnosis not present

## 2020-04-17 DIAGNOSIS — Z515 Encounter for palliative care: Secondary | ICD-10-CM

## 2020-04-17 DIAGNOSIS — F332 Major depressive disorder, recurrent severe without psychotic features: Secondary | ICD-10-CM

## 2020-04-17 DIAGNOSIS — F202 Catatonic schizophrenia: Secondary | ICD-10-CM | POA: Diagnosis not present

## 2020-04-17 DIAGNOSIS — Z7189 Other specified counseling: Secondary | ICD-10-CM

## 2020-04-17 DIAGNOSIS — S02609A Fracture of mandible, unspecified, initial encounter for closed fracture: Secondary | ICD-10-CM | POA: Diagnosis not present

## 2020-04-17 DIAGNOSIS — F329 Major depressive disorder, single episode, unspecified: Secondary | ICD-10-CM | POA: Diagnosis not present

## 2020-04-17 LAB — GLUCOSE, CAPILLARY
Glucose-Capillary: 85 mg/dL (ref 70–99)
Glucose-Capillary: 75 mg/dL (ref 70–99)

## 2020-04-17 MED ORDER — OLANZAPINE 2.5 MG PO TABS
2.5000 mg | ORAL_TABLET | Freq: Every day | ORAL | Status: DC
  Administered 2020-04-17: 2.5 mg via ORAL
  Filled 2020-04-17: qty 1

## 2020-04-17 MED ORDER — CLONAZEPAM 0.5 MG PO TABS
0.5000 mg | ORAL_TABLET | Freq: Every day | ORAL | Status: DC
  Administered 2020-04-17: 0.5 mg via ORAL
  Filled 2020-04-17: qty 1
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
## 2020-04-17 NOTE — Plan of Care (Signed)
  Problem: Health Behavior/Discharge Planning: Goal: Ability to manage health-related needs will improve Outcome: Not Progressing   Problem: Activity: Goal: Risk for activity intolerance will decrease Outcome: Progressing   Problem: Nutrition: Goal: Adequate nutrition will be maintained Outcome: Not Progressing

## 2020-04-18 DIAGNOSIS — F329 Major depressive disorder, single episode, unspecified: Secondary | ICD-10-CM | POA: Diagnosis not present

## 2020-04-18 DIAGNOSIS — E512 Wernicke's encephalopathy: Secondary | ICD-10-CM | POA: Diagnosis not present

## 2020-04-18 DIAGNOSIS — F202 Catatonic schizophrenia: Secondary | ICD-10-CM | POA: Diagnosis not present

## 2020-04-18 DIAGNOSIS — E43 Unspecified severe protein-calorie malnutrition: Secondary | ICD-10-CM | POA: Diagnosis not present

## 2020-04-18 LAB — GLUCOSE, CAPILLARY
Glucose-Capillary: 69 mg/dL — ABNORMAL LOW (ref 70–99)
Glucose-Capillary: 76 mg/dL (ref 70–99)
Glucose-Capillary: 81 mg/dL (ref 70–99)
Glucose-Capillary: 83 mg/dL (ref 70–99)

## 2020-04-18 MED ORDER — ARIPIPRAZOLE 2 MG PO TABS
10.0000 mg | ORAL_TABLET | Freq: Every day | ORAL | Status: DC
  Administered 2020-04-18 – 2020-04-22 (×5): 10 mg via ORAL
  Filled 2020-04-18 (×6): qty 5

## 2020-04-18 NOTE — Clinical Social Work Note (Signed)
TOC Supervisor received confirmation from attorney for interim guardianship hearing on April 27, 2020 at 10am.  [facility] Supervisor to be present and petition on behalf of patient.  Will update facility options once interim guardianship has been granted.  TOC team remains available for support and to assist with discharge planning needs.  [name], [place] [phone]
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
## 2020-04-19 DIAGNOSIS — F332 Major depressive disorder, recurrent severe without psychotic features: Secondary | ICD-10-CM | POA: Diagnosis not present

## 2020-04-19 DIAGNOSIS — S02609A Fracture of mandible, unspecified, initial encounter for closed fracture: Secondary | ICD-10-CM | POA: Diagnosis not present

## 2020-04-19 DIAGNOSIS — G9340 Encephalopathy, unspecified: Secondary | ICD-10-CM | POA: Diagnosis not present

## 2020-04-19 LAB — GLUCOSE, CAPILLARY
Glucose-Capillary: 104 mg/dL — ABNORMAL HIGH (ref 70–99)
Glucose-Capillary: 91 mg/dL (ref 70–99)
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
## 2020-04-19 NOTE — TOC Progression Note (Signed)
Transition of Care [facility]) - Progression Note    Patient Details  Name: Duard Spiewak MRN: [id number] Date of Birth: 1955-04-10  [facility] CM/SW Contact  Dellie Burns [place], [place] Phone Number: 04/19/2020, 12:53 PM  Clinical Narrative:   Sherron Monday to Revonda Standard with [facility] who reports they are still able to offer a bed with pending guardianship however they currently do not have bed availability and none is expected until next wed/thurs. [facility] Director [name] updated and will f/u with attorney re guardianship status. MD aware of current barriers to dc.   Dellie Burns, MSW, LCSW [phone] (coverage)       Expected Discharge Plan: Skilled Nursing Facility Barriers to Discharge: Homeless with medical needs  Expected Discharge Plan and Services Expected Discharge Plan: Skilled Nursing Facility   Discharge Planning Services: CM Consult Post Acute Care Choice: Skilled Nursing Facility Living arrangements for the past 2 months: Homeless                                       Social Determinants of Health (SDOH) Interventions    Readmission Risk Interventions Readmission Risk Prevention Plan 02/28/2020  Transportation Screening Complete  PCP or Specialist Appt within 5-7 Days Complete  Home Care Screening Complete  Medication Review (RN CM) Complete
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
## 2020-04-19 NOTE — Plan of Care (Signed)

## 2020-04-20 DIAGNOSIS — E43 Unspecified severe protein-calorie malnutrition: Secondary | ICD-10-CM | POA: Diagnosis not present

## 2020-04-20 DIAGNOSIS — F202 Catatonic schizophrenia: Secondary | ICD-10-CM | POA: Diagnosis not present

## 2020-04-20 DIAGNOSIS — R509 Fever, unspecified: Secondary | ICD-10-CM | POA: Diagnosis not present

## 2020-04-20 DIAGNOSIS — F329 Major depressive disorder, single episode, unspecified: Secondary | ICD-10-CM | POA: Diagnosis not present

## 2020-04-20 LAB — GLUCOSE, CAPILLARY
Glucose-Capillary: 106 mg/dL — ABNORMAL HIGH (ref 70–99)
Glucose-Capillary: 71 mg/dL (ref 70–99)
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
## 2020-04-20 NOTE — Plan of Care (Signed)
  Problem: Education: Goal: Knowledge of General Education information will improve Description: Including pain rating scale, medication(s)/side effects and non-pharmacologic comfort measures Outcome: Progressing   Problem: Clinical Measurements: Goal: Respiratory complications will improve Outcome: Progressing   Problem: Nutrition: Goal: Adequate nutrition will be maintained Outcome: Progressing   Problem: Coping: Goal: Level of anxiety will decrease Outcome: Progressing   Problem: Elimination: Goal: Will not experience complications related to urinary retention Outcome: Progressing   Problem: Pain Managment: Goal: General experience of comfort will improve Outcome: Progressing   Problem: Safety: Goal: Ability to remain free from injury will improve Outcome: Progressing   

## 2020-04-20 NOTE — Plan of Care (Signed)
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
## 2020-04-20 NOTE — Plan of Care (Signed)
  Problem: Coping: Goal: Level of anxiety will decrease Outcome: Progressing   Problem: Pain Managment: Goal: General experience of comfort will improve Outcome: Progressing   Problem: Skin Integrity: Goal: Risk for impaired skin integrity will decrease Outcome: Progressing   

## 2020-04-21 LAB — GLUCOSE, CAPILLARY
Glucose-Capillary: 156 mg/dL — ABNORMAL HIGH (ref 70–99)
Glucose-Capillary: 46 mg/dL — ABNORMAL LOW (ref 70–99)
Glucose-Capillary: 68 mg/dL — ABNORMAL LOW (ref 70–99)
Glucose-Capillary: 87 mg/dL (ref 70–99)

## 2020-04-21 MED ORDER — GLUCAGON HCL RDNA (DIAGNOSTIC) 1 MG IJ SOLR
INTRAMUSCULAR | Status: AC
  Filled 2020-04-21: qty 1

## 2020-04-21 MED ORDER — DEXTROSE 50 % IV SOLN
INTRAVENOUS | Status: AC
  Administered 2020-04-21: 50 mL
  Filled 2020-04-21: qty 50

## 2020-04-21 NOTE — Progress Notes (Signed)
Subjective:  Overnight, patient had hypoglycemia (46) which responded well to amp of D50.  This morning, patient reports that he feels well and denies any new complaints. He denies hunger, thirst, pain, or headache.  Objective:  Vital signs in last 24 hours: Vitals:   04/20/20 0835 04/20/20 1308 04/20/20 2001 04/21/20 0459  BP: (!) 129/94 (!) 112/95 (!) 132/98 126/90  Pulse: 71 62 67 60  Resp: 18 18 15 16   Temp: 98.2 F (36.8 C) 98.5 F (36.9 C) (!) 97.4 F (36.3 C) 97.8 F (36.6 C)  TempSrc: Oral Oral Oral Axillary  SpO2: 98% 98% 100% 98%  Weight:      Height:      On room air  Intake/Output Summary (Last 24 hours) at 04/21/2020 1030 Last data filed at 04/21/2020 0500 Gross per 24 hour  Intake 483 ml  Output 600 ml  Net -117 ml   Filed Weights   04/13/20 0500 04/19/20 0500 04/20/20 0500  Weight: 50.9 kg 50.6 kg 49.5 kg  Physical Exam Vitals reviewed.  Constitutional:      General: He is not in acute distress.    Appearance: He is ill-appearing.  HENT:     Mouth/Throat:     Mouth: Mucous membranes are moist.     Pharynx: Oropharynx is clear.  Cardiovascular:     Rate and Rhythm: Normal rate and regular rhythm.     Pulses: Normal pulses.     Heart sounds: Normal heart sounds.  Pulmonary:     Effort: Pulmonary effort is normal. No respiratory distress.  Psychiatric:     Comments: Flat affect   Labs in last 24 hours: Glucose 24h - 87, 156, 46, 71  Imaging in last 24 hours: No results found.  Assessment/Plan:  Principal Problem:   Current severe episode of major depressive disorder without psychotic features (HCC) Active Problems:   Mandibular fracture (HCC)   Protein-calorie malnutrition, severe   Alcohol use disorder, moderate, dependence (HCC)   Wernicke's encephalopathy  Kyvon Hu is a 65 year old male with past medical history significant for polysubstance abuse, unstable housing, prior facial trauma s/p craniotomy and vp shunt admitted on  11/18 for facial trauma s/p ORIF of L mandible and teeth extraction with hospital course complicated by severe encephalopathy in setting of depression, catatonia, mutism and malnutrition.  #Malnutrition, chronic Patient continues to have waxing and waning interest in eating and drinking. He has most often required full assistance for his meals, however he has ate some food independently on occassion. Most recent basic metabolic panel (04/16/20) was unremarkable for electrolyte derangements. -RD following, appreciate recommendations:  -Continue ensure TID, magic cup TID, hormel shake TID, MVI with minerals, feeding assistance with meals, discontinue prosource plus TID -SLP following, appreciate recommendations:  -Dysphagia 2 diet -Repeat BMP occasionally, IVF as needed   #Severe Depression, Catatonia and Mutism, active Unclear baseline but patient is much less active compared to prior to admission. Head imaging on 12/2 without acute findings. Psychiatry evaluated patient on 04/09/20 with recommendation to start antipsychotic but unable to assess patient further due to lack of participation. Patient has had some response to therapy with consideration that he may benefit from further adjustments to his medication regimen. -Continue aripiprazole 10mg  daily -Continue fluoxetine 40mg  daily, mirtazapine 7.5mg  nightly  #Disposition Patient currently awaiting guardianship as he lacks capacity and has no family contacts. Although patient is pending guardianship, he will be able to discharge to [facility] upon bed availability (possibly Wednesday or Thursday). -  Appreciate case manager/social worker support  #DVT prophx: Enoxaparin 40mg  daily #Diet: Dysphagia 2 #Bowel regimen: Senna-docusate 2 tablets at bedtime #Code status: Full code #PT/OT recs: SNF; Rolling walker with 5" wheels; 3in1   , MD Internal Medicine PGY-1 Pager: [phone] After 5pm on weekdays and 1pm on weekends:  On Call pager [phone]  04/21/2020, 10:30 AM

## 2020-04-21 NOTE — Progress Notes (Signed)
Hypoglycemic Event  CBG: 46  Treatment: 50% Dextrose IV Push, 81ml  Symptoms: None  Follow-up CBG: Time: 0058 CBG Result:156  Possible Reasons for Event: Poor PO intake  Comments/MD notified: Yes.    Sima Matas

## 2020-04-21 NOTE — Plan of Care (Signed)
  Problem: Coping: Goal: Level of anxiety will decrease Outcome: Progressing   Problem: Pain Managment: Goal: General experience of comfort will improve Outcome: Progressing   Problem: Safety: Goal: Ability to remain free from injury will improve Outcome: Progressing   Problem: Skin Integrity: Goal: Risk for impaired skin integrity will decrease Outcome: Progressing   

## 2020-04-22 ENCOUNTER — Encounter (HOSPITAL_COMMUNITY): Payer: Self-pay | Admitting: Internal Medicine

## 2020-04-22 DIAGNOSIS — G9340 Encephalopathy, unspecified: Secondary | ICD-10-CM | POA: Diagnosis not present

## 2020-04-22 DIAGNOSIS — S02609A Fracture of mandible, unspecified, initial encounter for closed fracture: Secondary | ICD-10-CM | POA: Diagnosis not present

## 2020-04-22 DIAGNOSIS — F332 Major depressive disorder, recurrent severe without psychotic features: Secondary | ICD-10-CM | POA: Diagnosis not present

## 2020-04-22 LAB — GLUCOSE, CAPILLARY
Glucose-Capillary: 103 mg/dL — ABNORMAL HIGH (ref 70–99)
Glucose-Capillary: 64 mg/dL — ABNORMAL LOW (ref 70–99)
Glucose-Capillary: 82 mg/dL (ref 70–99)
Glucose-Capillary: 88 mg/dL (ref 70–99)
Glucose-Capillary: 93 mg/dL (ref 70–99)
Glucose-Capillary: 95 mg/dL (ref 70–99)

## 2020-04-22 LAB — BASIC METABOLIC PANEL
Anion gap: 9 (ref 5–15)
BUN: 22 mg/dL (ref 8–23)
CO2: 23 mmol/L (ref 22–32)
Calcium: 8.4 mg/dL — ABNORMAL LOW (ref 8.9–10.3)
Chloride: 105 mmol/L (ref 98–111)
Creatinine, Ser: 0.83 mg/dL (ref 0.61–1.24)
GFR, Estimated: >60 mL/min (ref >60)
Glucose, Bld: 112 mg/dL — ABNORMAL HIGH (ref 70–99)
Potassium: 3.8 mmol/L (ref 3.5–5.1)
Sodium: 137 mmol/L (ref 135–145)

## 2020-04-22 LAB — CBC
HCT: 39.6 % (ref 39.0–52.0)
Hemoglobin: 12.9 g/dL — ABNORMAL LOW (ref 13.0–17.0)
MCH: 27.9 pg (ref 26.0–34.0)
MCHC: 32.6 g/dL (ref 30.0–36.0)
MCV: 85.7 fL (ref 80.0–100.0)
Platelets: 303 10*3/uL (ref 150–400)
RBC: 4.62 MIL/uL (ref 4.22–5.81)
RDW: 15.2 % (ref 11.5–15.5)
WBC: 3.3 10*3/uL — ABNORMAL LOW (ref 4.0–10.5)
nRBC: 0 % (ref 0.0–0.2)
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
## 2020-04-23 LAB — GLUCOSE, CAPILLARY
Glucose-Capillary: 89 mg/dL (ref 70–99)
Glucose-Capillary: 83 mg/dL (ref 70–99)

## 2020-04-23 MED ORDER — ARIPIPRAZOLE 5 MG PO TABS
15.0000 mg | ORAL_TABLET | Freq: Every day | ORAL | Status: DC
  Administered 2020-04-23 – 2020-04-26 (×2): 15 mg via ORAL
  Filled 2020-04-23 (×5): qty 1

## 2020-04-23 NOTE — Progress Notes (Signed)
PT Cancellation Note  Patient Details Name: Leonard Forbes MRN: [id number] DOB: 1955-09-07   Cancelled Treatment:    Reason Eval/Treat Not Completed: Other (comment) Pt not willing to work with therapy.  Would open eye but no other response.  Attempted to encourage/educate but pt continued to lay in bed and not respond.   Continue to progress as able. Anise Salvo, PT Acute Rehab Services Pager [phone] [facility] [phone]    Rayetta Humphrey 04/23/2020, 2:59 PM

## 2020-04-23 NOTE — Progress Notes (Signed)
Subjective:  Overnight, no acute events.  This morning, patient reports that he feels well and denies any new complaints or concerns. Patient is unable to provide further interval history.  Objective:  Vital signs in last 24 hours: Vitals:   04/21/20 1938 04/22/20 0407 04/22/20 0839 04/23/20 0600  BP: (!) 139/101 (!) 145/101 (!) 129/98 (!) 125/105  Pulse: 63 61 70 79  Resp: 15 15 16 18   Temp: (!) 97.5 F (36.4 C) (!) 97.4 F (36.3 C) 98.2 F (36.8 C) (!) 97 F (36.1 C)  TempSrc: Oral Oral Oral Axillary  SpO2: 98% 95% 95% 97%  Weight:      Height:      On room air  Intake/Output Summary (Last 24 hours) at 04/23/2020 0840 Last data filed at 04/22/2020 1500 Gross per 24 hour  Intake 240 ml  Output --  Net 240 ml   Filed Weights   04/13/20 0500 04/19/20 0500 04/20/20 0500  Weight: 50.9 kg 50.6 kg 49.5 kg  Physical Exam Vitals reviewed.  Constitutional:      General: He is not in acute distress.    Appearance: He is ill-appearing.  HENT:     Mouth/Throat:     Mouth: Mucous membranes are dry.     Pharynx: Oropharynx is clear.  Cardiovascular:     Rate and Rhythm: Normal rate and regular rhythm.     Pulses: Normal pulses.     Heart sounds: Normal heart sounds.  Pulmonary:     Effort: Pulmonary effort is normal. No respiratory distress.  Psychiatric:     Comments: Flat affect   Labs in last 24 hours: Glucose 89, 95, 88, 82  Imaging in last 24 hours: No results found.  Assessment/Plan:  Principal Problem:   Current severe episode of major depressive disorder without psychotic features (HCC) Active Problems:   Mandibular fracture (HCC)   Protein-calorie malnutrition, severe   Alcohol use disorder, moderate, dependence (HCC)   Wernicke's encephalopathy  Leonard Forbes is a 65 year old male with past medical history significant for polysubstance abuse, unstable housing, prior facial trauma s/p craniotomy and vp shunt admitted on 11/18 for facial trauma s/p  ORIF of L mandible and teeth extraction with hospital course complicated by severe encephalopathy in setting of depression, catatonia, mutism and malnutrition.  #Malnutrition, chronic #Hypoglycemia, recurrent Patient continues to have waxing and waning interest in eating and drinking. He continues to require full assistance for feedings. Most recent basic metabolic panel (01/16) was unremarkable for electrolyte derangements. -RD following, appreciate recommendations:  -Continue ensure TID, magic cup TID, hormel shake TID, MVI with minerals, feeding assistance with meals, discontinue prosource plus TID -SLP following, appreciate recommendations:  -Dysphagia 2 diet  #Severe Depression, Catatonia and Mutism, active Unclear baseline but patient is much less active compared to prior to admission. Head imaging on 12/2 without acute findings. Psychiatry declined inpatient psychiatric admission. Patient has had waxing and waning response to antipsychotic and antidepressant therapy. -Increase aripiprazole from 10mg  to 15mg  daily -Continue fluoxetine 40mg  daily, mirtazapine 7.5mg  nightly  #Disposition Patient still pending guardianship, however he will be able to discharge to [facility] upon bed availability (possibly 1/19-1/20). -Appreciate case manager/social worker support  #DVT prophx: Enoxaparin 40mg  daily #Diet: Dysphagia 2 #Bowel regimen: Senna-docusate 2 tablets at bedtime #Code status: Full code #PT/OT recs: SNF; Rolling walker with 5" wheels; 3in1   , MD Internal Medicine PGY-1 Pager: [phone] After 5pm on weekdays and 1pm on weekends: On Call pager [phone]  04/23/2020, 8:40 AM

## 2020-04-23 NOTE — Plan of Care (Signed)
  Problem: Pain Managment: Goal: General experience of comfort will improve Outcome: Progressing   Problem: Safety: Goal: Ability to remain free from injury will improve Outcome: Progressing   Problem: Skin Integrity: Goal: Risk for impaired skin integrity will decrease Outcome: Progressing   

## 2020-04-23 NOTE — Plan of Care (Signed)
  Problem: Education: Goal: Knowledge of General Education information will improve Description: Including pain rating scale, medication(s)/side effects and non-pharmacologic comfort measures Outcome: Progressing   Problem: Health Behavior/Discharge Planning: Goal: Ability to manage health-related needs will improve Outcome: Progressing   Problem: Activity: Goal: Risk for activity intolerance will decrease Outcome: Progressing   Problem: Nutrition: Goal: Adequate nutrition will be maintained Outcome: Progressing   Problem: Pain Managment: Goal: General experience of comfort will improve Outcome: Progressing   Problem: Safety: Goal: Ability to remain free from injury will improve Outcome: Progressing   Problem: Education: Goal: Knowledge of General Education information will improve Description: Including pain rating scale, medication(s)/side effects and non-pharmacologic comfort measures Outcome: Progressing   Problem: Health Behavior/Discharge Planning: Goal: Ability to manage health-related needs will improve Outcome: Progressing   Problem: Activity: Goal: Risk for activity intolerance will decrease Outcome: Progressing   Problem: Nutrition: Goal: Adequate nutrition will be maintained Outcome: Progressing   Problem: Pain Managment: Goal: General experience of comfort will improve Outcome: Progressing   Problem: Safety: Goal: Ability to remain free from injury will improve Outcome: Progressing
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
## 2020-04-24 LAB — GLUCOSE, CAPILLARY
Glucose-Capillary: 79 mg/dL (ref 70–99)
Glucose-Capillary: 81 mg/dL (ref 70–99)
Glucose-Capillary: 84 mg/dL (ref 70–99)
Glucose-Capillary: 85 mg/dL (ref 70–99)

## 2020-04-24 NOTE — Plan of Care (Signed)

## 2020-04-24 NOTE — Care Management Important Message (Signed)
Important Message  Patient Details  Name: Leonard Forbes MRN: [id number] Date of Birth: December 31, 1955   Medicare Important Message Given:  Yes - Important Message mailed due to current National Emergency  Verbal consent obtained due to current National Emergency  Relationship to patient: Self Contact Name: Jimmey Hengel Call Date: 04/24/20  Time: 1553 Phone: [phone] Outcome: No Answer/Busy Important Message mailed to: Patient address on file    Orson Aloe 04/24/2020, 3:54 PM
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
## 2020-04-24 NOTE — Progress Notes (Signed)
Patient refusing morning meds, did drink some ensure.
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
## 2020-04-25 DIAGNOSIS — G9349 Other encephalopathy: Secondary | ICD-10-CM | POA: Diagnosis not present

## 2020-04-25 DIAGNOSIS — E43 Unspecified severe protein-calorie malnutrition: Secondary | ICD-10-CM | POA: Diagnosis not present

## 2020-04-25 DIAGNOSIS — E162 Hypoglycemia, unspecified: Secondary | ICD-10-CM | POA: Diagnosis not present

## 2020-04-25 DIAGNOSIS — R64 Cachexia: Secondary | ICD-10-CM | POA: Diagnosis not present

## 2020-04-25 LAB — GLUCOSE, CAPILLARY
Glucose-Capillary: 90 mg/dL (ref 70–99)
Glucose-Capillary: 82 mg/dL (ref 70–99)

## 2020-04-25 MED ORDER — ENOXAPARIN SODIUM 30 MG/0.3ML ~~LOC~~ SOLN
30.0000 mg | SUBCUTANEOUS | Status: DC
  Administered 2020-04-25 – 2020-04-29 (×3): 30 mg via SUBCUTANEOUS
  Filled 2020-04-25 (×4): qty 0.3

## 2020-04-25 MED ORDER — LORAZEPAM 1 MG PO TABS
2.0000 mg | ORAL_TABLET | Freq: Once | ORAL | Status: AC
  Administered 2020-04-26: 2 mg via ORAL
  Filled 2020-04-25: qty 2

## 2020-04-25 NOTE — Plan of Care (Signed)
?  Problem: Activity: ?Goal: Risk for activity intolerance will decrease ?Outcome: Progressing ?  ?Problem: Safety: ?Goal: Ability to remain free from injury will improve ?Outcome: Progressing ?  ?Problem: Pain Managment: ?Goal: General experience of comfort will improve ?Outcome: Progressing ?  ?
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
## 2020-04-25 NOTE — Progress Notes (Signed)
Notified daughter patient received coumadin dose for today at the hospital already.

## 2020-04-26 ENCOUNTER — Encounter (HOSPITAL_COMMUNITY): Payer: Self-pay | Admitting: Internal Medicine

## 2020-04-26 DIAGNOSIS — E162 Hypoglycemia, unspecified: Secondary | ICD-10-CM | POA: Diagnosis not present

## 2020-04-26 DIAGNOSIS — R64 Cachexia: Secondary | ICD-10-CM | POA: Diagnosis not present

## 2020-04-26 DIAGNOSIS — E43 Unspecified severe protein-calorie malnutrition: Secondary | ICD-10-CM | POA: Diagnosis not present

## 2020-04-26 DIAGNOSIS — G9349 Other encephalopathy: Secondary | ICD-10-CM | POA: Diagnosis not present

## 2020-04-26 LAB — GLUCOSE, CAPILLARY
Glucose-Capillary: 69 mg/dL — ABNORMAL LOW (ref 70–99)
Glucose-Capillary: 87 mg/dL (ref 70–99)
Glucose-Capillary: 91 mg/dL (ref 70–99)

## 2020-04-26 MED ORDER — LORAZEPAM 1 MG PO TABS
2.0000 mg | ORAL_TABLET | Freq: Three times a day (TID) | ORAL | Status: DC
Start: 2020-04-26 — End: 2020-04-27
  Filled 2020-04-26: qty 2
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
## 2020-04-26 NOTE — Plan of Care (Signed)
  Problem: Clinical Measurements: Goal: Respiratory complications will improve Outcome: Progressing Goal: Cardiovascular complication will be avoided Outcome: Progressing   Problem: Activity: Goal: Risk for activity intolerance will decrease Outcome: Progressing   Problem: Nutrition: Goal: Adequate nutrition will be maintained Outcome: Progressing   Problem: Coping: Goal: Level of anxiety will decrease Outcome: Progressing   Problem: Elimination: Goal: Will not experience complications related to bowel motility Outcome: Progressing Goal: Will not experience complications related to urinary retention Outcome: Progressing   Problem: Pain Managment: Goal: General experience of comfort will improve Outcome: Progressing   Problem: Safety: Goal: Ability to remain free from injury will improve Outcome: Progressing   Problem: Skin Integrity: Goal: Risk for impaired skin integrity will decrease Outcome: Progressing   

## 2020-04-27 DIAGNOSIS — E162 Hypoglycemia, unspecified: Secondary | ICD-10-CM | POA: Diagnosis not present

## 2020-04-27 DIAGNOSIS — G9349 Other encephalopathy: Secondary | ICD-10-CM | POA: Diagnosis not present

## 2020-04-27 DIAGNOSIS — E43 Unspecified severe protein-calorie malnutrition: Secondary | ICD-10-CM | POA: Diagnosis not present

## 2020-04-27 DIAGNOSIS — R64 Cachexia: Secondary | ICD-10-CM | POA: Diagnosis not present

## 2020-04-27 LAB — GLUCOSE, CAPILLARY
Glucose-Capillary: 103 mg/dL — ABNORMAL HIGH (ref 70–99)
Glucose-Capillary: 81 mg/dL (ref 70–99)

## 2020-04-27 MED ORDER — ARIPIPRAZOLE 5 MG PO TABS
5.0000 mg | ORAL_TABLET | Freq: Every day | ORAL | Status: DC
  Administered 2020-04-29: 5 mg via ORAL
  Filled 2020-04-27 (×2): qty 1

## 2020-04-27 MED ORDER — LORAZEPAM 2 MG/ML IJ SOLN
1.0000 mg | Freq: Three times a day (TID) | INTRAMUSCULAR | Status: DC
  Administered 2020-04-27 – 2020-04-28 (×3): 1 mg via INTRAVENOUS
  Filled 2020-04-27 (×3): qty 1
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
## 2020-04-27 NOTE — Progress Notes (Signed)
Subjective:  Overnight, no acute events.  This morning, patient not interactive during interview and not verbally responsive to questioning.  Objective:  Vital signs in last 24 hours: Vitals:   04/26/20 0300 04/26/20 0846 04/26/20 2000 04/27/20 0742  BP: (!) 142/101 (!) 134/103 (!) 145/107 (!) 148/110  Pulse: 70 65 72 72  Resp: 16 18 16 16   Temp: 97.9 F (36.6 C) (!) 97.4 F (36.3 C) 97.9 F (36.6 C) 97.8 F (36.6 C)  TempSrc: Oral Oral Oral Oral  SpO2: 100% 99% 100% 100%  Weight:      Height:      On room air  Intake/Output Summary (Last 24 hours) at 04/27/2020 1230 Last data filed at 04/27/2020 1000 Gross per 24 hour  Intake 100 ml  Output -  Net 100 ml   Filed Weights   04/19/20 0500 04/20/20 0500 04/25/20 0500  Weight: 50.6 kg 49.5 kg 47.7 kg  Physical Exam Vitals reviewed.  Constitutional:      General: He is not in acute distress.    Appearance: He is ill-appearing.     Comments: Cachetic, ill-appearing male lying on right side in hospital bed  Pulmonary:     Effort: Pulmonary effort is normal. No respiratory distress.  Psychiatric:        Mood and Affect: Affect is flat.        Speech: He is noncommunicative.        Behavior: Behavior is slowed and withdrawn.   Labs in last 24 hours: Glucose 103, 87, 69  Imaging in last 24 hours: No results found.  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 Unfortunately, patient not consistently taking prescribed medications which may be contributing to his labile condition. Patient's current prescribed regimen includes fluoxetine 40mg  daily, mirtazapine 7.5mg  daily, aripiprazole 15mg  daily and lorazepam 2mg  by mouth three times daily. Patient would benefit from continuing trial of benzodiazepine administration for now via intravenous route to ensure consistent delivery. If patient is not to have appropriate response to benzodiazepine administration, catatonic depression is less likely and patient's current condition may be his new baseline. -Transition oral lorazepam to IV lorazepam 1mg  three times daily  -Consider titration up if improvement, discontinue if leads to over sedation -Decrease aripiprazole from 15mg  to 5mg  -Continue fluoxetine 40mg  daily, mirtazapine 7.5mg  nightly  #Malnutrition, chronic Patient continues to have waxing and waning interest in eating and drinking. He continues to require mostly full assistance for feedings. Weight on admission 53.1kg down to 47.7kg most recently. Most recent basic metabolic panel (01/16) was unremarkable for electrolyte derangements. -RD following, appreciate recommendations:  -Continue ensure TID, magic cup TID, hormel shake TID, MVI with minerals, feeding assistance with meals -SLP following, appreciate recommendations:  -Dysphagia 2 diet  #Disposition Patient still pending guardianship, however he may  be able to discharge to [facility] upon bed availability. Palliative care consulted with plans to continue to follow in outpatient setting. Patient has family, however they have been unable to be  reached despite multiple attempts and law enforcement involvement. -Appreciate case manager/social worker support -Palliative care to follow in outpatient setting  #DVT prophx: Continue enoxaparin 30mg  daily given low weight (patient not consistently taking, SCDs ordered) #Diet: Dysphagia 2 #Bowel regimen: Senna-docusate 2 tablets at bedtime #Code status: Full code #PT/OT recs: SNF; Supervision/Assistance - 24 hour  Rolling walker with 5" wheels versus wheelchair; 3in1   , MD Internal Medicine PGY-1 Pager: [phone] After 5pm on weekdays and 1pm on weekends: On Call pager [phone]  04/27/2020, 12:30 PM

## 2020-04-27 NOTE — Plan of Care (Signed)

## 2020-04-27 NOTE — Care Management Important Message (Signed)
Important Message  Patient Details  Name: Leonard Forbes MRN: [id number] Date of Birth: 02-Nov-1955   Medicare Important Message Given:  Yes     [name] 04/27/2020, 2:41 PM

## 2020-04-28 DIAGNOSIS — G9349 Other encephalopathy: Secondary | ICD-10-CM | POA: Diagnosis not present

## 2020-04-28 DIAGNOSIS — E43 Unspecified severe protein-calorie malnutrition: Secondary | ICD-10-CM | POA: Diagnosis not present

## 2020-04-28 DIAGNOSIS — E162 Hypoglycemia, unspecified: Secondary | ICD-10-CM | POA: Diagnosis not present

## 2020-04-28 DIAGNOSIS — R64 Cachexia: Secondary | ICD-10-CM | POA: Diagnosis not present

## 2020-04-28 LAB — GLUCOSE, CAPILLARY
Glucose-Capillary: 81 mg/dL (ref 70–99)
Glucose-Capillary: 88 mg/dL (ref 70–99)

## 2020-04-28 MED ORDER — MIRTAZAPINE 15 MG PO TABS
7.5000 mg | ORAL_TABLET | Freq: Every day | ORAL | Status: DC
  Administered 2020-04-29: 7.5 mg via ORAL
  Filled 2020-04-28: qty 1

## 2020-04-28 NOTE — Progress Notes (Signed)
Subjective:   Very somnolent on exam today. He received IV ativan this morning around 6am and nursing states he has been appearing very sleepy since starting this. He does rouse to physical stimuli.   Objective:  Vital signs in last 24 hours: Vitals:   04/28/20 0421 04/28/20 0820 04/28/20 0917 04/28/20 1229  BP: (!) 132/92 138/84 (!) 131/106 (!) 116/97  Pulse: 67 71 66 71  Resp: 15 17  15   Temp: (!) 97.5 F (36.4 C) (!) 97.4 F (36.3 C)    TempSrc: Oral Axillary    SpO2: 100% 100% 100%   Weight:      Height:       Constitution: somnolent, rouses to gentle physical stimuli  HENT: [place]/AT Cardio: regular rate, no LE edmea  Respiratory: saturating well on room air, non-labored breathing Neuro: does not respond to voice Skin: c/d/i   Assessment/Plan:  Principal Problem:   Current severe episode of major depressive disorder without psychotic features (HCC) Active Problems:   Mandibular fracture (HCC)   Protein-calorie malnutrition, severe   Alcohol use disorder, moderate, dependence (HCC)   Wernicke's encephalopathy  Wrigley Plasencia is a 65 year old male with past medical history significant for polysubstance abuse, unstable housing, 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 Trial of IV ativan tid yesterday, which shows improvement in catatonic depression has caused more depressive symptoms. With no consistent improvement, it is appearing more likely that this may be his new baseline. He has had very limited po intake and significant weight loss and  malnutrition. With his psychiatric history, he would be unlikely to do well with a cortrak as this is a temporizing measure, and there has been no significant improvement thus far.  His symptoms areis less likely and patient's current condition may be his new baseline. - stop ativan  - cont. abilify 5mg , fluoxetine 40 mg - hold remeron tonight, continue tomorrow  #Malnutrition, chronic Patient continues to have waxing and waning interest in eating and drinking. He continues to require mostly full assistance for feedings. Weight on admission 53.1kg down to 47.7kg most recently. Most recent basic metabolic panel (01/16) was unremarkable for electrolyte derangements. -RD following, appreciate recommendations:  -Continue ensure TID, magic cup TID, hormel shake TID, MVI with minerals, feeding assistance with meals -SLP following, appreciate recommendations:  -Dysphagia 2 diet  #Disposition Patient still pending guardianship, however he may be able to discharge to [facility] upon bed availability. Palliative care consulted with plans to continue to follow in outpatient setting. Patient has family, however they have been unable to be reached despite multiple attempts and law enforcement involvement. -Appreciate case manager/social worker support -Palliative care to follow in outpatient setting - with worsening malnutrition, will reach out to palliative for further discussions  #DVT prophx: Continue enoxaparin 30mg  daily given low weight (patient not consistently taking, SCDs ordered) #Diet: Dysphagia 2 #Bowel regimen: Senna-docusate 2 tablets at bedtime #Code status: Full code #PT/OT recs: SNF; Supervision/Assistance - 24 hour  Rolling walker with 5" wheels versus wheelchair; 3in1   Dispo: Anticipated discharge pending placement.   Leonard Forbes A, DO 04/28/2020, 1:59 PM Pager: [phone] After 5pm on weekdays and 1pm on weekends:  On Call Pager: [phone]

## 2020-04-28 NOTE — Plan of Care (Signed)
°  Problem: Education: °Goal: Knowledge of General Education information will improve °Description: Including pain rating scale, medication(s)/side effects and non-pharmacologic comfort measures °Outcome: Progressing °  °Problem: Health Behavior/Discharge Planning: °Goal: Ability to manage health-related needs will improve °Outcome: Progressing °  °Problem: Clinical Measurements: °Goal: Ability to maintain clinical measurements within normal limits will improve °Outcome: Progressing °  °

## 2020-04-28 NOTE — Plan of Care (Signed)

## 2020-04-29 LAB — GLUCOSE, CAPILLARY
Glucose-Capillary: 107 mg/dL — ABNORMAL HIGH (ref 70–99)
Glucose-Capillary: 67 mg/dL — ABNORMAL LOW (ref 70–99)
Glucose-Capillary: 80 mg/dL (ref 70–99)
Glucose-Capillary: 83 mg/dL (ref 70–99)

## 2020-04-29 NOTE — Plan of Care (Signed)
  Problem: Activity: Goal: Risk for activity intolerance will decrease Outcome: Progressing   Problem: Coping: Goal: Level of anxiety will decrease Outcome: Progressing   Problem: Pain Managment: Goal: General experience of comfort will improve Outcome: Progressing   Problem: Safety: Goal: Ability to remain free from injury will improve Outcome: Progressing   Problem: Skin Integrity: Goal: Risk for impaired skin integrity will decrease Outcome: Progressing   

## 2020-04-29 NOTE — Progress Notes (Signed)
Subjective:   Overnight, no acute events.  This morning, patient reports that he feels "fine" and denies any new complaints, concerns, questions or discomfort. He is not interested in taking his morning medications crushed in apple sauce, however he is interested in consuming Ensure.   Objective:  Vital signs in last 24 hours: Vitals:   04/28/20 0917 04/28/20 1229 04/28/20 1917 04/29/20 0352  BP: (!) 131/106 (!) 116/97 (!) 134/92 (!) 117/99  Pulse: 66 71 71 86  Resp:  15 15 15   Temp:   (!) 97.5 F (36.4 C) (!) 97.5 F (36.4 C)  TempSrc:   Oral Axillary  SpO2: 100%  96% 100%  Weight:      Height:      No intake or output data in the 24 hours ending 04/29/20 0648 Filed Weights   04/19/20 0500 04/20/20 0500 04/25/20 0500  Weight: 50.6 kg 49.5 kg 47.7 kg  Physical Exam Vitals and nursing note reviewed.  Constitutional:      General: He is not in acute distress.    Appearance: He is ill-appearing.     Comments: Cachetic elderly male laying on right side in inclined hospital bed refusing to open mouth for morning medications.   Cardiovascular:     Rate and Rhythm: Normal rate and regular rhythm.     Pulses: Normal pulses.     Heart sounds: Normal heart sounds.  Pulmonary:     Effort: Pulmonary effort is normal. No respiratory distress.  Neurological:     Mental Status: Mental status is at baseline.  Psychiatric:        Mood and Affect: Affect is flat.        Speech: He is noncommunicative. Speech is delayed.        Behavior: Behavior is slowed and withdrawn.   Labs in last 24 hours: Glucose: 83, 81  Imaging in last 24 hours: No results found.  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  #Encephalopathy, active #History of TBI and polysubstance abuse Patient is currently on day 33 of this hospitalization with no consistent improvement in mentation, oral intake, or participation in his medical care. Patient not taking medications consistently despite continued encouragement by nursing. Patient's condition has been resistant to adjustments to his antidepressants, antipsychotics and trial of benzodiazepines to treat possible catatonia. As patient continues to have no improvement, primary team will rediscuss case with palliative medicine to determine whether transitioning patient to comfort care is an appropriate option as patient lacks capacity and is currently awaiting guardianship hearing and discharge to SNF. -Continue aripiprazole 5mg  daily, continue fluoxetine 40mg  daily, continue mirtazapine 7.5mg  nightly -Discuss case with palliative medicine  #Malnutrition, chronic Patient continues to have waxing and waning interest in eating and drinking. He continues to require full assistance for feedings. Weight on admission 53.1kg down to 47.7kg most recently. Most recent basic metabolic panel (01/16) was unremarkable for electrolyte derangements. -RD following, appreciate recommendations:  -Continue ensure TID, magic cup TID, hormel  shake TID, MVI with minerals, feeding assistance with meals -SLP following, appreciate recommendations:  -Dysphagia 2 diet  #Disposition Patient still pending guardianship, however he may be able to discharge to [facility] upon bed availability. Palliative care consulted with plans to continue to follow in  outpatient setting. Patient has family, however they have been unable to be reached despite multiple attempts and law enforcement involvement. -Appreciate case manager/social worker support  #DVT prophx: Continue enoxaparin 30mg  daily given low weight (patient not consistently taking, SCDs ordered) #Diet: Dysphagia 2 #Bowel regimen: Senna-docusate 2 tablets at bedtime #Code status: Full code #PT/OT recs: SNF; Supervision/Assistance - 24 hour  Rolling walker with 5" wheels versus wheelchair; 3in1  #Dispo: Anticipated discharge pending placement.   , MD 04/29/2020, 6:47 AM Pager: [phone] After 5pm on weekdays and 1pm on weekends: On Call Pager: [phone]

## 2020-04-30 MED ORDER — BIOTENE DRY MOUTH MT LIQD: 15.0000 mL | OROMUCOSAL | Status: DC | PRN

## 2020-04-30 MED ORDER — HALOPERIDOL LACTATE 5 MG/ML IJ SOLN: 0.5000 mg | INTRAMUSCULAR | Status: DC | PRN

## 2020-04-30 MED ORDER — LORAZEPAM 2 MG/ML IJ SOLN: 1.0000 mg | INTRAMUSCULAR | Status: DC | PRN

## 2020-04-30 MED ORDER — LORAZEPAM 2 MG/ML PO CONC: 1.0000 mg | ORAL | Status: DC | PRN

## 2020-04-30 MED ORDER — HALOPERIDOL 0.5 MG PO TABS
0.5000 mg | ORAL_TABLET | ORAL | Status: DC | PRN
  Filled 2020-04-30: qty 1

## 2020-04-30 MED ORDER — GLYCOPYRROLATE 0.2 MG/ML IJ SOLN
0.2000 mg | INTRAMUSCULAR | Status: DC | PRN
  Filled 2020-04-30: qty 1

## 2020-04-30 MED ORDER — POLYVINYL ALCOHOL 1.4 % OP SOLN: 1.0000 [drp] | Freq: Four times a day (QID) | OPHTHALMIC | Status: DC | PRN

## 2020-04-30 MED ORDER — GLYCOPYRROLATE 1 MG PO TABS
1.0000 mg | ORAL_TABLET | ORAL | Status: DC | PRN
  Filled 2020-04-30: qty 1

## 2020-04-30 MED ORDER — MORPHINE SULFATE (CONCENTRATE) 10 MG/0.5ML PO SOLN
5.0000 mg | ORAL | Status: DC | PRN
  Administered 2020-05-15 (×2): 5 mg via SUBLINGUAL
  Filled 2020-04-30 (×3): qty 0.5

## 2020-04-30 MED ORDER — LORAZEPAM 1 MG PO TABS: 1.0000 mg | ORAL_TABLET | ORAL | Status: DC | PRN

## 2020-04-30 MED ORDER — HALOPERIDOL LACTATE 2 MG/ML PO CONC
0.5000 mg | ORAL | Status: DC | PRN
  Filled 2020-04-30: qty 0.3

## 2020-04-30 MED ORDER — MORPHINE SULFATE (CONCENTRATE) 10 MG/0.5ML PO SOLN
5.0000 mg | ORAL | Status: DC | PRN
  Administered 2020-05-13 – 2020-05-14 (×2): 5 mg via ORAL
  Filled 2020-04-30: qty 0.5

## 2020-04-30 NOTE — Progress Notes (Signed)
Subjective:   Overnight, no acute events.  This morning, patient not interacting verbally and not responsive to questions.  Objective:  Vital signs in last 24 hours: Vitals:   04/29/20 1500 04/29/20 1933 04/30/20 0412 04/30/20 0741  BP: 134/90 131/84 (!) 130/97 (!) 132/96  Pulse: 71 69 68 68  Resp:  15 15 15   Temp: 98.6 F (37 C) 97.9 F (36.6 C) 97.6 F (36.4 C) (!) 97.5 F (36.4 C)  TempSrc:  Oral Axillary Axillary  SpO2: 100% 100% 100% 100%  Weight:      Height:        Intake/Output Summary (Last 24 hours) at 04/30/2020 1413 Last data filed at 04/30/2020 0900 Gross per 24 hour  Intake 120 ml  Output -  Net 120 ml   Filed Weights   04/19/20 0500 04/20/20 0500 04/25/20 0500  Weight: 50.6 kg 49.5 kg 47.7 kg  Physical Exam Vitals and nursing note reviewed.  Constitutional:      General: He is not in acute distress.    Appearance: He is ill-appearing.     Comments: Cachetic elderly male laying on left side in inclined hospital bed not interacting with medical team  Pulmonary:     Effort: Pulmonary effort is normal. No respiratory distress.  Neurological:     Mental Status: Mental status is at baseline.  Psychiatric:        Mood and Affect: Affect is flat.        Speech: He is noncommunicative.        Behavior: Behavior is slowed and withdrawn.   Labs in last 24 hours: Glucose: 107, 80  Imaging in last 24 hours: No results found.  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  #Encephalopathy, active #History of TBI and polysubstance abuse Patient is currently on day 67 of this hospitalization with no improvement in mentation, oral intake, or participation in medical care. Patient not taking medications consistently despite crushing medications in pureed food with continued encouragement by nursing. Patient's condition has been entirely resistant to adjustments to his antidepressants, antipsychotics and trial of benzodiazepines to treat possible associated catatonia, depression or other associated psychiatric illness. Patient's current condition and malnutrition is incompatible with long-term survival and patient is experiencing significant failure to thrive. Extensive attempts have been made to reach out to patient's relatives without success including the involvement of law enforcement to attempt to contact patient's relatives. As patient has no reachable contacts and no legal guardian at this time, his primary team has discussed patient's case with palliative medicine and determined that patient has a "Right to a Natural Death." Patient to be transitioned to comfort care today. -Discontinue aripiprazole 5mg  daily, continue fluoxetine 40mg  daily, continue mirtazapine 7.5mg  nightly -Transition to comfort care (acetaminophen, biotene, glycopyrrolate, haloperidol, lorazepam, morphine) -Follow-up palliative medicine recommendations  #Malnutrition, chronic Patient continues to require full assistance for feedings and significant encouragement by nursing staff to have patient consume minimal amounts of food and liquids.  Despite support by staff, patient's weight on admission 53.1kg down to 47.7kg most recently. Most recent basic metabolic panel (01/16) was unremarkable for electrolyte derangements. -RD following, appreciate  recommendations:  -Continue ensure TID, magic cup TID, hormel shake TID, MVI with minerals, feeding assistance with meals -SLP following, appreciate recommendations:  -Dysphagia 2 diet  #Disposition Patient still pending guardianship, however he may be able to discharge to [facility] upon bed availability. Palliative care consulted with plans to continue to follow in outpatient setting. Patient has family, however they have been unable to be reached despite multiple attempts and law enforcement involvement. -Appreciate case manager/social worker support  #VTE ppx: Discontinue enoxaparin #Diet: Dysphagia 2 #Bowel regimen: Senna-docusate 2 tablets at bedtime #Code status: DNR, comfort care #PT/OT recs: SNF; Supervision/Assistance - 24 hour  Rolling walker with 5" wheels versus wheelchair; 3in1  #Dispo: Anticipated discharge pending placement.   Roylene Reason, MD 04/30/2020, 2:13 PM Pager: [phone] After 5pm on weekdays and 1pm on weekends: On Call Pager: [phone]

## 2020-04-30 NOTE — Plan of Care (Signed)
  Problem: Elimination: Goal: Will not experience complications related to bowel motility Outcome: Progressing Goal: Will not experience complications related to urinary retention Outcome: Progressing   Problem: Pain Managment: Goal: General experience of comfort will improve Outcome: Progressing   Problem: Nutrition: Goal: Adequate nutrition will be maintained Outcome: Progressing   Problem: Activity: Goal: Risk for activity intolerance will decrease Outcome: Progressing

## 2020-04-30 NOTE — Clinical Social Work Note (Addendum)
TOC Supervisor continuing to follow patient for guardianship and disposition planning needs.  Interim guardianship hearing took place on 1/21 in which an interim guardian was appointed on patient behalf Leonard Forbes [phone]) - contact updated on facesheet.  Permanency hearing is scheduled for June 08, 2020.  TOC Supervisor has left a message and awaiting a return call to further explore placement options and to sign necessary paperwork.  TOC supervisor has also contacted [facility] to determine bed availability and to see if offer still remains.  Per Financial Counsel[employer], Medicaid application submitted to [facility] on April 26, 2020 and facility is aware.  TOC remains available for support and to assist with discharge disposition.  [name], [place] [phone]

## 2020-05-01 DIAGNOSIS — Z515 Encounter for palliative care: Secondary | ICD-10-CM | POA: Diagnosis not present

## 2020-05-01 DIAGNOSIS — F331 Major depressive disorder, recurrent, moderate: Secondary | ICD-10-CM

## 2020-05-01 DIAGNOSIS — E512 Wernicke's encephalopathy: Secondary | ICD-10-CM | POA: Diagnosis not present

## 2020-05-01 DIAGNOSIS — F332 Major depressive disorder, recurrent severe without psychotic features: Secondary | ICD-10-CM | POA: Diagnosis not present

## 2020-05-01 DIAGNOSIS — R627 Adult failure to thrive: Secondary | ICD-10-CM | POA: Diagnosis present

## 2020-05-01 DIAGNOSIS — G9340 Encephalopathy, unspecified: Secondary | ICD-10-CM | POA: Diagnosis not present

## 2020-05-01 DIAGNOSIS — E43 Unspecified severe protein-calorie malnutrition: Secondary | ICD-10-CM | POA: Diagnosis not present

## 2020-05-01 DIAGNOSIS — S02609A Fracture of mandible, unspecified, initial encounter for closed fracture: Secondary | ICD-10-CM | POA: Diagnosis not present

## 2020-05-01 DIAGNOSIS — Z7189 Other specified counseling: Secondary | ICD-10-CM | POA: Diagnosis not present

## 2020-05-01 LAB — GLUCOSE, CAPILLARY: Glucose-Capillary: 98 mg/dL (ref 70–99)
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
## 2020-05-01 NOTE — Plan of Care (Signed)
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
## 2020-05-02 DIAGNOSIS — S02609A Fracture of mandible, unspecified, initial encounter for closed fracture: Secondary | ICD-10-CM | POA: Diagnosis not present

## 2020-05-02 DIAGNOSIS — F332 Major depressive disorder, recurrent severe without psychotic features: Secondary | ICD-10-CM | POA: Diagnosis not present

## 2020-05-02 DIAGNOSIS — F331 Major depressive disorder, recurrent, moderate: Secondary | ICD-10-CM | POA: Diagnosis not present

## 2020-05-02 DIAGNOSIS — E43 Unspecified severe protein-calorie malnutrition: Secondary | ICD-10-CM | POA: Diagnosis not present

## 2020-05-02 NOTE — Clinical Social Work Note (Signed)
TOC Supervisor was able to connect with [facility] and confirm that Leonard Forbes is patient interim guardian (contact information updated on facesheet).  She is ready and willing to sign patient into [facility] once bed available.  TOC supervisor spoke with Revonda Standard in admissions at [facility] who will have someone from the facility business office confirm Medicaid pending status, complete paperwork with interim guardian and admit patient once male bed available.  Patient will need LOG for admission until Medicaid has gone through - this has already been approved.  TOC supervisor remains available for support and to facilitate patient discharge needs once bed available.  [name], [place] [phone]
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
## 2020-05-03 ENCOUNTER — Encounter (HOSPITAL_COMMUNITY): Payer: Self-pay | Admitting: Internal Medicine

## 2020-05-03 DIAGNOSIS — F331 Major depressive disorder, recurrent, moderate: Secondary | ICD-10-CM | POA: Diagnosis not present

## 2020-05-03 DIAGNOSIS — F332 Major depressive disorder, recurrent severe without psychotic features: Secondary | ICD-10-CM | POA: Diagnosis not present

## 2020-05-03 DIAGNOSIS — E43 Unspecified severe protein-calorie malnutrition: Secondary | ICD-10-CM | POA: Diagnosis not present

## 2020-05-03 DIAGNOSIS — S02609A Fracture of mandible, unspecified, initial encounter for closed fracture: Secondary | ICD-10-CM | POA: Diagnosis not present

## 2020-05-03 LAB — GLUCOSE, CAPILLARY
Glucose-Capillary: 57 mg/dL — ABNORMAL LOW (ref 70–99)
Glucose-Capillary: 96 mg/dL (ref 70–99)
Glucose-Capillary: 59 mg/dL — ABNORMAL LOW (ref 70–99)

## 2020-05-03 LAB — SARS CORONAVIRUS 2 (TAT 6-24 HRS): SARS Coronavirus 2: POSITIVE — AB

## 2020-05-03 MED ORDER — GUAIFENESIN-DM 100-10 MG/5ML PO SYRP: 10.0000 mL | ORAL_SOLUTION | ORAL | Status: DC | PRN

## 2020-05-03 MED ORDER — DEXTROSE 50 % IV SOLN
12.5000 g | INTRAVENOUS | Status: AC
  Administered 2020-05-03: 12.5 g via INTRAVENOUS
  Filled 2020-05-03: qty 50

## 2020-05-03 MED ORDER — DEXTROSE 50 % IV SOLN
INTRAVENOUS | Status: AC
  Filled 2020-05-03: qty 50
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
## 2020-05-03 NOTE — TOC Progression Note (Addendum)
Transition of Care [facility]) - Progression Note    Patient Details  Name: Vonzell Lindblad MRN: [id number] Date of Birth: 09-13-1955  [facility]) CM/SW Contact  Jimmy Picket, [place] Phone Number:  05/03/2020, 1:43 PM  Clinical Narrative:     [facility] not [facility] today for this patient.   TOC to check back tomorrow.   Expected Discharge Plan: Skilled Nursing Facility Barriers to Discharge: Homeless with medical needs  Expected Discharge Plan and Services Expected Discharge Plan: Skilled Nursing Facility   Discharge Planning Services: CM Consult Post Acute Care Choice: Skilled Nursing Facility Living arrangements for the past 2 months: Homeless                                       Social Determinants of Health (SDOH) Interventions    Readmission Risk Interventions Readmission Risk Prevention Plan 02/28/2020  Transportation Screening Complete  PCP or Specialist Appt within 5-7 Days Complete  Home Care Screening Complete  Medication Review (RN CM) Complete   Jimmy Picket, Theresia Majors, [place] Clinical Social Worker [phone]

## 2020-05-03 NOTE — Progress Notes (Signed)
Hypoglycemic Event  CBG: 57  Treatment: 8 oz juice/soda  Symptoms: None  Follow-up CBG: Time: 0545 CBG Result: 96  Possible Reasons for Event: Poor PO intake  Comments/MD notified: Austin Miles MD    Margarita Sermons

## 2020-05-03 NOTE — Progress Notes (Signed)
Nutrition Brief Note  Chart reviewed. Pt now transitioning to comfort care. Per TOC notes, plan to d/c to SNF with hospice once bed is available. No further nutrition interventions warranted at this time.  Please re-consult as needed.   Levada Schilling, RD, LDN, CDCES Registered Dietitian II Certified Diabetes Care and Education Specialist Please refer to [facility] for RD and/or RD on-call/weekend/after hours pager

## 2020-05-03 NOTE — TOC Progression Note (Incomplete Revision)
Transition of Care (TOC) - Progression Note    Patient Details  Name: Leonard Forbes MRN: [id number] Date of Birth: 08/10/1955  Transition of Care (TOC) CM/SW Contact  Miranda  Gainey, LCSWA Phone Number:  05/03/2020, 1:43 PM  Clinical Narrative:     [facility] does not have a bed today for this patient.   TOC to check back tomorrow.   Expected Discharge Plan: Skilled Nursing Facility Barriers to Discharge: Homeless with medical needs  Expected Discharge Plan and Services Expected Discharge Plan: Skilled Nursing Facility   Discharge Planning Services: CM Consult Post Acute Care Choice: Skilled Nursing Facility Living arrangements for the past 2 months: Homeless                                       Social Determinants of Health (SDOH) Interventions    Readmission Risk Interventions Readmission Risk Prevention Plan 02/28/2020  Transportation Screening Complete  PCP or Specialist Appt within 5-7 Days Complete  Home Care Screening Complete  Medication Review (RN CM) Complete   Miranda Gainey, LCSWA, LCASA Clinical Social Worker [phone]  

## 2020-05-04 DIAGNOSIS — E43 Unspecified severe protein-calorie malnutrition: Secondary | ICD-10-CM | POA: Diagnosis not present

## 2020-05-04 DIAGNOSIS — S02609A Fracture of mandible, unspecified, initial encounter for closed fracture: Secondary | ICD-10-CM | POA: Diagnosis not present

## 2020-05-04 DIAGNOSIS — F332 Major depressive disorder, recurrent severe without psychotic features: Secondary | ICD-10-CM | POA: Diagnosis not present

## 2020-05-04 DIAGNOSIS — F331 Major depressive disorder, recurrent, moderate: Secondary | ICD-10-CM | POA: Diagnosis not present

## 2020-05-04 LAB — SARS CORONAVIRUS 2 BY RT PCR (HOSPITAL ORDER, PERFORMED IN ~~LOC~~ HOSPITAL LAB): SARS Coronavirus 2: POSITIVE — AB

## 2020-05-04 NOTE — Progress Notes (Signed)
Subjective:   Overnight, no acute events.  This morning, patient not verbally responsive to questions but does open eyes partially.  Objective:  Vital signs in last 24 hours: Vitals:   05/02/20 1900 05/03/20 0300 05/03/20 0700 05/03/20 2214  BP: (!) 126/97 130/82 130/84 (!) 117/96  Pulse: 79 67 67 62  Resp: 19 17 18 15   Temp: 97.8 F (36.6 C) 98.5 F (36.9 C) 98.4 F (36.9 C) 97.8 F (36.6 C)  TempSrc: Oral Axillary Axillary Axillary  SpO2: 97%  98%   Weight:      Height:      On room air  Intake/Output Summary (Last 24 hours) at 05/04/2020 1350 Last data filed at 05/03/2020 1700 Gross per 24 hour  Intake 0 ml  Output --  Net 0 ml   Filed Weights   04/19/20 0500 04/20/20 0500 04/25/20 0500  Weight: 50.6 kg 49.5 kg 47.7 kg  Physical Exam Vitals reviewed.  Constitutional:      General: He is not in acute distress.    Appearance: He is ill-appearing.     Comments: Cachetic elderly male supine in inclined hospital bed with bilateral soft mittens in place with arms flexed to head  HENT:     Mouth/Throat:     Mouth: Mucous membranes are dry.     Pharynx: Oropharynx is clear.  Pulmonary:     Effort: Pulmonary effort is normal. No respiratory distress.  Neurological:     Mental Status: Mental status is at baseline.  Psychiatric:        Mood and Affect: Affect is flat.        Speech: He is noncommunicative.        Behavior: Behavior is slowed and withdrawn.   Labs in last 24 hours: SARS Coronavirus 2 (TAT 6-24hrs) - positive SARS Coronavirus 2 by RT PCR - positive Glucose: 59  Imaging in last 24 hours: No results found.  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  #Severe cognitive impairment of multifactorial etiology, active Patient continues to have decline in his overall mentation with lessened interaction on examination over past three days. Patient is currently receiving comfort oriented care. Patient requires full assistance for self-care and oral intake by nursing. Patient continues to show limited to complete absence of interest in eating or drinking. -Palliative care following, appreciate recommendations -Continue comfort care measures -Discharge to SNF with hospice  #Malnutrition, chronic Continues to require full assistance for feedings without interest in eating or drinking. Wt 53.1kg down to 47.7kg on 04/25/20. Patient likely to have experienced progressive weight loss since last recording, however no daily weights as patient is comfort care. -Continue dysphagia 2 diet -Greatly appreciate support from nursing for feedings  #Positive SARS-CoV 2, active Patient tested positive for SARS-CoV2 on two unique tests yesterday and today. Patient currently  saturating well on room air with no noticeable symptoms on examination. -Airborne and contact precautions -Robitussin DM 12mL Q4H PRN  #VTE ppx: None #Diet: Dysphagia 2 #Bowel regimen: Senna-docusate 2 tablets at bedtime #Code status: DNR, comfort care #PT/OT recs: SNF with hospice  Rolling walker with 5" wheels versus wheelchair; 3in1  #Dispo: Anticipated discharge pending placement.   Roylene Reason,  MD 05/04/2020, 1:50 PM Pager: [phone] After 5pm on weekdays and 1pm on weekends: On Call Pager: [phone]

## 2020-05-04 NOTE — Plan of Care (Signed)
  Problem: Education: Goal: Knowledge of General Education information will improve Description: Including pain rating scale, medication(s)/side effects and non-pharmacologic comfort measures Outcome: Not Progressing   Problem: Health Behavior/Discharge Planning: Goal: Ability to manage health-related needs will improve Outcome: Not Progressing   Problem: Clinical Measurements: Goal: Ability to maintain clinical measurements within normal limits will improve Outcome: Not Progressing   Problem: Activity: Goal: Risk for activity intolerance will decrease Outcome: Not Progressing   Problem: Nutrition: Goal: Adequate nutrition will be maintained Outcome: Not Progressing   Problem: Safety: Goal: Ability to remain free from injury will improve Outcome: Not Progressing   Problem: Skin Integrity: Goal: Risk for impaired skin integrity will decrease Outcome: Not Progressing

## 2020-05-05 NOTE — Plan of Care (Signed)
  Problem: Pain Managment: Goal: General experience of comfort will improve Outcome: Progressing   Problem: Safety: Goal: Ability to remain free from injury will improve Outcome: Progressing   Problem: Skin Integrity: Goal: Risk for impaired skin integrity will decrease Outcome: Progressing   

## 2020-05-05 NOTE — Progress Notes (Signed)
   Subjective:   Opens eyes and localizes to voice. Only makes noncommital noise to some questions although it sounds like he is saying no to pain or being hungry.   Objective:  Vital signs in last 24 hours: Vitals:   05/03/20 0300 05/03/20 0700 05/03/20 2214 05/05/20 0300  BP: 130/82 130/84 (!) 117/96 (!) 180/88  Pulse: 67 67 62 82  Resp: 17 18 15 18   Temp: 98.5 F (36.9 C) 98.4 F (36.9 C) 97.8 F (36.6 C) 98 F (36.7 C)  TempSrc: Axillary Axillary Axillary Axillary  SpO2:  98%  98%  Weight:      Height:       Constitution: NAD, cachectic appearing  HENT: [place]/AT Eyes: no icterus or injection  Respiratory: on room air, non-labored breathing  Neuro: opens eyes and localizes to voice, non-responsive Skin: c/d/i   Assessment/Plan:  Principal Problem:   Current severe episode of major depressive disorder without psychotic features (HCC) Active Problems:   Mandibular fracture (HCC)   Protein-calorie malnutrition, severe   Alcohol use disorder, moderate, dependence (HCC)   Wernicke's encephalopathy   Failure to thrive in adult   Advanced care planning/counseling discussion   Comfort measures only status   Palliative care by specialist  is a 65 year old male with past medical history significant for polysubstance abuse, unstable housing, 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  #Severe cognitive impairment of multifactorial etiology, active Patient continues to have decline in his overall mentation with lessened interaction on examination this past week. Patient is currently receiving comfort oriented care after extensive workup and attempted treatment. Patient requires full assistance for self-care and oral intake by nursing. Patient  continues to show limited to complete absence of interest in eating or drinking. -Palliative care following, appreciate recommendations -Continue comfort care measures -Discharge to SNF with hospice  #Malnutrition, chronic Continues to require full assistance for feedings without interest in eating or drinking. Wt 53.1kg down to 47.7kg on 04/25/20. Patient likely to have experienced progressive weight loss since last recording, however no daily weights as patient is comfort care. -Continue dysphagia 2 diet -Greatly appreciate support from nursing for feedings  #Positive SARS-CoV 2, active Patient tested positive for SARS-CoV2 on two unique tests this past week. Patient currently saturating well on room air with no noticeable symptoms on examination. -Airborne and contact precautions -Robitussin DM 90mL Q4H PRN  #VTE ppx: None #Diet: Dysphagia 2 #Bowel regimen: Senna-docusate 2 tablets at bedtime #Code status: DNR, comfort care #PT/OT recs: SNF with hospice  Rolling walker with 5" wheels versus wheelchair; 3in1  #Dispo: Anticipated discharge pending placement.   Stefana Lodico A, DO 05/05/2020, 7:27 AM Pager: [phone] After 5pm on weekdays and 1pm on weekends: On Call Pager: [phone]

## 2020-05-06 DIAGNOSIS — E440 Moderate protein-calorie malnutrition: Secondary | ICD-10-CM

## 2020-05-06 DIAGNOSIS — U071 COVID-19: Secondary | ICD-10-CM

## 2020-05-06 DIAGNOSIS — F332 Major depressive disorder, recurrent severe without psychotic features: Secondary | ICD-10-CM | POA: Diagnosis not present

## 2020-05-06 NOTE — Plan of Care (Signed)
  Problem: Pain Managment: Goal: General experience of comfort will improve Outcome: Progressing   Problem: Safety: Goal: Ability to remain free from injury will improve Outcome: Progressing   

## 2020-05-06 NOTE — Plan of Care (Signed)
  Problem: Pain Managment: Goal: General experience of comfort will improve 05/06/2020 0755 by Penelope Galas, RN Outcome: Progressing 05/06/2020 0754 by Penelope Galas, RN Outcome: Progressing   Problem: Safety: Goal: Ability to remain free from injury will improve 05/06/2020 0755 by Penelope Galas, RN Outcome: Progressing 05/06/2020 0754 by Penelope Galas, RN Outcome: Progressing   Problem: Elimination: Goal: Will not experience complications related to bowel motility 05/06/2020 0755 by Penelope Galas, RN Outcome: Progressing 05/06/2020 0754 by Penelope Galas, RN Outcome: Progressing Goal: Will not experience complications related to urinary retention 05/06/2020 0755 by Penelope Galas, RN Outcome: Progressing 05/06/2020 0754 by Penelope Galas, RN Outcome: Progressing   Problem: Nutrition: Goal: Adequate nutrition will be maintained 05/06/2020 0755 by Penelope Galas, RN Outcome: Progressing 05/06/2020 0754 by Penelope Galas, RN Outcome: Progressing

## 2020-05-06 NOTE — Progress Notes (Signed)
   Subjective:   Lying in bed with eyes open, localizes to voice. Does not respond verbally to voice or questions today. Foley placed overnight.   Objective:  Vital signs in last 24 hours: Vitals:   05/03/20 0700 05/03/20 2214 05/05/20 0300 05/05/20 1900  BP: 130/84 (!) 117/96 (!) 180/88 (!) 118/94  Pulse: 67 62 82 74  Resp: 18 15 18 17   Temp: 98.4 F (36.9 C) 97.8 F (36.6 C) 98 F (36.7 C) 98.4 F (36.9 C)  TempSrc: Axillary Axillary Axillary Axillary  SpO2: 98%  98% 95%  Weight:      Height:       Constitution: NAD, cachectic appearing, chronically ill appearing  HENT: /AT Eyes: no icterus or injection  Respiratory: on room air, non-labored breathing  Neuro: localizes to voice, non-responsive GU: foley in place  Skin: c/d/i   Assessment/Plan:  Principal Problem:   Current severe episode of major depressive disorder without psychotic features (HCC) Active Problems:   Mandibular fracture (HCC)   Protein-calorie malnutrition, severe   Alcohol use disorder, moderate, dependence (HCC)   Wernicke's encephalopathy   Failure to thrive in adult   Advanced care planning/counseling discussion   Comfort measures only status   Palliative care by specialist  is a 65 year old male with past medical history significant for polysubstance abuse, unstable housing, 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  #Severe cognitive impairment of multifactorial etiology, active Patient continues to have decline in his overall mentation with lessened interaction on examination this past week. Patient is currently receiving comfort oriented care after extensive workup and attempted treatment. Patient requires full assistance for self-care and oral intake by  nursing. Patient continues to show limited to complete absence of interest in eating or drinking. He is urinating on himself and has increased risk for skin breakdown as a result. -Palliative care following, appreciate recommendations -Continue comfort care measures -Discharge to SNF with hospice -cont foley catheter  #Malnutrition, chronic Continues to require full assistance for feedings without interest in eating or drinking. Wt 53.1kg down to 47.7kg on 04/25/20. Patient likely to have experienced progressive weight loss since last recording, however no daily weights as patient is comfort care. -Continue dysphagia 2 diet -Greatly appreciate support from nursing for feedings  #Positive SARS-CoV 2, active Patient tested positive for SARS-CoV2 on two unique tests this past week. Patient currently saturating well on room air with no noticeable symptoms on examination. -Airborne and contact precautions -Robitussin DM 81mL Q4H PRN  #VTE ppx: None #Diet: Dysphagia 2 #Bowel regimen: Senna-docusate 2 tablets at bedtime #Code status: DNR, comfort care #PT/OT recs: SNF with hospice  Rolling walker with 5" wheels versus wheelchair; 3in1  #Dispo: Anticipated discharge pending placement.   Tavis Kring A, DO 05/06/2020, 11:12 AM Pager: [phone] After 5pm on weekdays and 1pm on weekends: On Call Pager: [phone]

## 2020-05-06 NOTE — Progress Notes (Signed)
Placed 14 fr indwelling foley cath per order from on call [facility]- for end of life comfort care

## 2020-05-07 DIAGNOSIS — F332 Major depressive disorder, recurrent severe without psychotic features: Secondary | ICD-10-CM | POA: Diagnosis not present

## 2020-05-07 DIAGNOSIS — E440 Moderate protein-calorie malnutrition: Secondary | ICD-10-CM | POA: Diagnosis not present

## 2020-05-07 DIAGNOSIS — U071 COVID-19: Secondary | ICD-10-CM | POA: Diagnosis not present

## 2020-05-07 NOTE — Clinical Social Work Note (Signed)
TOC Supervisor received notification that patient has been approved for Medicaid.  Information shared with [facility] representative, Revonda Standard.  Also made facility aware of patient now COVID positive status and goal to transition to facility under comfort care.  Facility may have an available bed sooner than anticipated now that patient is COVID positive.  TOC Supervisor will update unit staff and MD when discharge can be facilitated.  [name], [place] [phone]

## 2020-05-07 NOTE — Progress Notes (Signed)
Subjective:   Overnight, no acute events.  This morning, patient responds with yes/no responses to questions. He requests the lights to be turned off in his room. He denies pain, hunger or thirst. He has no further questions or concerns.  Objective:  Vital signs in last 24 hours: Vitals:   05/03/20 2214 05/05/20 0300 05/05/20 1900 05/07/20 0900  BP: (!) 117/96 (!) 180/88 (!) 118/94 (!) 119/94  Pulse: 62 82 74 93  Resp: 15 18 17 18   Temp: 97.8 F (36.6 C) 98 F (36.7 C) 98.4 F (36.9 C) 97.9 F (36.6 C)  TempSrc: Axillary Axillary Axillary Oral  SpO2:  98% 95% 99%  Weight:      Height:        Intake/Output Summary (Last 24 hours) at 05/07/2020 1211 Last data filed at 05/07/2020 0900 Gross per 24 hour  Intake 240 ml  Output --  Net 240 ml   Filed Weights   04/19/20 0500 04/20/20 0500 04/25/20 0500  Weight: 50.6 kg 49.5 kg 47.7 kg  Physical Exam Vitals reviewed.  Constitutional:      Appearance: He is ill-appearing.  HENT:     Head:     Comments: Temporal wasting bilaterally    Mouth/Throat:     Comments: Dry mucous membranes Cardiovascular:     Rate and Rhythm: Normal rate and regular rhythm.     Pulses: Normal pulses.     Heart sounds: Normal heart sounds.  Pulmonary:     Effort: Pulmonary effort is normal. No respiratory distress.     Breath sounds: Normal breath sounds.  Abdominal:     General: Abdomen is flat. Bowel sounds are normal.     Palpations: Abdomen is soft.     Tenderness: There is no abdominal tenderness.  Skin:    Capillary Refill: Capillary refill takes more than 3 seconds.  Neurological:     Mental Status: Mental status is at baseline. He is disoriented.   Labs in last 24 hours: No new labs  Imaging in last 24 hours: No results found.  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  #Severe cognitive impairment of multifactorial etiology, active Patient continues to have decline in his overall mentation with lessened interaction on examination this past week. Patient is currently receiving comfort oriented care after extensive workup and attempted treatment. Patient requires full assistance for self-care and oral intake by nursing. Patient continues to show limited to complete absence of interest in eating or drinking. He is urinating on himself and has increased risk for skin breakdown as a result. -Palliative care following, appreciate recommendations -Continue comfort care measures -Discharge to SNF with hospice -Continue foley catheter  #Malnutrition, chronic Continues to require full assistance for feedings without interest in eating or drinking. Wt 53.1kg down to 47.7kg on 04/25/20. Patient likely to have  experienced progressive weight loss since last recording, however no daily weights as patient is comfort care. -Continue dysphagia 2 diet -Greatly appreciate support from nursing for feedings if patient desires  #Positive SARS-CoV 2, active Patient tested positive for SARS-CoV2 on two separate tests. Patient currently saturating well on room air with no noticeable symptoms on examination. -Airborne and contact precautions -Robitussin DM 35mL Q4H  PRN  #VTE ppx: None #Diet: Dysphagia 2 #Bowel regimen: Senna-docusate 2 tablets at bedtime #Code status: DNR, comfort care #PT/OT recs: SNF with hospice  Rolling walker with 5" wheels versus wheelchair; 3in1  #Dispo: Anticipated discharge pending placement.   Roylene Reason, MD 05/07/2020, 12:11 PM Pager: [phone] After 5pm on weekdays and 1pm on weekends: On Call Pager: [phone]

## 2020-05-07 NOTE — Plan of Care (Signed)
  Problem: Pain Managment: Goal: General experience of comfort will improve Outcome: Progressing   Problem: Safety: Goal: Ability to remain free from injury will improve Outcome: Progressing   Problem: Education: Goal: Knowledge of General Education information will improve Description: Including pain rating scale, medication(s)/side effects and non-pharmacologic comfort measures Outcome: Not Progressing   Problem: Health Behavior/Discharge Planning: Goal: Ability to manage health-related needs will improve Outcome: Not Progressing   Problem: Activity: Goal: Risk for activity intolerance will decrease Outcome: Not Progressing   Problem: Nutrition: Goal: Adequate nutrition will be maintained Outcome: Not Progressing   Problem: Skin Integrity: Goal: Risk for impaired skin integrity will decrease Outcome: Not Progressing

## 2020-05-08 DIAGNOSIS — G9340 Encephalopathy, unspecified: Secondary | ICD-10-CM | POA: Diagnosis not present

## 2020-05-08 DIAGNOSIS — E43 Unspecified severe protein-calorie malnutrition: Secondary | ICD-10-CM | POA: Diagnosis not present

## 2020-05-08 DIAGNOSIS — F1010 Alcohol abuse, uncomplicated: Secondary | ICD-10-CM | POA: Diagnosis not present

## 2020-05-08 DIAGNOSIS — S02609A Fracture of mandible, unspecified, initial encounter for closed fracture: Secondary | ICD-10-CM | POA: Diagnosis not present

## 2020-05-08 NOTE — Plan of Care (Signed)
?  Problem: Education: ?Goal: Knowledge of General Education information will improve ?Description: Including pain rating scale, medication(s)/side effects and non-pharmacologic comfort measures ?Outcome: Not Progressing ?  ?Problem: Health Behavior/Discharge Planning: ?Goal: Ability to manage health-related needs will improve ?Outcome: Not Progressing ?  ?Problem: Clinical Measurements: ?Goal: Ability to maintain clinical measurements within normal limits will improve ?Outcome: Not Progressing ?  ?Problem: Activity: ?Goal: Risk for activity intolerance will decrease ?Outcome: Not Progressing ?  ?Problem: Nutrition: ?Goal: Adequate nutrition will be maintained ?Outcome: Not Progressing ?  ?

## 2020-05-08 NOTE — Progress Notes (Addendum)
   Subjective:   Overnight, no acute events.  This morning, patient was nonverbal.   Objective:  Vital signs in last 24 hours: Vitals:   05/05/20 1900 05/07/20 0900 05/07/20 1500 05/07/20 2100  BP: (!) 118/94 (!) 119/94 (!) 124/100 (!) 111/98  Pulse: 74 93  92  Resp: 17 18  16   Temp: 98.4 F (36.9 C) 97.9 F (36.6 C) 97.8 F (36.6 C) 98.1 F (36.7 C)  TempSrc: Axillary Oral Oral Oral  SpO2: 95% 99%  98%  Weight:      Height:         Intake/Output Summary (Last 24 hours) at 05/08/2020 07/06/2020 Last data filed at 05/07/2020 1700 Gross per 24 hour  Intake 120 ml  Output --  Net 120 ml   Filed Weights   04/19/20 0500 04/20/20 0500 04/25/20 0500  Weight: 50.6 kg 49.5 kg 47.7 kg   Physical Exam Constitutional:      Appearance: He is ill-appearing.     Comments: Patient with eyes open occasional blinking, looking at the ceiling. Nonverbal. Malnourished.  Chronically ill appearing.   Cardiovascular:     Rate and Rhythm: Normal rate and regular rhythm.     Pulses: Normal pulses.     Heart sounds: Normal heart sounds.  Pulmonary:     Effort: Pulmonary effort is normal.     Breath sounds: Normal breath sounds. No wheezing, rhonchi or rales.  Abdominal:     General: Abdomen is flat. Bowel sounds are normal.     Tenderness: There is no abdominal tenderness.  Musculoskeletal:     Comments: Legs appear to be contracting     Labs in last 24 hours: No new labs  Imaging in last 24 hours: No results found.  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  #End of Life Care - Full comfort care    #Severe cognitive impairment of multifactorial etiology, active Patient is currently receiving comfort oriented care after extensive workup and attempted treatment. Currently looking for SNF options given new COVID status. -Palliative care following, appreciate recommendations -Continue comfort care measures -Discharge to SNF with hospice -Continue foley catheter   #Malnutrition, chronic -Continue dysphagia 2 diet -Greatly appreciate support from nursing for feedings if patient desires   #Positive SARS-CoV 2, active On room air. No indication for decadron or remdesivir.  -Airborne and contact precautions -Robitussin DM 51mL Q4H PRN   #VTE ppx: None #Diet: Dysphagia 2 #Bowel regimen: Senna-docusate 2 tablets at bedtime #Code status: DNR, comfort care #PT/OT recs: SNF with hospice  Rolling walker with 5" wheels versus wheelchair; 3in1  #Dispo: Anticipated discharge pending placement.   9m, MD 05/08/2020, 6:37 AM Pager: [phone] After 5pm on weekdays and 1pm on weekends: On Call Pager: [phone]

## 2020-05-09 DIAGNOSIS — G9340 Encephalopathy, unspecified: Secondary | ICD-10-CM | POA: Diagnosis not present

## 2020-05-09 DIAGNOSIS — F1010 Alcohol abuse, uncomplicated: Secondary | ICD-10-CM | POA: Diagnosis not present

## 2020-05-09 DIAGNOSIS — E43 Unspecified severe protein-calorie malnutrition: Secondary | ICD-10-CM | POA: Diagnosis not present

## 2020-05-09 DIAGNOSIS — S02609A Fracture of mandible, unspecified, initial encounter for closed fracture: Secondary | ICD-10-CM | POA: Diagnosis not present

## 2020-05-09 MED ORDER — GUAIFENESIN-DM 100-10 MG/5ML PO SYRP: 10.0000 mL | ORAL_SOLUTION | ORAL | 0 refills | Status: AC | PRN

## 2020-05-09 MED ORDER — LORAZEPAM 2 MG/ML PO CONC: 1.0000 mg | ORAL | 0 refills | Status: AC | PRN

## 2020-05-09 MED ORDER — BIOTENE DRY MOUTH MT LIQD: 15.0000 mL | OROMUCOSAL | 0 refills | Status: AC | PRN

## 2020-05-09 MED ORDER — HALOPERIDOL LACTATE 5 MG/ML IJ SOLN: 0.5000 mg | INTRAMUSCULAR | Status: DC | PRN

## 2020-05-09 MED ORDER — ACETAMINOPHEN 650 MG RE SUPP
650.0000 mg | Freq: Four times a day (QID) | RECTAL | 0 refills | Status: AC | PRN
Start: 2020-05-09 — End: ?

## 2020-05-09 MED ORDER — HALOPERIDOL LACTATE 2 MG/ML PO CONC: 0.5000 mg | ORAL | Status: DC | PRN

## 2020-05-09 MED ORDER — GLYCOPYRROLATE 0.2 MG/ML IJ SOLN: 0.2000 mg | INTRAMUSCULAR | Status: AC | PRN

## 2020-05-09 MED ORDER — ENSURE ENLIVE PO LIQD: 237.0000 mL | Freq: Three times a day (TID) | ORAL | 12 refills | Status: AC

## 2020-05-09 MED ORDER — LORAZEPAM 1 MG PO TABS: 1.0000 mg | ORAL_TABLET | ORAL | 0 refills | Status: AC | PRN

## 2020-05-09 MED ORDER — LORAZEPAM 2 MG/ML IJ SOLN: 1.0000 mg | INTRAMUSCULAR | 0 refills | Status: AC | PRN

## 2020-05-09 MED ORDER — ADULT MULTIVITAMIN W/MINERALS CH: 1.0000 | ORAL_TABLET | Freq: Every day | ORAL | 0 refills | Status: AC

## 2020-05-09 MED ORDER — ORAL CARE MOUTH RINSE: 15.0000 mL | Freq: Two times a day (BID) | OROMUCOSAL | 0 refills | Status: AC

## 2020-05-09 MED ORDER — POLYVINYL ALCOHOL 1.4 % OP SOLN: 1.0000 [drp] | Freq: Four times a day (QID) | OPHTHALMIC | 0 refills | Status: AC | PRN

## 2020-05-09 MED ORDER — ACETAMINOPHEN 325 MG PO TABS: 650.0000 mg | ORAL_TABLET | Freq: Four times a day (QID) | ORAL | 0 refills | Status: AC | PRN

## 2020-05-09 MED ORDER — HALOPERIDOL 0.5 MG PO TABS: 0.5000 mg | ORAL_TABLET | ORAL | 0 refills | Status: DC | PRN

## 2020-05-09 MED ORDER — GLYCOPYRROLATE 1 MG PO TABS: 1.0000 mg | ORAL_TABLET | ORAL | Status: AC | PRN

## 2020-05-09 NOTE — Progress Notes (Signed)
Internal Medicine Attending Note:  I have seen and evaluated this patient and I have discussed the plan of care with the house staff. Please see their note for complete details. I concur with their findings.  Reymundo Poll, MD 05/09/2020, 8:32 PM
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
## 2020-05-09 NOTE — TOC Progression Note (Signed)
Transition of Care [facility]) - Progression Note    Patient Details  Name: Leonard Forbes MRN: [id number] Date of Birth: July 10, 1955  [facility] CM/SW Contact  Lorri Frederick, LCSW Phone Number: 05/09/2020, 1:10 PM  Clinical Narrative:  CSW spoke with Revonda Standard at [facility].  Their intention was to bring pt in as rehab pt, since pt is comfort care they can still bring him in under medicaid, however, they need to speak to pt attorney in charge of his finances about getting copay prior to admission.  They will reach out to attorney and are not sure if this will happen today.       Expected Discharge Plan: Skilled Nursing Facility Barriers to Discharge: Homeless with medical needs  Expected Discharge Plan and Services Expected Discharge Plan: Skilled Nursing Facility   Discharge Planning Services: CM Consult Post Acute Care Choice: Skilled Nursing Facility Living arrangements for the past 2 months: Homeless Expected Discharge Date: 05/09/20                                     Social Determinants of Health (SDOH) Interventions    Readmission Risk Interventions Readmission Risk Prevention Plan 02/28/2020  Transportation Screening Complete  PCP or Specialist Appt within 5-7 Days Complete  Home Care Screening Complete  Medication Review (RN CM) Complete
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
## 2020-05-10 ENCOUNTER — Encounter (HOSPITAL_COMMUNITY): Payer: Self-pay | Admitting: Internal Medicine

## 2020-05-10 DIAGNOSIS — E43 Unspecified severe protein-calorie malnutrition: Secondary | ICD-10-CM | POA: Diagnosis not present

## 2020-05-10 DIAGNOSIS — F1010 Alcohol abuse, uncomplicated: Secondary | ICD-10-CM | POA: Diagnosis not present

## 2020-05-10 DIAGNOSIS — G9340 Encephalopathy, unspecified: Secondary | ICD-10-CM | POA: Diagnosis not present

## 2020-05-10 DIAGNOSIS — S02609A Fracture of mandible, unspecified, initial encounter for closed fracture: Secondary | ICD-10-CM | POA: Diagnosis not present

## 2020-05-10 NOTE — Plan of Care (Signed)

## 2020-05-10 NOTE — Care Management Important Message (Signed)
Important Message  Patient Details  Name: Leonard Forbes MRN: [id number] Date of Birth: 26-Jun-1955   Medicare Important Message Given:  Yes - Important Message mailed due to current National Emergency   Verbal consent obtained due to current National Emergency  Relationship to patient: Self Contact Name: Alexandre Faries Call Date: 05/10/20  Time: 1406 Phone: [phone] Outcome: No Answer/Busy Important Message mailed to: Patient address on file    Orson Aloe 05/10/2020, 2:06 PM

## 2020-05-10 NOTE — Progress Notes (Signed)
Internal Medicine Attending Note:  I have seen and evaluated this patient and I have discussed the plan of care with the house staff.   No overnight events. Still waiting for placement.   Blood pressure 112/90, pulse 100, temperature 98.7 F (37.1 C), temperature source Axillary, resp. rate 17, height 5\' 5"  (1.651 m), weight 47.7 kg, SpO2 99 %.  Exam:  Clinically unchanged. Remains contracted in bed. Opens eyes to voice and light touch but does not respond. Breathing comfortably, does not appear to be distressed.   65 yo M here with persistent multifactorial encephalopathy, severe cognitive impairment, refractory catatonic depression, and severe protein calorie malnutrition transitioned to full comfort care on 05/01/19. Course complicated by healthcare acquired COVID-19 infection on 1/27, still on isolation and airborne precautions.   Plan: - Continue end of life care / comfort measures  - SW consulted for placement    2/27, MD 05/10/2020, 5:45 PM

## 2020-05-11 DIAGNOSIS — Z66 Do not resuscitate: Secondary | ICD-10-CM

## 2020-05-11 DIAGNOSIS — U071 COVID-19: Secondary | ICD-10-CM | POA: Diagnosis not present

## 2020-05-11 DIAGNOSIS — R627 Adult failure to thrive: Secondary | ICD-10-CM | POA: Diagnosis not present

## 2020-05-11 NOTE — TOC Progression Note (Incomplete Revision)
Transition of Care [facility]) - Progression Note    Patient Details  Name: Leonard Forbes MRN: [id number] Date of Birth: [birth year]  Transition of Care [facility]) CM/SW Contact  Carley Hammed, [place] Phone Number:  05/11/2020, 3:52 PM  Clinical Narrative:    At this time, there is still no placement for this pt. SW will continue to follow.   Expected Discharge Plan: Skilled Nursing Facility Barriers to Discharge: Homeless with medical needs  Expected Discharge Plan and Services Expected Discharge Plan: Skilled Nursing Facility   Discharge Planning Services: CM Consult Post Acute Care Choice: Skilled Nursing Facility Living arrangements for the past 2 months: Homeless Expected Discharge Date: 05/09/20                                     Social Determinants of Health (SDOH) Interventions    Readmission Risk Interventions Readmission Risk Prevention Plan 02/28/2020  Transportation Screening Complete  PCP or Specialist Appt within 5-7 Days Complete  Home Care Screening Complete  Medication Review (RN CM) Complete

## 2020-05-11 NOTE — Plan of Care (Signed)
Patient on comfort care, in no apparent distress. Drank ensure, has poor appetite.No BM this shift, repositioned in the bed. Problem: Clinical Measurements: Goal: Ability to maintain clinical measurements within normal limits will improve Outcome: Progressing
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
## 2020-05-11 NOTE — TOC Progression Note (Addendum)
Transition of Care (TOC) - Progression Note    Patient Details  Name: Leonard Forbes MRN: [id number] Date of Birth: 08/12/1955  Transition of Care (TOC) CM/SW Contact  Hansen Carino, LCSWA Phone Number:  05/11/2020, 3:52 PM  Clinical Narrative:    At this time, there is still no placement for this pt. SW will continue to follow.   Expected Discharge Plan: Skilled Nursing Facility Barriers to Discharge: Homeless with medical needs  Expected Discharge Plan and Services Expected Discharge Plan: Skilled Nursing Facility   Discharge Planning Services: CM Consult Post Acute Care Choice: Skilled Nursing Facility Living arrangements for the past 2 months: Homeless Expected Discharge Date: 05/09/20                                     Social Determinants of Health (SDOH) Interventions    Readmission Risk Interventions Readmission Risk Prevention Plan 02/28/2020  Transportation Screening Complete  PCP or Specialist Appt within 5-7 Days Complete  Home Care Screening Complete  Medication Review (RN CM) Complete   

## 2020-05-11 NOTE — Plan of Care (Signed)
  Problem: Pain Managment: Goal: General experience of comfort will improve Outcome: Progressing   Problem: Safety: Goal: Ability to remain free from injury will improve Outcome: Progressing   Problem: Skin Integrity: Goal: Risk for impaired skin integrity will decrease Outcome: Progressing   

## 2020-05-12 DIAGNOSIS — U071 COVID-19: Secondary | ICD-10-CM | POA: Diagnosis not present

## 2020-05-12 DIAGNOSIS — Z66 Do not resuscitate: Secondary | ICD-10-CM | POA: Diagnosis not present

## 2020-05-12 DIAGNOSIS — R627 Adult failure to thrive: Secondary | ICD-10-CM | POA: Diagnosis not present
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
## 2020-05-14 DIAGNOSIS — E43 Unspecified severe protein-calorie malnutrition: Secondary | ICD-10-CM | POA: Diagnosis not present

## 2020-05-14 DIAGNOSIS — G9340 Encephalopathy, unspecified: Secondary | ICD-10-CM | POA: Diagnosis not present

## 2020-05-14 DIAGNOSIS — S02609A Fracture of mandible, unspecified, initial encounter for closed fracture: Secondary | ICD-10-CM | POA: Diagnosis not present

## 2020-05-14 DIAGNOSIS — F1010 Alcohol abuse, uncomplicated: Secondary | ICD-10-CM | POA: Diagnosis not present

## 2020-05-14 NOTE — Progress Notes (Addendum)
 NAME:  Leonard Forbes, MRN:  [id number], DOB:  10/02/1955, LOS: 81 ADMISSION DATE:  02/15/2020  Subjective  Patient evaluated at bedside this AM. Did not respond to questions.   Objective   Blood pressure (!) 128/101, pulse (!) 109, temperature 97.6 F (36.4 C), temperature source Oral, resp. rate 16, height 5' 5" (1.651 m), weight 47.7 kg, SpO2 96 %.    No intake or output data in the 24 hours ending 05/14/20 0606 Filed Weights   04/19/20 0500 04/20/20 0500 04/25/20 0500  Weight: 50.6 kg 49.5 kg 47.7 kg   Physical Exam: General: Laying in bed, no acute distress CV: Regular rate, rhythm. No m/r/g Pulm: Periods of tachypnea followed by short-acting apnea.  MSK: Temporal wasting present. Atrophied muscles.   Labs    CBC Latest Ref Rng & Units 04/22/2020 04/11/2020 04/05/2020  WBC 4.0 - 10.5 K/uL 3.3(L) 6.1 6.0  Hemoglobin 13.0 - 17.0 g/dL 12.9(L) 14.4 13.7  Hematocrit 39.0 - 52.0 % 39.6 48.1 45.2  Platelets 150 - 400 K/uL 303 332 411(H)   BMP Latest Ref Rng & Units 04/22/2020 04/16/2020 04/14/2020  Glucose 70 - 99 mg/dL 112(H) 84 96  BUN 8 - 23 mg/dL 22 25(H) 26(H)  Creatinine 0.61 - 1.24 mg/dL 0.83 0.93 0.94  Sodium 135 - 145 mmol/L 137 138 143  Potassium 3.5 - 5.1 mmol/L 3.8 3.9 3.5  Chloride 98 - 111 mmol/L 105 103 107  CO2 22 - 32 mmol/L 23 24 28  Calcium 8.9 - 10.3 mg/dL 8.4(L) 8.5(L) 8.3(L)   Consults:  Oral surgery (signed off) GI (signed off) Psychiatry (signed off) Palliative  Significant Diagnostic Tests:  CT maxillofacial 11/18 > 5mm L subdural hygroma and possible subacute hematoma without mass effect. Anterior mandibular fracture with mild anterior displacement of superior fragment. Fracture of posterior mandibular body on left, at the angle, with marked anterior displacement of proximal fragment and anterior dislocation of mandibular condyle relative to condylar fossa. Multiple cavities involving multiple teeth. Left facial subcutaneous edema/hemorrhage.  Mild to moderate diffuse cerebral and cerebellar atrophy.  Micro Data:  BCx 03/08/20 > negative COVID-19 05/03/20 > positive  Summary  Leonard Forbes is 64yo person with prior facial trauma s/p craniotomy, polysubstance abuse, unstable house admitted 11/18 for facial trauma s/p ORIF of L mandible and teeth extraction complicated by persistent, refractory encephalopathy unresponsive to multiple different medication regimens. He overall has a very poor prognosis and is now DNR, DNI and under comfort care measures only.  Assessment & Plan:  Principal Problem:   End of life care Active Problems:   Mandibular fracture (HCC)   Protein-calorie malnutrition, severe   Alcohol use disorder, moderate, dependence (HCC)   Wernicke's encephalopathy   Current severe episode of major depressive disorder without psychotic features (HCC)   Failure to thrive in adult   Comfort measures only status   Palliative care by specialist  #Severe cognitive impairment #Malnutrition #End of life care Patient currently waiting for hospice placement. Currently with comfort care orders. At bedside, patient has abnormal breathing, including tachypnea followed by periods of apnea. Likely nearing end of life. - No IV/lab sticks - C/w Foley catheter - Titrate morphine as needed to decrease respiratory symptoms.  #Asymptomatic COVID-19 No oxygen requirement. Comfort care measures only.  Best practice:  DIET: Dysphagia 2 IVF: n/a DVT PPX: n/a BOWEL: n/a CODE: DNR/DNI FAM COM: none  Phillip Braswell, MD Internal Medicine Resident PGY-1 PAGER: [phone] 05/14/2020 6:06 AM  If after hours (below), please contact   on-call pager: [phone] 5PM-7AM Monday-Friday 1PM-7AM Saturday-Sunday

## 2020-05-14 NOTE — Progress Notes (Incomplete Revision)
NAME:  Leonard Forbes, MRN:  [id number], DOB:  [birth year], LOS: 81 ADMISSION DATE:  02/09/2020  Subjective  Patient evaluated at bedside this AM. Did not respond to questions.   Objective   Blood pressure (!) 128/101, pulse (!) 109, temperature 97.6 F (36.4 C), temperature source Oral, resp. rate 16, height 5\' 5"  (1.651 m), weight 47.7 kg, SpO2 96 %.    No intake or output data in the 24 hours ending 05/14/20 0606 Filed Weights   04/19/20 0500 04/20/20 0500 04/25/20 0500  Weight: 50.6 kg 49.5 kg 47.7 kg   Physical Exam: General: Laying in bed, no acute distress CV: Regular rate, rhythm. No m/r/g Pulm: Periods of tachypnea followed by short-acting apnea.  MSK: Temporal wasting present. Atrophied muscles.   Labs    CBC Latest Ref Rng & Units 04/22/2020 04/11/2020 04/05/2020  WBC 4.0 - 10.5 K/uL 3.3(L) 6.1 6.0  Hemoglobin 13.0 - 17.0 g/dL 12.9(L) 14.4 13.7  Hematocrit 39.0 - 52.0 % 39.6 48.1 45.2  Platelets 150 - 400 K/uL 303 332 411(H)   BMP Latest Ref Rng & Units 04/22/2020 04/16/2020 04/14/2020  Glucose 70 - 99 mg/dL 06/12/2020) 84 96  BUN 8 - 23 mg/dL 22 284(X) 32(G)  Creatinine 0.61 - 1.24 mg/dL 40(N 0.27 2.53  Sodium 135 - 145 mmol/L 137 138 143  Potassium 3.5 - 5.1 mmol/L 3.8 3.9 3.5  Chloride 98 - 111 mmol/L 105 103 107  CO2 22 - 32 mmol/L 23 24 28   Calcium 8.9 - 10.3 mg/dL 6.64) ) 8.3(L)   Consults:  Oral surgery (signed off) GI (signed off) Psychiatry (signed off) Palliative  Significant Diagnostic Tests:  CT maxillofacial 11/18 > 22mm L subdural hygroma and possible subacute hematoma without mass effect. Anterior mandibular fracture with mild anterior displacement of superior fragment. Fracture of posterior mandibular body on left, at the angle, with marked anterior displacement of proximal fragment and anterior dislocation of mandibular condyle relative to condylar fossa. Multiple cavities involving multiple teeth. Left facial subcutaneous edema/hemorrhage.  Mild to moderate diffuse cerebral and cerebellar atrophy.  Micro Data:  BCx 03/08/20 > negative COVID-19 05/03/20 > positive  Summary  [name] is 64yo person with prior facial trauma s/p craniotomy, polysubstance abuse, unstable house admitted 11/18 for facial trauma s/p ORIF of L mandible and teeth extraction complicated by persistent, refractory encephalopathy unresponsive to multiple different medication regimens. He overall has a very poor prognosis and is now DNR, DNI and under comfort care measures only.  Assessment & Plan:  Principal Problem:   End of life care Active Problems:   Mandibular fracture (HCC)   Protein-calorie malnutrition, severe   Alcohol use disorder, moderate, dependence (HCC)   Wernicke's encephalopathy   Current severe episode of major depressive disorder without psychotic features (HCC)   Failure to thrive in adult   Comfort measures only status   Palliative care by specialist  #Severe cognitive impairment #Malnutrition #End of life care Patient currently waiting for hospice placement. Currently with comfort care orders. At bedside, patient has abnormal breathing, including tachypnea followed by periods of apnea. Likely nearing end of life. - No IV/lab sticks - C/w Foley catheter - Titrate morphine as needed to decrease respiratory symptoms.  #Asymptomatic COVID-19 No oxygen requirement. Comfort care measures only.  Best practice:  DIET: Dysphagia 2 IVF: n/a DVT PPX: n/a BOWEL: n/a CODE: DNR/DNI FAM COM: none  Paris Lore, MD Internal Medicine Resident PGY-1 PAGER: [phone] 05/14/2020 6:06 AM  If after hours (below), please contact  on-call pager: [phone] 5PM-7AM Monday-Friday 1PM-7AM Saturday-Sunday

## 2020-05-15 DIAGNOSIS — F1010 Alcohol abuse, uncomplicated: Secondary | ICD-10-CM | POA: Diagnosis not present

## 2020-05-15 DIAGNOSIS — G9340 Encephalopathy, unspecified: Secondary | ICD-10-CM | POA: Diagnosis not present

## 2020-05-15 DIAGNOSIS — E43 Unspecified severe protein-calorie malnutrition: Secondary | ICD-10-CM | POA: Diagnosis not present

## 2020-05-15 DIAGNOSIS — S02609A Fracture of mandible, unspecified, initial encounter for closed fracture: Secondary | ICD-10-CM | POA: Diagnosis not present

## 2020-05-15 MED ORDER — MORPHINE BOLUS VIA INFUSION
1.0000 mg | INTRAVENOUS | Status: DC | PRN
  Filled 2020-05-15: qty 1

## 2020-05-15 MED ORDER — MORPHINE 100MG IN NS 100ML (1MG/ML) PREMIX INFUSION
1.0000 mg/h | INTRAVENOUS | Status: DC
  Administered 2020-05-15: 1 mg/h via INTRAVENOUS
  Administered 2020-05-16: 3 mg/h via INTRAVENOUS
  Filled 2020-05-15: qty 100

## 2020-05-15 NOTE — TOC Progression Note (Addendum)
Transition of Care (TOC) - Progression Note    Patient Details  Name: Leonard Forbes MRN: [id number] Date of Birth: 04/08/1955  Transition of Care (TOC) CM/SW Contact  Marialuisa Basara W Ugochi Henzler, LCSWA Phone Number:  05/15/2020, 1:32 PM  Clinical Narrative:    CSW spoke with pt's physician this morning.  Pt will not discharge  to [facility] today.  Physician is expecting hospital death within 24-48 hours.    Expected Discharge Plan: Skilled Nursing Facility Barriers to Discharge: Homeless with medical needs  Expected Discharge Plan and Services Expected Discharge Plan: Skilled Nursing Facility   Discharge Planning Services: CM Consult Post Acute Care Choice: Skilled Nursing Facility Living arrangements for the past 2 months: Homeless Expected Discharge Date: 05/09/20                                     Social Determinants of Health (SDOH) Interventions    Readmission Risk Interventions Readmission Risk Prevention Plan 02/28/2020  Transportation Screening Complete  PCP or Specialist Appt within 5-7 Days Complete  Home Care Screening Complete  Medication Review (RN CM) Complete   

## 2020-05-15 NOTE — Progress Notes (Incomplete Revision)
   NAME:  Leonard Forbes, MRN:  [id number], DOB:  1955-10-11, LOS: 82 ADMISSION DATE:  Mar 07, 2020  Subjective  Patient evaluated at bedside this AM. Patient unresponsive to verbal stimuli.   Objective   Blood pressure (!) 125/106, pulse (!) 115, temperature 98 F (36.7 C), temperature source Oral, resp. rate 19, height 5\' 5"  (1.651 m), weight 47.7 kg, SpO2 95 %.    No intake or output data in the 24 hours ending 05/15/20 1204 Filed Weights   04/19/20 0500 04/20/20 0500 04/25/20 0500  Weight: 50.6 kg 49.5 kg 47.7 kg   Physical Exam: General: Cachectic, laying in bed Pulm: 40 seconds of apnea followed by hyperventilation.  Extremities: Bilateral lower extremities very cool to touch.  Labs    CBC Latest Ref Rng & Units 04/22/2020 04/11/2020 04/05/2020  WBC 4.0 - 10.5 K/uL 3.3(L) 6.1 6.0  Hemoglobin 13.0 - 17.0 g/dL 12.9(L) 14.4 13.7  Hematocrit 39.0 - 52.0 % 39.6 48.1 45.2  Platelets 150 - 400 K/uL 303 332 411(H)   BMP Latest Ref Rng & Units 04/22/2020 04/16/2020 04/14/2020  Glucose 70 - 99 mg/dL 06/12/2020) 84 96  BUN 8 - 23 mg/dL 22 431(V) 40(G)  Creatinine 0.61 - 1.24 mg/dL 86(P 6.19 5.09  Sodium 135 - 145 mmol/L 137 138 143  Potassium 3.5 - 5.1 mmol/L 3.8 3.9 3.5  Chloride 98 - 111 mmol/L 105 103 107  CO2 22 - 32 mmol/L 23 24 28   Calcium 8.9 - 10.3 mg/dL 3.26) ) 8.3(L)    Consults:  Oral surgery (signed off) GI (signed off) Psychiatry (signed off) Palliative  Significant Diagnostic Tests:  CT maxillofacial 11/18 > 46mm L subdural hygroma and possible subacute hematoma without mass effect. Anterior mandibular fracture with mild anterior displacement of superior fragment. Fracture of posterior mandibular body on left, at the angle, with marked anterior displacement of proximal fragment and anterior dislocation of mandibular condyle relative to condylar fossa. Multiple cavities involving multiple teeth. Left facial subcutaneous edema/hemorrhage. Mild to moderate diffuse  cerebral and cerebellar atrophy.  Micro Data:  BCx 03/08/20 > negative COVID-19 1/27/2 > positive  Summary  Leonard Forbes is 64yo person with prior facial trauma s/p craniotomy, polysubstance abuse, unstable housing admitted 11/18 for facial trauma now s/p ORIF L mandible and has since been complicated by persistent and refractory encephalopathy unresponsive to multiple medication regimens. Currently patient is actively dying and is under comfort care measures only.  Assessment & Plan:  Principal Problem:   End of life care Active Problems:   Mandibular fracture (HCC)   Protein-calorie malnutrition, severe   Alcohol use disorder, moderate, dependence (HCC)   Wernicke's encephalopathy   Current severe episode of major depressive disorder without psychotic features (HCC)   Failure to thrive in adult   Comfort measures only status   Palliative care by specialist  #Severe cognitive impairment #End of life care During evaluation, patient had 40 seconds of apnea followed by hyperventilation. Patient also has extremely cold extremities. It appears patient is in the process of actively dying. Will start morphine drip today and focus on comfort. - Start morphine gtt - No IV/lab sticks  Best practice:  DIET: Dysphagia 2 IVF: n/a DVT PPX: n/a BOWEL: n/a CODE: DNR/DNI FAM COM: n/a - Informed guardian Leonard Forbes of Leonard Forbes current condition.  Sander Radon, MD Internal Medicine Resident PGY-1 PAGER: [phone] 05/15/2020 12:04 PM  If after hours (below), please contact on-call pager: [phone] 5PM-7AM Monday-Friday 1PM-7AM Saturday-Sunday

## 2020-05-15 NOTE — Progress Notes (Addendum)
   NAME:  Sadat Edward Bran, MRN:  [id number], DOB:  05/20/1955, LOS: 82 ADMISSION DATE:  02/11/2020  Subjective  Patient evaluated at bedside this AM. Patient unresponsive to verbal stimuli.   Objective   Blood pressure (!) 125/106, pulse (!) 115, temperature 98 F (36.7 C), temperature source Oral, resp. rate 19, height 5' 5" (1.651 m), weight 47.7 kg, SpO2 95 %.    No intake or output data in the 24 hours ending 05/15/20 1204 Filed Weights   04/19/20 0500 04/20/20 0500 04/25/20 0500  Weight: 50.6 kg 49.5 kg 47.7 kg   Physical Exam: General: Cachectic, laying in bed Pulm: 40 seconds of apnea followed by hyperventilation.  Extremities: Bilateral lower extremities very cool to touch.  Labs    CBC Latest Ref Rng & Units 04/22/2020 04/11/2020 04/05/2020  WBC 4.0 - 10.5 K/uL 3.3(L) 6.1 6.0  Hemoglobin 13.0 - 17.0 g/dL 12.9(L) 14.4 13.7  Hematocrit 39.0 - 52.0 % 39.6 48.1 45.2  Platelets 150 - 400 K/uL 303 332 411(H)   BMP Latest Ref Rng & Units 04/22/2020 04/16/2020 04/14/2020  Glucose 70 - 99 mg/dL 112(H) 84 96  BUN 8 - 23 mg/dL 22 25(H) 26(H)  Creatinine 0.61 - 1.24 mg/dL 0.83 0.93 0.94  Sodium 135 - 145 mmol/L 137 138 143  Potassium 3.5 - 5.1 mmol/L 3.8 3.9 3.5  Chloride 98 - 111 mmol/L 105 103 107  CO2 22 - 32 mmol/L 23 24 28  Calcium 8.9 - 10.3 mg/dL 8.4(L) 8.5(L) 8.3(L)    Consults:  Oral surgery (signed off) GI (signed off) Psychiatry (signed off) Palliative  Significant Diagnostic Tests:  CT maxillofacial 11/18 > 5mm L subdural hygroma and possible subacute hematoma without mass effect. Anterior mandibular fracture with mild anterior displacement of superior fragment. Fracture of posterior mandibular body on left, at the angle, with marked anterior displacement of proximal fragment and anterior dislocation of mandibular condyle relative to condylar fossa. Multiple cavities involving multiple teeth. Left facial subcutaneous edema/hemorrhage. Mild to moderate diffuse  cerebral and cerebellar atrophy.  Micro Data:  BCx 03/08/20 > negative COVID-19 1/27/2 > positive  Summary  Mr. Marlin is 65yo person with prior facial trauma s/p craniotomy, polysubstance abuse, unstable housing admitted 11/18 for facial trauma now s/p ORIF L mandible and has since been complicated by persistent and refractory encephalopathy unresponsive to multiple medication regimens. Currently patient is actively dying and is under comfort care measures only.  Assessment & Plan:  Principal Problem:   End of life care Active Problems:   Mandibular fracture (HCC)   Protein-calorie malnutrition, severe   Alcohol use disorder, moderate, dependence (HCC)   Wernicke's encephalopathy   Current severe episode of major depressive disorder without psychotic features (HCC)   Failure to thrive in adult   Comfort measures only status   Palliative care by specialist  #Severe cognitive impairment #End of life care During evaluation, patient had 40 seconds of apnea followed by hyperventilation. Patient also has extremely cold extremities. It appears patient is in the process of actively dying. Will start morphine drip today and focus on comfort. - Start morphine gtt - No IV/lab sticks  Best practice:  DIET: Dysphagia 2 IVF: n/a DVT PPX: n/a BOWEL: n/a CODE: DNR/DNI FAM COM: n/a - Informed guardian Heather Hicks of Mr. Schedler's current condition.  Phillip Braswell, MD Internal Medicine Resident PGY-1 PAGER: [phone] 05/15/2020 12:04 PM  If after hours (below), please contact on-call pager: [phone] 5PM-7AM Monday-Friday 1PM-7AM Saturday-Sunday    

## 2020-05-15 NOTE — TOC Progression Note (Incomplete Revision)
Transition of Care [facility]) - Progression Note    Patient Details  Name: Leonard Forbes MRN: [id number] Date of Birth: 10-13-1955  Transition of Care [facility]) CM/SW Contact  Levada Schilling Phone Number:  05/15/2020, 1:32 PM  Clinical Narrative:    CSW spoke with pt's physician this morning.  Pt will not discharge  to [facility] today.  Physician is expecting hospital death within 24-48 hours.    Expected Discharge Plan: Skilled Nursing Facility Barriers to Discharge: Homeless with medical needs  Expected Discharge Plan and Services Expected Discharge Plan: Skilled Nursing Facility   Discharge Planning Services: CM Consult Post Acute Care Choice: Skilled Nursing Facility Living arrangements for the past 2 months: Homeless Expected Discharge Date: 05/09/20                                     Social Determinants of Health (SDOH) Interventions    Readmission Risk Interventions Readmission Risk Prevention Plan 02/28/2020  Transportation Screening Complete  PCP or Specialist Appt within 5-7 Days Complete  Home Care Screening Complete  Medication Review (RN CM) Complete

## 2020-05-15 NOTE — Care Plan (Signed)
Attempted to contact patient's guardian, Sander Radon, to update her regarding patient's condition. No answer. HIPPA compliant VM left.

## 2020-05-16 DIAGNOSIS — E43 Unspecified severe protein-calorie malnutrition: Secondary | ICD-10-CM | POA: Diagnosis not present

## 2020-05-16 DIAGNOSIS — G9340 Encephalopathy, unspecified: Secondary | ICD-10-CM | POA: Diagnosis not present

## 2020-05-16 DIAGNOSIS — F1010 Alcohol abuse, uncomplicated: Secondary | ICD-10-CM | POA: Diagnosis not present

## 2020-05-16 DIAGNOSIS — S02609A Fracture of mandible, unspecified, initial encounter for closed fracture: Secondary | ICD-10-CM | POA: Diagnosis not present

## 2020-05-16 NOTE — Progress Notes (Addendum)
NAME:  Leonard Forbes, MRN:  [id number], DOB:  09/18/1955, LOS: 83 ADMISSION DATE:  03/05/2020  Subjective  Patient evaluated at bedside this AM. Continues to be unresponsive.  Objective   Blood pressure (!) 116/103, pulse (!) 107, temperature 98.9 F (37.2 C), temperature source Axillary, resp. rate 19, height 5' 5" (1.651 m), weight 47.7 kg, SpO2 96 %.     Intake/Output Summary (Last 24 hours) at 05/16/2020 1241 Last data filed at 05/16/2020 0900 Gross per 24 hour  Intake 2.8 ml  Output --  Net 2.8 ml   Filed Weights   04/19/20 0500 04/20/20 0500 04/25/20 0500  Weight: 50.6 kg 49.5 kg 47.7 kg   Physical Exam: General: Ill-appearing. Laying in bed. Pulm: Tachypnea. No use of accessory muscles. Extremities: All extremities cool to touch.  Labs    CBC Latest Ref Rng & Units 04/22/2020 04/11/2020 04/05/2020  WBC 4.0 - 10.5 K/uL 3.3(L) 6.1 6.0  Hemoglobin 13.0 - 17.0 g/dL 12.9(L) 14.4 13.7  Hematocrit 39.0 - 52.0 % 39.6 48.1 45.2  Platelets 150 - 400 K/uL 303 332 411(H)   BMP Latest Ref Rng & Units 04/22/2020 04/16/2020 04/14/2020  Glucose 70 - 99 mg/dL 112(H) 84 96  BUN 8 - 23 mg/dL 22 25(H) 26(H)  Creatinine 0.61 - 1.24 mg/dL 0.83 0.93 0.94  Sodium 135 - 145 mmol/L 137 138 143  Potassium 3.5 - 5.1 mmol/L 3.8 3.9 3.5  Chloride 98 - 111 mmol/L 105 103 107  CO2 22 - 32 mmol/L 23 24 28  Calcium 8.9 - 10.3 mg/dL 8.4(L) 8.5(L) 8.3(L)   Consults:  Oral surgery (signed off) GI (signed off) Psychiatry (signed off) Palliative  Significant Diagnostic Tests:  CT maxillofacial 11/18 > 5mm L subdural hygroma and possible subacute hematoma without mass effect. Anterior mandibular fracture with mild anterior displacement of superior fragment. Fracture of posterior mandibular body on left, at the angle, with marked anterior displacement of proximal fragment and anterior dislocation of mandibular condyle relative to condylar fossa. Multiple cavities involving multiple teeth. Left facial  subcutaneous edema/hemorrhage. Mild to moderate diffuse cerebral and cerebellar atrophy.   Micro Data:  BCx 03/08/20 > negative COVID-19 1/27/2 > positive  Summary  Leonard Forbes is 65yo person with prior facial trauma s/p craniotomy, polysubstance abuse, unstable housing admitted 11/18 for facial trauma now s/p ORIG L mandible and has since been complicated by persistent and refractory encephalopathy unresponsive to multiple medications. He is currently in the active dying phase and has comfort care measures in place.  Assessment & Plan:  Principal Problem:   End of life care Active Problems:   Mandibular fracture (HCC)   Protein-calorie malnutrition, severe   Alcohol use disorder, moderate, dependence (HCC)   Wernicke's encephalopathy   Current severe episode of major depressive disorder without psychotic features (HCC)   Failure to thrive in adult   Comfort measures only status   Palliative care by specialist  #Severe cognitive impairment #End of life care Leonard Forbes on exam was tachypneic, no episodes of apnea during our time together this morning. Continues to have extremely cold extremities. Will continue titrating morphine drip for comfort as needed. - C/w morphine gtt, titrate as needed - No IV/lab sticks  Best practice:  DIET: Dysphagia 2 IVF: n/a DVT PPX: n/a BOWEL: n/a CODE: DNR/DNI FAM COM: n/a  Phillip Braswell, MD Internal Medicine Resident PGY-1 PAGER: [phone] 05/16/2020 12:41 PM  If after hours (below), please contact on-call pager: [phone] 5PM-7AM Monday-Friday 1PM-7AM Saturday-Sunday    

## 2020-05-16 NOTE — Progress Notes (Incomplete Revision)
NAME:  Leonard Forbes, MRN:  [id number], DOB:  03/15/1956, LOS: 83 ADMISSION DATE:  02/14/2020  Subjective  Patient evaluated at bedside this AM. Continues to be unresponsive.  Objective   Blood pressure (!) 116/103, pulse (!) 107, temperature 98.9 F (37.2 C), temperature source Axillary, resp. rate 19, height 5\' 5"  (1.651 m), weight 47.7 kg, SpO2 96 %.     Intake/Output Summary (Last 24 hours) at 05/16/2020 1241 Last data filed at 05/16/2020 0900 Gross per 24 hour  Intake 2.8 ml  Output --  Net 2.8 ml   Filed Weights   04/19/20 0500 04/20/20 0500 04/25/20 0500  Weight: 50.6 kg 49.5 kg 47.7 kg   Physical Exam: General: Ill-appearing. Laying in bed. Pulm: Tachypnea. No use of accessory muscles. Extremities: All extremities cool to touch.  Labs    CBC Latest Ref Rng & Units 04/22/2020 04/11/2020 04/05/2020  WBC 4.0 - 10.5 K/uL 3.3(L) 6.1 6.0  Hemoglobin 13.0 - 17.0 g/dL 12.9(L) 14.4 13.7  Hematocrit 39.0 - 52.0 % 39.6 48.1 45.2  Platelets 150 - 400 K/uL 303 332 411(H)   BMP Latest Ref Rng & Units 04/22/2020 04/16/2020 04/14/2020  Glucose 70 - 99 mg/dL 06/12/2020) 84 96  BUN 8 - 23 mg/dL 22 253(G) 64(Q)  Creatinine 0.61 - 1.24 mg/dL 03(K 7.42 5.95  Sodium 135 - 145 mmol/L 137 138 143  Potassium 3.5 - 5.1 mmol/L 3.8 3.9 3.5  Chloride 98 - 111 mmol/L 105 103 107  CO2 22 - 32 mmol/L 23 24 28   Calcium 8.9 - 10.3 mg/dL 6.38) ) 8.3(L)   Consults:  Oral surgery (signed off) GI (signed off) Psychiatry (signed off) Palliative  Significant Diagnostic Tests:  CT maxillofacial 11/18 > 19mm L subdural hygroma and possible subacute hematoma without mass effect. Anterior mandibular fracture with mild anterior displacement of superior fragment. Fracture of posterior mandibular body on left, at the angle, with marked anterior displacement of proximal fragment and anterior dislocation of mandibular condyle relative to condylar fossa. Multiple cavities involving multiple teeth. Left facial  subcutaneous edema/hemorrhage. Mild to moderate diffuse cerebral and cerebellar atrophy.   Micro Data:  BCx 03/08/20 > negative COVID-19 1/27/2 > positive  Summary  Leonard Forbes is 64yo person with prior facial trauma s/p craniotomy, polysubstance abuse, unstable housing admitted 11/18 for facial trauma now s/p ORIG L mandible and has since been complicated by persistent and refractory encephalopathy unresponsive to multiple medications. He is currently in the active dying phase and has comfort care measures in place.  Assessment & Plan:  Principal Problem:   End of life care Active Problems:   Mandibular fracture (HCC)   Protein-calorie malnutrition, severe   Alcohol use disorder, moderate, dependence (HCC)   Wernicke's encephalopathy   Current severe episode of major depressive disorder without psychotic features (HCC)   Failure to thrive in adult   Comfort measures only status   Palliative care by specialist  #Severe cognitive impairment #End of life care Leonard Forbes on exam was tachypneic, no episodes of apnea during our time together this morning. Continues to have extremely cold extremities. Will continue titrating morphine drip for comfort as needed. - C/w morphine gtt, titrate as needed - No IV/lab sticks  Best practice:  DIET: Dysphagia 2 IVF: n/a DVT PPX: n/a BOWEL: n/a CODE: DNR/DNI FAM COM: n/a  12/18, MD Internal Medicine Resident PGY-1 PAGER: [phone] 05/16/2020 12:41 PM  If after hours (below), please contact on-call pager: [phone] 5PM-7AM Monday-Friday 1PM-7AM Saturday-Sunday

## 2020-05-17 DIAGNOSIS — Z515 Encounter for palliative care: Secondary | ICD-10-CM | POA: Diagnosis not present

## 2020-05-17 DIAGNOSIS — S02609A Fracture of mandible, unspecified, initial encounter for closed fracture: Secondary | ICD-10-CM | POA: Diagnosis not present

## 2020-05-17 DIAGNOSIS — G9340 Encephalopathy, unspecified: Secondary | ICD-10-CM | POA: Diagnosis not present

## 2020-05-17 DIAGNOSIS — F1010 Alcohol abuse, uncomplicated: Secondary | ICD-10-CM | POA: Diagnosis not present

## 2020-05-17 NOTE — TOC Transition Note (Incomplete Revision)
Transition of Care [facility]) - CM/SW Discharge Note   Patient Details  Name: Leonard Forbes MRN: [id number] Date of Birth: May 01, 1955  Transition of Care [facility]) CM/SW Contact:  Leonard Forbes, LCSWA Phone Number:  05/19/2020, 2:49 PM   Clinical Narrative:    Nurse to call report to [phone].   Final next level of care: Hospice Medical Facility Barriers to Discharge: Barriers Resolved   Patient Goals and CMS Choice Patient states their goals for this hospitalization and ongoing recovery are:: Patient will be probable SNf placement - patient was homeless in [place], [place], current AMS, no reachable family members CMS Medicare.gov Compare Post Acute Care list provided to:: Other (Comment Required) (patient not oriented and unable to reach family) Choice offered to / list presented to : NA  Discharge Placement              Patient chooses bed at:  [facility]) Patient to be transferred to facility by: Safe transport Name of family member notified: Ermalene Postin Patient and family notified of of transfer: 05/18/2020  Discharge Plan and Services   Discharge Planning Services: CM Consult Post Acute Care Choice: Skilled Nursing Facility                               Social Determinants of Health (SDOH) Interventions     Readmission Risk Interventions Readmission Risk Prevention Plan 02/28/2020  Transportation Screening Complete  PCP or Specialist Appt within 5-7 Days Complete  Home Care Screening Complete  Medication Review (RN CM) Complete

## 2020-05-17 NOTE — TOC Transition Note (Addendum)
Transition of Care (TOC) - CM/SW Discharge Note   Patient Details  Name: Leonard Forbes MRN: [id number] Date of Birth: 02/28/1956  Transition of Care (TOC) CM/SW Contact:  Roselinda Bahena, LCSWA Phone Number:  05/24/2020, 2:49 PM   Clinical Narrative:    Nurse to call report to [phone].   Final next level of care: Hospice Medical Facility Barriers to Discharge: Barriers Resolved   Patient Goals and CMS Choice Patient states their goals for this hospitalization and ongoing recovery are:: Patient will be probable SNf placement - patient was homeless in [place], [place], current AMS, no reachable family members CMS Medicare.gov Compare Post Acute Care list provided to:: Other (Comment Required) (patient not oriented and unable to reach family) Choice offered to / list presented to : NA  Discharge Placement              Patient chooses bed at:  ([facility]) Patient to be transferred to facility by: Safe transport Name of family member notified: Heather Hicks Gaurdian Patient and family notified of of transfer: 06/01/2020  Discharge Plan and Services   Discharge Planning Services: CM Consult Post Acute Care Choice: Skilled Nursing Facility                               Social Determinants of Health (SDOH) Interventions     Readmission Risk Interventions Readmission Risk Prevention Plan 02/28/2020  Transportation Screening Complete  PCP or Specialist Appt within 5-7 Days Complete  Home Care Screening Complete  Medication Review (RN CM) Complete      

## 2020-05-17 NOTE — Progress Notes (Signed)
   Pt has been approved by Dr. Rozell Searing with [facility] for hospice services at the [facility]. I have spoke to [employer] pt's DSS guardian and she will have paperwork signed. Once we receive this back pt may be transported to our facility.   I have updated the SW Jess with transitions of care on the plan and will update her when paperwork received.   Thank you Norm Parcel RN [phone]

## 2020-05-17 NOTE — Progress Notes (Signed)
Report given to Cordelia Pen , staff nurse at [facility]. All questions and concerns were fully answered.

## 2020-05-17 NOTE — Progress Notes (Addendum)
Pt was found unresponsive, no pulse,no breathing,DNR /comfort care  And expired at 1810 and pronounced  by two nurses.  On call  MD, Sharrell Ku made aware attemped to call legal guardian and,left a voicemail to [employer] cellphone. [place] donors notified and not suitable for organ donor, Post mortem done.
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
## 2020-05-22 ENCOUNTER — Encounter: Payer: Self-pay | Admitting: Emergency Medicine
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
## 2020-06-05 DEATH — deceased
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
